# Patient Record
Sex: Female | Born: 1945
Health system: Southern US, Community
[De-identification: ages and names within clinical notes are randomized; demographics above are authoritative.]

## PROBLEM LIST (undated history)

## (undated) DIAGNOSIS — D869 Sarcoidosis, unspecified: Secondary | ICD-10-CM

## (undated) DIAGNOSIS — M1990 Unspecified osteoarthritis, unspecified site: Secondary | ICD-10-CM

## (undated) DIAGNOSIS — I2510 Atherosclerotic heart disease of native coronary artery without angina pectoris: Secondary | ICD-10-CM

## (undated) DIAGNOSIS — N289 Disorder of kidney and ureter, unspecified: Secondary | ICD-10-CM

## (undated) DIAGNOSIS — E119 Type 2 diabetes mellitus without complications: Secondary | ICD-10-CM

## (undated) DIAGNOSIS — M810 Age-related osteoporosis without current pathological fracture: Secondary | ICD-10-CM

## (undated) DIAGNOSIS — E785 Hyperlipidemia, unspecified: Secondary | ICD-10-CM

## (undated) DIAGNOSIS — I10 Essential (primary) hypertension: Secondary | ICD-10-CM

## (undated) HISTORY — DX: Hyperlipidemia, unspecified: E78.5

## (undated) HISTORY — PX: ANKLE SURGERY: SHX546

## (undated) HISTORY — DX: Essential (primary) hypertension: I10

## (undated) HISTORY — PX: BACK SURGERY: SHX140

## (undated) HISTORY — PX: FRACTURE SURGERY: SHX138

---

## 1997-12-20 ENCOUNTER — Ambulatory Visit (HOSPITAL_COMMUNITY): Admission: RE | Admit: 1997-12-20 | Discharge: 1997-12-20 | Payer: Self-pay | Admitting: Neurosurgery

## 1997-12-31 ENCOUNTER — Ambulatory Visit (HOSPITAL_COMMUNITY): Admission: RE | Admit: 1997-12-31 | Discharge: 1997-12-31 | Payer: Self-pay | Admitting: Neurosurgery

## 1998-01-28 ENCOUNTER — Inpatient Hospital Stay (HOSPITAL_COMMUNITY): Admission: RE | Admit: 1998-01-28 | Discharge: 1998-01-31 | Payer: Self-pay | Admitting: Neurosurgery

## 1998-02-09 ENCOUNTER — Emergency Department (HOSPITAL_COMMUNITY): Admission: EM | Admit: 1998-02-09 | Discharge: 1998-02-09 | Payer: Self-pay | Admitting: Emergency Medicine

## 1998-10-12 ENCOUNTER — Encounter: Payer: Self-pay | Admitting: Neurosurgery

## 1998-10-12 ENCOUNTER — Ambulatory Visit (HOSPITAL_COMMUNITY): Admission: RE | Admit: 1998-10-12 | Discharge: 1998-10-12 | Payer: Self-pay | Admitting: Neurosurgery

## 2002-04-20 ENCOUNTER — Ambulatory Visit (HOSPITAL_COMMUNITY): Admission: RE | Admit: 2002-04-20 | Discharge: 2002-04-20 | Payer: Self-pay | Admitting: Internal Medicine

## 2002-04-20 ENCOUNTER — Encounter: Payer: Self-pay | Admitting: Internal Medicine

## 2002-05-11 ENCOUNTER — Ambulatory Visit (HOSPITAL_COMMUNITY): Admission: RE | Admit: 2002-05-11 | Discharge: 2002-05-11 | Payer: Self-pay | Admitting: Orthopaedic Surgery

## 2002-05-11 ENCOUNTER — Encounter: Payer: Self-pay | Admitting: Orthopaedic Surgery

## 2002-07-02 ENCOUNTER — Encounter (HOSPITAL_COMMUNITY): Admission: RE | Admit: 2002-07-02 | Discharge: 2002-08-01 | Payer: Self-pay | Admitting: Orthopaedic Surgery

## 2003-07-19 ENCOUNTER — Ambulatory Visit (HOSPITAL_COMMUNITY): Admission: RE | Admit: 2003-07-19 | Discharge: 2003-07-19 | Payer: Self-pay | Admitting: Internal Medicine

## 2003-09-14 ENCOUNTER — Ambulatory Visit (HOSPITAL_COMMUNITY): Admission: RE | Admit: 2003-09-14 | Discharge: 2003-09-14 | Payer: Self-pay | Admitting: Internal Medicine

## 2004-04-20 ENCOUNTER — Inpatient Hospital Stay (HOSPITAL_COMMUNITY): Admission: EM | Admit: 2004-04-20 | Discharge: 2004-05-12 | Payer: Self-pay | Admitting: Emergency Medicine

## 2004-05-14 ENCOUNTER — Inpatient Hospital Stay (HOSPITAL_COMMUNITY): Admission: EM | Admit: 2004-05-14 | Discharge: 2004-05-19 | Payer: Self-pay

## 2004-09-22 ENCOUNTER — Other Ambulatory Visit: Admission: RE | Admit: 2004-09-22 | Discharge: 2004-09-22 | Payer: Self-pay | Admitting: Obstetrics and Gynecology

## 2004-10-05 ENCOUNTER — Ambulatory Visit (HOSPITAL_COMMUNITY): Admission: RE | Admit: 2004-10-05 | Discharge: 2004-10-05 | Payer: Self-pay | Admitting: Internal Medicine

## 2005-02-21 ENCOUNTER — Ambulatory Visit: Payer: Self-pay | Admitting: Internal Medicine

## 2005-03-05 ENCOUNTER — Ambulatory Visit (HOSPITAL_COMMUNITY): Admission: RE | Admit: 2005-03-05 | Discharge: 2005-03-05 | Payer: Self-pay | Admitting: Internal Medicine

## 2005-03-05 ENCOUNTER — Ambulatory Visit: Payer: Self-pay | Admitting: Internal Medicine

## 2005-08-28 ENCOUNTER — Inpatient Hospital Stay (HOSPITAL_COMMUNITY): Admission: EM | Admit: 2005-08-28 | Discharge: 2005-09-01 | Payer: Self-pay | Admitting: Emergency Medicine

## 2005-08-30 ENCOUNTER — Ambulatory Visit: Payer: Self-pay | Admitting: *Deleted

## 2005-08-31 ENCOUNTER — Encounter (INDEPENDENT_AMBULATORY_CARE_PROVIDER_SITE_OTHER): Payer: Self-pay | Admitting: *Deleted

## 2005-08-31 ENCOUNTER — Encounter: Payer: Self-pay | Admitting: Thoracic Surgery (Cardiothoracic Vascular Surgery)

## 2005-09-10 ENCOUNTER — Encounter
Admission: RE | Admit: 2005-09-10 | Discharge: 2005-09-10 | Payer: Self-pay | Admitting: Thoracic Surgery (Cardiothoracic Vascular Surgery)

## 2005-09-13 ENCOUNTER — Ambulatory Visit (HOSPITAL_COMMUNITY): Admission: RE | Admit: 2005-09-13 | Discharge: 2005-09-13 | Payer: Self-pay | Admitting: Internal Medicine

## 2005-10-09 ENCOUNTER — Ambulatory Visit (HOSPITAL_COMMUNITY): Admission: RE | Admit: 2005-10-09 | Discharge: 2005-10-09 | Payer: Self-pay | Admitting: Internal Medicine

## 2005-12-11 ENCOUNTER — Ambulatory Visit (HOSPITAL_COMMUNITY): Admission: RE | Admit: 2005-12-11 | Discharge: 2005-12-11 | Payer: Self-pay | Admitting: Pulmonary Disease

## 2005-12-13 ENCOUNTER — Ambulatory Visit (HOSPITAL_COMMUNITY): Admission: RE | Admit: 2005-12-13 | Discharge: 2005-12-13 | Payer: Self-pay | Admitting: Pulmonary Disease

## 2006-09-06 ENCOUNTER — Emergency Department (HOSPITAL_COMMUNITY): Admission: EM | Admit: 2006-09-06 | Discharge: 2006-09-06 | Payer: Self-pay | Admitting: Emergency Medicine

## 2006-09-13 ENCOUNTER — Emergency Department (HOSPITAL_COMMUNITY): Admission: EM | Admit: 2006-09-13 | Discharge: 2006-09-13 | Payer: Self-pay | Admitting: Emergency Medicine

## 2006-09-17 ENCOUNTER — Ambulatory Visit (HOSPITAL_BASED_OUTPATIENT_CLINIC_OR_DEPARTMENT_OTHER): Admission: RE | Admit: 2006-09-17 | Discharge: 2006-09-18 | Payer: Self-pay | Admitting: Orthopaedic Surgery

## 2006-10-01 ENCOUNTER — Ambulatory Visit (HOSPITAL_BASED_OUTPATIENT_CLINIC_OR_DEPARTMENT_OTHER): Admission: RE | Admit: 2006-10-01 | Discharge: 2006-10-02 | Payer: Self-pay | Admitting: Orthopaedic Surgery

## 2007-03-04 ENCOUNTER — Ambulatory Visit (HOSPITAL_COMMUNITY): Admission: RE | Admit: 2007-03-04 | Discharge: 2007-03-04 | Payer: Self-pay | Admitting: Pulmonary Disease

## 2007-06-02 ENCOUNTER — Other Ambulatory Visit: Admission: RE | Admit: 2007-06-02 | Discharge: 2007-06-02 | Payer: Self-pay | Admitting: Orthopaedic Surgery

## 2007-06-04 ENCOUNTER — Ambulatory Visit (HOSPITAL_COMMUNITY): Admission: RE | Admit: 2007-06-04 | Discharge: 2007-06-04 | Payer: Self-pay | Admitting: Obstetrics & Gynecology

## 2007-06-11 ENCOUNTER — Other Ambulatory Visit: Admission: RE | Admit: 2007-06-11 | Discharge: 2007-06-11 | Payer: Self-pay | Admitting: Obstetrics and Gynecology

## 2007-11-25 ENCOUNTER — Ambulatory Visit (HOSPITAL_COMMUNITY): Admission: RE | Admit: 2007-11-25 | Discharge: 2007-11-25 | Payer: Self-pay | Admitting: Pulmonary Disease

## 2008-03-04 ENCOUNTER — Ambulatory Visit (HOSPITAL_COMMUNITY): Admission: RE | Admit: 2008-03-04 | Discharge: 2008-03-04 | Payer: Self-pay | Admitting: Pulmonary Disease

## 2008-05-26 ENCOUNTER — Ambulatory Visit (HOSPITAL_COMMUNITY): Admission: RE | Admit: 2008-05-26 | Discharge: 2008-05-26 | Payer: Self-pay | Admitting: Pulmonary Disease

## 2008-06-22 ENCOUNTER — Other Ambulatory Visit: Admission: RE | Admit: 2008-06-22 | Discharge: 2008-06-22 | Payer: Self-pay | Admitting: Obstetrics and Gynecology

## 2009-04-26 ENCOUNTER — Ambulatory Visit (HOSPITAL_COMMUNITY): Admission: RE | Admit: 2009-04-26 | Discharge: 2009-04-26 | Payer: Self-pay | Admitting: Pulmonary Disease

## 2009-06-10 ENCOUNTER — Ambulatory Visit (HOSPITAL_COMMUNITY): Admission: RE | Admit: 2009-06-10 | Discharge: 2009-06-10 | Payer: Self-pay | Admitting: Pulmonary Disease

## 2009-06-17 ENCOUNTER — Ambulatory Visit (HOSPITAL_COMMUNITY): Admission: RE | Admit: 2009-06-17 | Discharge: 2009-06-17 | Payer: Self-pay | Admitting: Pulmonary Disease

## 2009-07-14 ENCOUNTER — Other Ambulatory Visit: Admission: RE | Admit: 2009-07-14 | Discharge: 2009-07-14 | Payer: Self-pay | Admitting: Obstetrics and Gynecology

## 2010-06-22 ENCOUNTER — Ambulatory Visit (HOSPITAL_COMMUNITY)
Admission: RE | Admit: 2010-06-22 | Discharge: 2010-06-22 | Payer: Self-pay | Source: Home / Self Care | Admitting: Pulmonary Disease

## 2010-09-16 ENCOUNTER — Encounter: Payer: Self-pay | Admitting: Pulmonary Disease

## 2010-11-30 LAB — BLOOD GAS, ARTERIAL
Acid-base deficit: 5.2 mmol/L — ABNORMAL HIGH (ref 0.0–2.0)
Bicarbonate: 19.2 mEq/L — ABNORMAL LOW (ref 20.0–24.0)
O2 Saturation: 96.2 %
Patient temperature: 37
TCO2: 17.6 mmol/L (ref 0–100)
pCO2 arterial: 34.5 mmHg — ABNORMAL LOW (ref 35.0–45.0)
pH, Arterial: 7.364 (ref 7.350–7.400)
pO2, Arterial: 86.2 mmHg (ref 80.0–100.0)

## 2011-01-12 NOTE — Procedures (Signed)
NAMENANCI, LAKATOS NO.:  [id number]   MEDICAL RECORD NO.:  [id number]          PATIENT TYPE:  OUT   LOCATION:  RESP                          FACILITY:  [facility]   PHYSICIAN:  Edward L. Juanetta Gosling, M.D.DATE OF BIRTH:  04-27-1946   DATE OF PROCEDURE:  DATE OF DISCHARGE:  12/13/2005                              PULMONARY FUNCTION TEST   1.  Spirometry shows a moderate ventilatory defect with evidence of airflow      obstruction.  2.  Lung volumes are normal.  3.  DLCO is normal.  4.  There is no significant bronchodilator response. Since the previous      pulmonary function test, lung volumes have decreased slightly although      they are still in the normal range.  She has had some reduction in the      FEV1 and FVC, although they are still approximately as previously, and      she has actually had an improvement in DLCO.      Edward L. Juanetta Gosling, M.D.  Electronically Signed     ELH/MEDQ  D:  12/15/2005  T:  12/17/2005  Job:  [id number]   cc:   Tesfaye D. [name], MD  Fax: [phone]

## 2011-01-12 NOTE — Discharge Summary (Signed)
[name], [name]                 ACCOUNT NO.:  [id number]   MEDICAL RECORD NO.:  [id number]          PATIENT TYPE:  INP   LOCATION:  A206                          FACILITY:  [facility]   PHYSICIAN:  Tesfaye D. [name], MD   DATE OF BIRTH:  Nov 27, 1945   DATE OF ADMISSION:  05/14/2004  DATE OF DISCHARGE:  09/23/2005LH                                 DISCHARGE SUMMARY   DISCHARGE DIAGNOSES:  1.  Postoperative nausea and vomiting, etiology not clear.  2.  Diarrhea.  3.  Acute on chronic renal failure.  4.  History of perforated duodenal ulcer and status post exploratory      laparotomy.  5.  History of sepsis.  6.  History of postoperative anemia.  7.  Hypertension.  8.  Diabetes mellitus.   DISCHARGE MEDICATIONS:  1.  Phenergan 25 mg suppository q.6h. p.r.n.  2.  Protonix 40 mg p.o. daily.  3.  Lortab 5/500 one tablet p.o. q.6h. p.r.n.  4.  Norvasc 5 mg p.o. daily.  5.  Avandia 4 mg p.o. daily.  6.  Glucotrol 2.5 mg p.o. daily.  7.  Xanax 0.5 mg p.o. b.i.d.  8.  Lisinopril/HCT 10/12.5 one tablet p.o. daily.   DISPOSITION:  Patient was discharged home in stable condition.   HOSPITAL COURSE:  This is a 65 year old female patient with a history of  multiple medical illnesses who was discharged recently from [facility].  There she had a complicated course following perforated duodenal  ulcer.  Was brought back due to nausea and vomiting and diarrhea of two  days' duration.  Patient was admitted and was started on IV fluids.  GI  consult was done and advised to continue on conservative treatment.  Her C.  difficile toxins became negative.  Patient was also seen by nephrologist for  her renal failure.  Patient was treated with symptomatic medications.  Over  the hospital stay her symptoms gradually improved and she was discharged  back home to continue her regular medications.     Tesf   TDF/MEDQ  D:  06/13/2004  T:  06/13/2004  Job:  [id number]

## 2011-01-12 NOTE — Consult Note (Signed)
[name], [name] NO.:  [id number]   MEDICAL RECORD NO.:  [id number]          PATIENT TYPE:  INP   LOCATION:  2023                         FACILITY:  [facility]   PHYSICIAN:  Salvatore Decent. Cornelius Moras, M.D. DATE OF BIRTH:  1946-03-30   DATE OF CONSULTATION:  08/30/2005  DATE OF DISCHARGE:                                   CONSULTATION   REQUESTING PHYSICIAN:  Dr. Kari Baars.   PRIMARY CARE PHYSICIAN:  Dr. Ninetta Lights D. Fanta.   REASON FOR CONSULTATION:  Mediastinal and bilateral hilar lymphadenopathy.   HISTORY OF PRESENT ILLNESS:  [name] is a 65 year old obese African-  American female from [place] with a history of hypertension, type 2  diabetes mellitus, mitral valve prolapse, peptic ulcer disease, and chronic  low back pain. The patient otherwise has no previous cardiac history and the  patient is a nonsmoker. She was admitted to [facility] on August 28, 2005 with a three-day history of worsening atypical chest pain. The  patient describes dull crushing substernal chest pain that she describes as  though an elephant is sitting on her chest. She states that the pain does  not seem to be related to physical activity or stress. The pain is not  associated with shortness of breath. The pain is worse by taking deep  breaths. The patient denies exertional shortness of breath and fatigue. The  patient denies productive cough or dry cough. She has not had hemoptysis.  She denies fever. The pain is only relieved in the patient's report by  morphine.   [name] was admitted to [facility] on August 28, 2005. Admission  set of cardiac enzymes and 12-lead electrocardiogram were reportedly  negative. She underwent chest CT scan that revealed a bulky mediastinal and  bilateral hilar lymphadenopathy. She also had a few tiny scattered bilateral  pulmonary nodules. There is some mild atelectasis in the right midlung but  otherwise no significant pulmonary  parenchymal disease. There were no  pleural effusions. Thoracic surgical consultation was requested to consider  mediastinoscopy for definitive biopsy. The patient was offered an outpatient  office appointment this afternoon, but due to continued symptoms of severe  chest pain, the patient was felt not to be stable enough to be discharge  from the hospital and instead was transferred from [facility] to  [facility] for further management and therapy.   REVIEW OF SYSTEMS:  GENERAL:  The patient reports normal appetite. She has  not been gaining or losing weight recently. CARDIAC:  The patient denies  exertional chest discomfort, chest pressure or chest pain or shortness of  breath. The chest pain that she describes recently has come on only over the  last five days and does not seem to be related to activity or stress. The  pain is made worse by deep inspiration. The pain is not associated with  difficulty swallowing or symptoms of reflux. The patient denies  palpitations, PND, orthopnea, or lower extremity edema. RESPIRATORY:  Notable for the absence of any cough either productive or nonproductive. The  patient  denies hemoptysis. The patient denies wheezing. The patient denies  recent exposure to any family members or acquaintances with unusual  respiratory illnesses or tuberculosis. GASTROINTESTINAL:  Negative. The  patient reports no ongoing difficulty swallowing. She has longstanding  history of reflux as well as history of a perforated peptic ulcer in 2005  for which she underwent exploratory abdominal surgery and developed severe  sepsis and required prolonged hospitalization to recover. The patient states  that her ongoing symptoms are not at all similar to the symptoms she  suffered at that time. The patient reports normal bowel function. She denies  recent hematochezia, hematemesis, melena. GENITOURINARY:  Negative.  MUSCULOSKELETAL:  Notable for chronic low back pain. The  patient states that  this pain has not changed in recent weeks or months. NEUROLOGIC:  Negative.  The patient denies symptoms suggestive of recent TIA or stroke. INFECTIOUS:  Negative. The patient denies recent fevers, chills. ENDOCRINE:  Notable for  type 2 diabetes mellitus. The patient claims check her sugars regularly and  that they have been under good control. She does not use insulin.  PSYCHIATRIC:  Negative.   PAST MEDICAL HISTORY:  1.  Hypertension.  2.  Type 2 diabetes mellitus.  3.  Status post perforated duodenal ulcer requiring exploratory laparotomy.  4.  Acute renal failure secondary to sepsis in the past.  5.  Mitral valve prolapse.  6.  Chronic low back pain.  7.  GE reflux disease.   PAST SURGICAL HISTORY:  Exploratory laparotomy for perforated duodenal ulcer  and/or esophagus according to the patient in 2005.   CURRENT MEDICATIONS:  1.  Protonix 40 milligrams daily.  2.  Norvasc 5 milligrams daily.  3.  Glipizide 5 milligrams daily.  4.  Avandia 4 milligrams daily.  5.  Reglan 10 milligrams three times daily before meals and also a fourth      time at bed time.  6.  Lisinopril/HCTZ 10/12.5, 1 tablet daily.  7.  Lyrica dose unclear.  8.  Lanoxin, dose unclear. In the patient's transcribed history and physical      from [facility], the patient reportedly takes Lanoxin 0.5      milligrams three times daily.   DRUG ALLERGIES:  The patient was told never to take ASPIRIN again secondary  to her perforated duodenal ulcer. The patient also apparently has  sensitivity to DARVOCET.   SOCIAL HISTORY:  The patient is retired and married and lives with her  husband in [place]. The patient denies any history of tobacco use. The  patient denies history of alcohol or other substance abuse.   FAMILY HISTORY:  Notable for the absence of any family members with history  of lung cancer, lymphoproliferative disorder, sarcoidosis, atypical infection. The patient  also denies any recent history of travel to foreign  countries or to the [place].   PHYSICAL EXAM:  The patient is well-appearing, mildly obese African-American  female who appears her stated age in no acute distress. Blood pressure is  142/88, temperature 99 degrees Fahrenheit, pulse 90. She is in sinus rhythm.  HEENT exam is grossly unremarkable. There is no palpable cervical or  supraclavicular lymphadenopathy. There is no jugular venous distension.  Auscultation of chest reveals clear and symmetrical breath sounds  bilaterally. No wheezes or rhonchi noted. Cardiovascular exam includes  regular rate and rhythm. No murmurs, rubs or gallops are noted. The abdomen  is soft and nontender. Bowel sounds are present. There are no palpable  masses.  Extremities are warm and well-perfused. There is no lower extremity  edema. Distal pulses are not palpable in either lower leg at the ankle.  There is no sign of significant venous insufficiency. Rectal and GU exams  are both deferred. Neurologic examination is grossly nonfocal and  symmetrical throughout.   DIAGNOSTIC TEST:  Chest CT scan performed August 28, 2005 at [facility] is reviewed. This demonstrates bulky mediastinal and bilateral  hilar lymphadenopathy. There are a few tiny scattered pulmonary nodules in  both lung fields. There are no pleural effusions. There is slight amount of  atelectasis in the right middle lung field. No other significant  abnormalities were noted. Abdominal and pelvis CT scan was also reviewed.  There is a tiny small lesion posteriorly in the liver which may be a tiny  hepatic cyst, although it is so small it is difficult to ascertain. No other  significant abnormalities are noted.   IMPRESSION:  1.  Bilateral hilar and mediastinal lymphadenopathy:  I am most suspicious      of possible sarcoidosis. Other possible diagnoses include lung cancer or      possible hematological  malignancy or lymphoproliferative disorder.      Perhaps less likely some type of atypical fungal infection or      mycobacterial infection.  2.  Atypical chest pain:  I do not believe this chest pain is cardiac in      origin. The chest pain seems to be disproportionate to the patient's      clinical condition. It may or may not be related to the underlying cause      of the lymphadenopathy.   PLAN:  I have discussed issues at length with Ms. Lessner and her husband. I  believe that bronchoscopy and mediastinoscopy would be the most reasonable  approach to proceed with definitive diagnostic intervention with respect to  the mediastinal lymphadenopathy. This will certainly in no way effect or  eliminate the symptoms of pain the patient is experiencing. I doubt that her  pain is cardiac in origin, but the patient certainly has multiple risk  factors and as well she will require general anesthetic for surgery.  I have discussed at length the indications, risks, and potential benefits of  bronchoscopy, mediastinoscopy with Ms. Savo and her husband. We tentatively  plan to proceed with surgery tomorrow. We will ask Dr. Dorethea Clan from the  [facility] Cardiology team to see her to make sure that he agrees that her  risks with a general anesthetic are acceptably low. The patient and her  husband accept all associated risks of surgery including but not limited to  risk of death, stroke, myocardial infarction, respiratory failure,  pneumonia, bleeding requiring blood transfusion or possible urgent  thoracotomy, possible transient or permanent change of voice related to  injury to her recurrent laryngeal nerve. All their questions have been  addressed.      Salvatore Decent. Cornelius Moras, M.D.  Electronically Signed     CHO/MEDQ  D:  08/30/2005  T:  08/30/2005  Job:  [id number]   cc:   Ramon Dredge L. Juanetta Gosling, M.D.  Fax: [phone]   Tesfaye D. [name], MD  Fax: [phone]

## 2011-01-12 NOTE — Consult Note (Signed)
[name], [name]                 ACCOUNT NO.:  [id number]   MEDICAL RECORD NO.:  [id number]          PATIENT TYPE:  INP   LOCATION:  A206                          FACILITY:  [facility]   PHYSICIAN:  R. Roetta Sessions, M.D. DATE OF BIRTH:  Jan 13, 1946   DATE OF CONSULTATION:  05/17/2004  DATE OF DISCHARGE:                                   CONSULTATION   REASON FOR CONSULTATION:  __________.   HISTORY OF PRESENT ILLNESS:  The patient is a 65 year old black female  __________ with history of prolonged hospital stay due to her perforated  duodenal ulcer status post exploratory laparotomy complicated by acute renal  failure, DIC __________.  She was readmitted on May 14, 2004 after  being home for two to three days with complaints of nausea, vomiting, and  diarrhea.  She was unable to maintain oral intake.  She had vomited on  numerous occasions. She last vomited, however, roughly four days ago but  continues to have persistent nausea despite IV Protonix and IV Reglan.  She  also complains of two to three loose to watery stools daily.  No melena or  rectal bleeding noted.  Denies any abdominal pain.  Prior to admission, she  was having some mild suprapubic discomfort after urinating.  She has low  back pain.  Heartburn symptoms are well controlled on Protonix.   Clostridium difficile and stool cultures are pending.  Current hemoglobin is  9.8, hematocrit 28.3, WBC 7.8. BUN 18, creatinine 3.4.  Amylase 205 and  lipase 178.  Notably her lipase was 56 on admission on April 20, 2004.  Gradually climbed up to 518 by May 01, 2004.  On May 15, 2004 she  had acute abdominal films which revealed positive bowel gas pattern.  On  May 04, 2004, upper GI series revealed slight deformity of the duodenal  bulb, suspected postoperatively.  No contrast extravasation or gastric  outlet obstruction.   MEDICATIONS PRIOR TO ADMISSION:  1.  Glucotrol 5 mg daily.  2.  Avandia 4 mg daily.  3.  Demadex __________ daily.  4.  Protonix 40 mg daily.  5.  Lortab 5/500 mg q.6h.  6.  Potassium chloride 40 mEq daily.  7.  Levaquin 750 mg daily.  8.  __________ q.i.d.  9.  Norvasc 5 mg daily.   ALLERGIES:  SULFA AND DARVOCET __________.   PAST MEDICAL HISTORY:  1.  Mitral valve prolapse.  2.  Hypertension.  3.  Diabetes mellitus type 2.  4.  Postoperative anemia.  5.  Recent perforated duodenal ulcer, status post exploratory laparotomy      requiring prolonged hospital stay, very complicated by acute renal      failure requiring dialysis.  6.  Disseminated intravascular coagulation which did resolve   PAST SURGICAL HISTORY:  1.  As above.  2.  Lumbar back fusion.  3.  Tubal ligation.  4.  ___________.   SOCIAL HISTORY:  She is married and has two sons.  She is retired.  __________.   REVIEW OF SYSTEMS:  GI:  See HPI.  GU:  Denies  any dysuria.  CARDIOPULMONARY:  Denies any chest pain or shortness of breath.   PHYSICAL EXAMINATION:  VITAL SIGNS:  Weight __________.  NECK:  No lymphadenopathy, thyromegaly.  __________.   However, I do feel this is less likely especially since her symptoms are  persistent on IV Reglan.  I suspect her elevated amylase and lipase are  secondary to her recent perforated duodenal ulcer rather than pancreatitis.  She really is not having any left upper quadrant abdominal pain or  epigastric pain.  She is at high risk for Clostridium difficile colitis.   RECOMMENDATIONS:  1.  Will recheck LFTs and lipase.  2.  Follow up Clostridium difficile and stool cultures.  3.  Will discuss further with Dr. Jena Gauss.  4.  Further recommendations to follow.   I would like to thank Dr. [name] for allowing [place] to take part in the care of  this patient.      LL/MEDQ  D:  05/17/2004  T:  05/17/2004  Job:  [id number]   cc:   Tesfaye D. [name], M.D.  [address]  [place]  [place] [address]  Fax: [phone]

## 2011-01-12 NOTE — Consult Note (Signed)
[name], [name] NO.:  [id number]   MEDICAL RECORD NO.:  [id number]          PATIENT TYPE:  INP   LOCATION:  2023                         FACILITY:  [facility]   PHYSICIAN:  Vida Roller, M.D.   DATE OF BIRTH:  [birth year]   DATE OF CONSULTATION:  08/30/2005  DATE OF DISCHARGE:                                   CONSULTATION   REASON FOR CONSULTATION:  Chest pain.   [name] is a 65 year old female, with no known history of coronary artery  disease, who recently presented to [facility] and is now  transferred to Dr. Lemont Fillers service for further diagnostic  evaluation. She was found to have large, bulky, mediastinal, hilar,  adenopathy by chest CT scanning and is scheduled to undergo mediastinoscopy  and biopsy. We are now asked to consult regarding complaints of chest pain.   The patient presents with a one week history of persistent chest pain which  is pleuritic in nature per her history. There is no exercise component nor  any associated dyspnea. The patient is able to climb two flights of stairs,  in fact with only some mild associated dyspnea.   An admission electrocardiogram revealed a normal sinus rhythm with  nonspecific ST abnormalities in the precordial leads.   The patient presents with no history of paroxysmal nocturnal dyspnea, lower  extremity edema, or tachypalpitations.   ALLERGIES:  SULFA, DARVOCET, AND ASPIRIN.   MEDICATIONS PRIOR TO ADMISSION:  1.  Protonix.  2.  Norvasc 5 mg daily.  3.  Glipizide 5 mg daily.  4.  Avandia 4 mg daily.  5.  Reglan 10 a.c./h.s.  6.  Lisinopril/hydrochlorothiazide 10/12.5 mg daily.  7.  Lyrica 7.5/650 mg q.8h. p.r.n.  8.  Lorcet 0.5 mg t.i.d.   PAST MEDICAL HISTORY:  1.  Perforated duodenal ulcer.      1.  Status post exploratory laparotomy, August 2005.      2.  Postoperative acute renal failure secondary to sepsis (requiring          temporary dialysis), disseminated intravascular  coagulation, and          thrombocytopenia.  2.  Hypertension.  3.  Type 2 diabetes mellitus.  4.  History of mitral valve prolapse.  5.  Gastroesophageal reflux disease.  6.  Degenerative joint disease.      1.  Status post spinal fusion (times four).  7.  Status post tubal ligation.  8.  Status post cataract surgery.  9.  History of hematochezia.      1.  Secondary to external hemorrhoids.   SOCIAL HISTORY:  The patient denies any history of alcohol, tobacco, or  illicit drug use.   FAMILY HISTORY:  Negative for premature coronary artery disease.   REVIEW OF SYSTEMS:  As noted per HPI.  In addition, the patient denies any  recent fever, chills, sweats, or headache. She denies any evidence of upper  or lower GI bleeding. She has chronic back pain. Otherwise, as noted per  HPI.  The remaining symptoms negative.   PHYSICAL EXAMINATION:  VITAL  SIGNS: Blood pressure 142/88, temperature 99,  pulse 90, respirations 18.  GENERAL: A 65 year old female, well-nourished, well-developed, mildly obese,  in no apparent distress.  HEENT: Normocephalic and atraumatic.  NECK: Preserved bilateral carotid pulses without bruits; no JVD.  LUNGS: Diminished breath sounds at the bases.  HEART: Regular rate and rhythm (S1 and S2). No murmurs, rubs, or gallops.  ABDOMEN: Soft, nontender with no hepatomegaly. Intact bowel sounds.  EXTREMITIES: Intact bilateral carotid pulses with trace pedal edema.  NEUROLOGIC: No focal deficits.   Chest CT scan [facility]): Extensive mediastinal/bilateral hilar adenopathy;  multiple pulmonary nodules; question metastatic disease.   Electrocardiogram: Normal sinus rhythm at 90 BPM with normal axis;  nonspecific ST abnormalities with small T-wave inversions in leads V1  through V4 and ST flattening in leads V5 and V6.   Laboratory data [facility]) on January 2nd: Hemoglobin 11.7, hematocrit 34.8 (MCV  85), WBC 5.6, platelet count 278,000. Sodium 139, potassium 3.4, BUN 12,   creatinine 0.9, glucose 137. MB less than 1.0. Troponin-I less than 0.05.  CEA level less than 0.5.   IMPRESSION:  1.  Chest pain: The patient's symptoms are extremely atypical for coronary      artery disease. Electrocardiogram is mildly abnormal, but it is not      clear if these are chronic changes. Serial cardiac markers were negative      two days ago and the patient has been having ongoing, persistent chest      pain for approximately one week.  2.  Abnormal electrocardiogram; recommendation is to check a 2-D      echocardiogram given the unknown chronicity of the electrocardiogram.      However, it does not appear ischemic.   PLAN:  Recommendation is to cycle cardiac markers and check a 2-D  echocardiogram in the morning. We feel that the patient is at low risk for  perioperative cardiac events. The patient is scheduled to undergo  bronchoscopy  and  mediastinoscopy tomorrow. We feel that the patient is at low risk for  perioperative cardiac events. We will, however, cycle cardiac markers and  schedule a 2-D echocardiogram for assessment of LV function and to rule out  any structural abnormalities.      Gene Serpe, P.A. [facility]      Vida Roller, M.D.  Electronically Signed    GS/MEDQ  D:  08/30/2005  T:  08/30/2005  Job:  [id number]

## 2011-01-12 NOTE — Op Note (Signed)
NAMEVANNAH, [name] NO.:  [id number]   MEDICAL RECORD NO.:  [id number]          PATIENT TYPE:  AMB   LOCATION:  DSC                          FACILITY:  [facility]   PHYSICIAN:  Claude Manges. Whitfield, M.D.DATE OF BIRTH:  20-Jun-1946   DATE OF PROCEDURE:  10/01/2006  DATE OF DISCHARGE:                               OPERATIVE REPORT   PREOPERATIVE DIAGNOSIS:  Failed open reduction and internal fixation  bimalleolar fracture, right ankle, with loss of reduction.   POSTOPERATIVE DIAGNOSIS:  Failed open reduction and internal fixation  bimalleolar fracture, right ankle, with loss of reduction.   PROCEDURE:  Removal of internal fixation devices medial and lateral  right ankle with repeat open reduction and internal fixation and bone  grafting.   SURGEON:  Claude Manges. Cleophas Dunker, M.D.   ASSISTANT:  [name], [name].   ANESTHESIA:  General.   ESTIMATED BLOOD LOSS:  Approximately 100 mL.   COMPLICATIONS:  None.   HISTORY:  65 year old diabetic with peripheral neuropathy underwent open  reduction and internal fixation of a displaced bimalleolar fracture  several weeks ago. She was seen postoperatively in the office for the  first visit with wounds intact but x-rays revealing complete loss of  reduction with extrusion of the plate and screws from the bone laterally  and the lateral position of the talus in the ankle mortise. She is now  to have an exploration of her ankle and repeat internal fixation.   DESCRIPTION OF PROCEDURE:  With the patient comfortable on the operating  table and under general orotracheal anesthesia, the right lower  extremity was placed in a thigh tourniquet. The patient's staples had  been removed in the office within the past week and the wounds were  clean.  The leg was then prepped with DuraPrep from the tips of the toes  to the knee.  Sterile draping was performed.  With the extremity still  elevated, it was Esmarch exsanguinated with a  proximal tourniquet at 325  mmHg.   The previous lateral malleolar incision was utilized and via sharp  dissection, carried through the subcutaneous tissue.  Then, by blunt  dissection, the soft tissue was elevated off the distal fibular.  The  plate was visible.  Five of the screws had backed out so these were  simply removed. The others were just unscrewed, thus removing the plate,  we lost further reduction of the fracture.  The fracture was long,  oblique, extending from posterior proximally to anterior distally. Under  direct visualization, the fracture was reduced but the bone was very  soft.  We elected to use an 11 hole DePuy ACE fibular plate. This was  then contoured to the fibula with the bending plates and then applied  with bone clamps. At that point, we had maintained reduction of the  ankle mortise and the fracture.  The proximal five holes were drilled,  measured, and filled with self-tapping cortical screws.  We had  excellent purchase.  The distal two holes were then drilled, measured  and filled with the self-tapping fully threaded 4.0 cancellus screws,  and I had very good purchase and bite on those, as well.   The bone beneath the remaining holes was quite soft and I elected to  place a circumferential FiberWire around the fibula and the plate at two  different locations.  I supplemented the fixation with allograft  cancellus bone chips. In order to further stabilize the distal portion  of the fibular fracture, we inserted an Arthrex FiberWire diastasis  device.  A drill was then placed through the third to the last hole in  the plate.  We actually explored the medial incision so that we could  see that the drill was placed in the appropriate position of the medial  tibia. The drill was then just left in place so that we could repair the  medial malleolus.  The single screw and washer and two K-wires and  FiberWire that had been inserted previously were removed.   The posterior  tibial tendon was not invaginated. There were multiple small fragments  which we were able to line up to our satisfaction and inserted two  parallel K-wires.  We checked it under intensification.  We had  excellent position of the ankle mortise and nice position of the medial  malleolar fragments.  There still appeared to be some bone missing and  then we impacted for further cancellus bone chips into the void, sutured  periosteum over the bone graft, and then did a figure-of-eight FiberWire  around the tips of the K-wires and then through the bone proximally.  Again, we checked the image intensification and thought we had very nice  purchase.  We supplemented the entire internal fixation with a Steinmann  pin through the heel and across the talus into the tibia.  This was  directed under image intensification.  This was inserted to prevent loss  of reduction and migration of the talus of during the healing process  and we expect to remove it in 4-6 weeks.   We thought we had excellent position by intensification in both AP and  lateral projections.  The wounds were then irrigated.  We closed the  wounds in several layers with 3-0 and 2-0 Vicryl, the skin was closed  with skin clips.  A sterile bulky dressing was applied.  We actually  released the tourniquet at 77 minutes and completed the procedure over  the last 30-40 minutes without a tourniquet.  The patient tolerated the  procedure well without complications.   PLAN:  Recovery care, walker, non-weight bearing, Lortab for pain.      Claude Manges. Cleophas Dunker, M.D.  Electronically Signed     PWW/MEDQ  D:  10/01/2006  T:  10/01/2006  Job:  [id number]

## 2011-01-12 NOTE — Op Note (Signed)
[name], [name] NO.:  [id number]   MEDICAL RECORD NO.:  [id number]                   PATIENT TYPE:  INP   LOCATION:  A306                                 FACILITY:  [facility]   PHYSICIAN:  Dirk Dress. Katrinka Blazing, M.D.                DATE OF BIRTH:  02/06/1946   DATE OF PROCEDURE:  DATE OF DISCHARGE:                                 OPERATIVE REPORT   PREOPERATIVE DIAGNOSIS:  Perforated viscus.   POSTOPERATIVE DIAGNOSIS:  Perforated duodenal ulcer with peritonitis.   PROCEDURE:  Exploratory laparotomy with Cheree Ditto patch closure of perforated  duodenal ulcer.   SURGEON:  Dirk Dress. Katrinka Blazing, MD.   DESCRIPTION:  Under general anesthesia, the patient's abdomen was prepped  and draped as a sterile field.  A supraumbilical midline incision was made.  Upon entering the abdomen, a moderate volume of air was encountered.  There  was also a large volume of bilious fluid with some matting of the omentum  and small bowel.  The omentum and small bowel were separated manually, and  multiple loculations of bilious fluid were encountered.  The incision was  extended to the upper midline in the subxiphoid area.  Inspection revealed  an anterior perforation of the first portion of the duodenum immediately  distal to the pylorus.  There was a major collection of pus and bile in the  subhepatic space.  There were also large collections in both subphrenic  spaces.  The peritoneal cavity was summarily irrigated with 8 liters of  saline until the fluid returned clear.  Once this was done, the ulcer was  addressed.  The ulcer was closed transversely with 4 sutures of 2-0 silk.  Once this was done, a patch of omentum was placed over the closure and the  sutures were tied to allow the omentum to adhere.  A 3-0 silk was then used  to attach the omentum around the edges to the duodenum and the distal  stomach.  This was done loosely so as to prevent migration of the patch.  A  JP drain was  placed in the subphrenic space.  All the areas were inspected,  and there were no other loculations noted.  Sponge, needle, instrument, and  blade counts were verified as correct.  The peritoneum and fascia were  closed with #1 Prolene.  The subcutaneous space was irrigated and closed  with 2-0  Monocryl.  The skin was closed with staples.  The drain was secured with 3-0  nylon.  The patient tolerated the procedure well.  She was awakened from  anesthesia without difficulty, transferred to a bed, and taken to the post-  anesthetic care unit for further monitoring.      ___________________________________________  Dirk Dress. Katrinka Blazing, M.D.   LCS/MEDQ  D:  04/21/2004  T:  04/22/2004  Job:  [id number]   cc:   Tesfaye D. [name], M.D.  [address]  [place]  [place] [address]  Fax: [phone]

## 2011-01-12 NOTE — Op Note (Signed)
NAMEBILLY, ROCCO NO.:  [id number]   MEDICAL RECORD NO.:  [id number]          PATIENT TYPE:  AMB   LOCATION:  DSC                          FACILITY:  [facility]   PHYSICIAN:  Claude Manges. Whitfield, M.D.DATE OF BIRTH:  07/06/1946   DATE OF PROCEDURE:  09/17/2006  DATE OF DISCHARGE:  09/13/2006                               OPERATIVE REPORT   PREOPERATIVE DIAGNOSIS:  Displaced bimalleolar fracture, right ankle.   POSTOPERATIVE DIAGNOSIS:  Displaced bimalleolar fracture, right ankle.   PROCEDURE:  Open reduction internal fixation.   SURGEON:  Claude Manges. Cleophas Dunker, M.D.   ASSISTANT:  [name], [name].   ANESTHESIA:  General orotracheal.   COMPLICATIONS:  None.   HISTORY:  65 year old female is over a week status post injury to her  right ankle in which she sustained a displaced trimalleolar fracture  with dislocation.  She was seen in an emergency facility initially where  the dislocation was reduced.  She was placed in a splint and I saw her  approximately a week after her injury with the index office visit.  At  that time the skin was intact.  X-rays revealed the ankle mortise to be  intact but there was displacement of the lateral and medial malleoli.  She is now to have open reduction and internal fixation.   PROCEDURE:  The patient comfortable on the operating table and under  general orotracheal anesthesia, a bump was placed beneath the right hip.  Tourniquet was applied and the leg was then Esmarch exsanguinated with a  proximal tourniquet at 350 mmHg.  Sterile prep of the right lower  extremities then performed with DuraPrep.  Sterile draping was  performed.  The initial procedure was performed on the lateral  malleolus.  A longitudinal incision was made over the distal fibula via  sharp dissection, carried down to the subcutaneous tissue.  There was  immediate old hematoma that was evacuated.  Then via blunt dissection  the soft tissue was  elevated off the fibula.  The fracture was  identified.  It was a long oblique beginning above the joint posteriorly  extending distal to the joint anteriorly.  The bone was very soft with a  history of sarcoidosis and prednisone.  We are able to reduce the  fracture under direct visualization, maintain it with the bone clamp.  We then applied a six-hole semi tubular side plate.  Each of the six  holes were subsequently drilled, measured and filled with self-tapping  screws.  The proximal four holes were filled with cortical screws and  distal two with cancellus screws and again we checked them with image  intensification to be sure that we were not into the joint.  We had good  purchase and on removing the bone clamp, the fracture was not mobile.  The wound was then copiously irrigated with saline solution.  We did  attempt a screw perpendicular the fracture but it was so soft that it  just did not purchase.  We were able to place tendon and bone over the  plate, suture it with 3-0  Vicryl, subcu with same material, skin closed  with skin clips.   An oblique incision was made over the medial malleolus and via sharp  dissection, carried down to subcutaneous tissue and via blunt  dissection, the soft tissue was elevated off the medial malleolus.  The  medial malleolus was fractured just below the level of the joint line  and it was comminuted.  There was a sagittal fracture and there were  multiple small pieces. The posterior tibial tendon had invaginated in  the fracture and so on reducing the tendon, we could reduce the  fracture.  The posterior fragment was fixed with a 4-0 cancellus screw  50 mm in length with a washer and we had good purchase, so we performed  an intensification and felt the screw was in the bone.  The more  anterior fragment was completely displaced and it was so small we could  not insert a screw.  We used two parallel K-wires and then a figure-of-  eight FiberWire  and actually had good purchase.  There were still some  fragments into the joint which we then visualized and then reduced with  suture and the Freer.  At that point, the major fragments were not  mobile.  We felt we had good stability.  The posterior tibial tendon was  intact and not impinged.  The wound was then copiously irrigated with  saline solution.  The joint was inspected.  We could not feel a screw.  We did not see any intra-articular fragments.  There had been some  injury to the articular cartilage in the talus and there were several  small little pieces that we were able to remove with the irrigation.  The bone quality was very poor probably because of prednisone.  We  further irrigated the wound then closed in several layers with 3-0  Vicryl, skin closed with skin clips.  Sterile bulky dressing was applied  followed by posterior splint, tourniquet was deflated with immediate  capillary refill to the toes.   PLAN:  Recovery care, crutches, Percocet for pain.  Office 7 to 10 days.      Claude Manges. Cleophas Dunker, M.D.  Electronically Signed     PWW/MEDQ  D:  09/17/2006  T:  09/17/2006  Job:  [id number]

## 2011-01-12 NOTE — Consult Note (Signed)
NAMEEARLEAN, [name] NO.:  [id number]   MEDICAL RECORD NO.:  [id number]                   PATIENT TYPE:  INP   LOCATION:  IC09                                 FACILITY:  [facility]   PHYSICIAN:  Jorja Loa, M.D.             DATE OF BIRTH:  16-Nov-1945   DATE OF CONSULTATION:  DATE OF DISCHARGE:                                   CONSULTATION   REASON FOR CONSULTATION:  Acute renal insufficiency.   HISTORY OF PRESENT ILLNESS:  [name] is a 65 year old African American with  a past medical history of type 2 diabetes, history of degenerative joint  disease, and history of gastroesophageal reflux disease.   She came to the emergency room with complaints of some nausea, abdominal  pain, and she was found to have pneumoperitoneum.  Because of that,  __________ perforation was entertained.  She went through exploratory  laparotomy.  She was found to have a perforation of duodenal ulcer with  peritonitis.  She underwent closure of the perforation.  After that, the  patient was kept in the ICU where her BUN and creatinine have progressively  increased to present level.  Presently, a consultation is called because the  patient became anuric with creatinine of 4.   The patient denies any previous history of diabetes, diabetic nephropathy,  no diabetic retinopathy, and also no history of renal insufficiency.   PAST MEDICAL HISTORY:  1.  She has a history of diabetes.  2.  History of degenerative joint disease.  3.  History of gastroesophageal reflux disease.  4.  History of also __________ prolapse.  5.  Present history of a perforated duodenal ulcer.   SURGICAL HISTORY:  1.  She had back surgery about four times and she has fusion of her      vertebrae.  2.  She is status post tubal ligation.  3.  Status post also cataract surgery.  4.  Present history of exploratory laparotomy with repair of a duodenal      ulcer with perforation and peritonitis.   SOCIAL HISTORY:  No history of smoking.  No history of alcohol abuse.   ALLERGIES:  She is allergic to SULFA and also to PROPOXYPHENE NAPSYLATE.   MEDICATIONS AT THIS MOMENT:  1.  Ciprofloxacin 400 mg IV q.24h.  2.  D5 normal saline at 400 cc per hour.  3.  Insulin 16 units subcu.  4.  Ativan 0.5 mg IV q.6h.  5.  Reglan 10 mg IV q.6h.  6.  Zofran 4 mg q.8h.  7.  Protonix 40 mg IV q.12h.  8.  She is also on Zosyn 4.5 mg q.24h.  9.  She gets some p.r.n. Tylenol.  10. She is getting potassium 10 mEq.  11. According to her, the patient also used to get steroid as an outpatient.  12. Also, she was on Inderal.   SOCIAL HISTORY:  No history of smoking.  The patient is married.  Her  husband was with her, by bedside.   REVIEW OF SYSTEMS:  She denies any fever, chills, sweating.  She denies  shortness of breath.  At this moment, she says that she has occasional  nausea.  She did not move her bowels, but she has passed some gas today.   PHYSICAL EXAMINATION:  GENERAL:  The patient is alert, in no apparent  distress.  She has NG suction.  VITAL SIGNS:  Blood pressure 133/70.  Temperature is 100.3.  Pulse 100.  Respiratory rate 29.  HEENT:  No conjunctival pallor, nonicteric.  Oral mucosa seems to be  somewhat dry.  CHEST:  Decreased breath sounds, otherwise clear.  No rales, no rhonchi, no  egophony.  HEART:  Exam reveals a regular rate and rhythm, no murmur, no S3.  ABDOMEN:  Seems to be hypoactive.  EXTREMITIES:  She has 1+ edema on the legs, greater on the right.   LABORATORY DATA:  Her white blood cell count is 7.9, hemoglobin 10.4,  hematocrit 30.1, platelets 119.  Hemoglobin 6.7, and hematocrit was 19.1.  Sodium today is 132.  CO2 15.  BUN 33, creatinine 4.  Yesterday, her calcium  was 2.9, and before that, 1.7.  On August 25, her calcium was 1.4, and her  total bilirubin was 1.4.  Her albumin on August 27 was 3.7, calcium 7,  amylase is 32, __________ 329.  Lipase 30.  Blood  culture done on August  __________ is 26.   At this moment, for the whole shift, the patient did not have any urine  output.  On August 27th, yesterday, she had a total of 700.   ASSESSMENT:  1.  Renal insufficiency.  At this moment, it seems to be acute.  No previous      history of renal insufficiency even though the patient has a history of      diabetes.  The etiology for her renal insufficiency could possibly be      ATN versus prerenal syndrome.  However, obstructive uropathy also needs      to be considered as the differential diagnosis.  Presently, her BUN and      creatinine was in acceptable range.  She has a normal potassium.  Even      though she has some edema, she does not have significant sign of fluid      overload.  2.  Perforated duodenal ulcer with peritonitis, status post surgery.  3.  Pneumoperitoneum.  The patient with low-grade fever and white blood cell      count is 7.9.  4.  Anemia, probably related to her GI bleeding.  Her H&H is stable.  5.  Thrombocytopenia, seems to be worsening.  Her platelets are 118,000.      Not sure whether this is a sign of sepsis.  6.  Low CO2.  At this moment, no ABG is done.  Not sure whether this is      secondary to metabolic acidosis or secondary to respiratory alkalosis.      Her CO2 is 16.  7.  History of diabetes.  She is on insulin.  8.  History of __________ prolapse.  She was on Inderal. Presently, she is      not taking any medication.  9.  History of degenerative joint disease with back surgery.   RECOMMENDATIONS:  1.  At this moment, we will possibly continue with hydration.  However,  we      will start her on Lasix drip.  We will check her albumin.  If her      albumin is low, probably will add albumin to it.  We will discontinue      her potassium, and we will follow the patient.  2.  We will do an ultrasound of the kidneys just to rule out obstruction.  3.  Will do UA. 4.  Will follow the patient.       ___________________________________________                                            Jorja Loa, M.D.   BB/MEDQ  D:  04/23/2004  T:  04/23/2004  Job:  [id number]

## 2011-01-12 NOTE — Procedures (Signed)
NAMECEANNA, [name] NO.:  [id number]   MEDICAL RECORD NO.:  [id number]          PATIENT TYPE:  OUT   LOCATION:  RESP                          FACILITY:  [facility]   PHYSICIAN:  Edward L. Juanetta Gosling, M.D.DATE OF BIRTH:  01-13-1946   DATE OF PROCEDURE:  09/13/2005  DATE OF DISCHARGE:                              PULMONARY FUNCTION TEST   RESULTS:  1.  Spirometry shows a mild ventilatory defect with evidence of airflow      obstruction.  2.  Lung volumes are normal.  3.  Diffusion capacity of carbon monoxide (DLCO) is mildly reduced.  4.  Arterial blood gases are normal.  5.  There is no significant bronchodilator effect.      Edward L. Juanetta Gosling, M.D.  Electronically Signed     ELH/MEDQ  D:  09/17/2005  T:  09/18/2005  Job:  [id number]

## 2011-01-12 NOTE — Group Therapy Note (Signed)
[name], [name] NO.:  [id number]   MEDICAL RECORD NO.:  [id number]                   PATIENT TYPE:  INP   LOCATION:  IC08                                 FACILITY:  [facility]   PHYSICIAN:  Edward L. Juanetta Gosling, M.D.             DATE OF BIRTH:  1945/09/12   DATE OF PROCEDURE:  04/29/2004  DATE OF DISCHARGE:                                   PROGRESS NOTE   PROBLEM:  1.  Status post acute ruptured duodenal ulcer surgery.  2.  Renal failure.  3.  DIC apparently resolved.  4.  Probable sepsis.   SUBJECTIVE:  [name] seems mildly confused.  She had been operated on by  Dr. Katrinka Blazing for a perforated viscus.  She says she is feeling okay.  She  denies any pain.  She continues to have fairly significant NG drainage.   OBJECTIVE:  Pulse is 103, blood pressure 110/70, chest is fairly clear  without wheezes.  Her heart is regular.  Abdomen is not examined because she  has had surgery.  She continues to receive total nutrition, Cipro,  Lorazepam, Reglan, Zofran, Protonix, Zosyn, Tylenol, morphine, vitamin K.  Phenergan as needed.   Her labs this morning show phosphorus is 2.1, magnesium 1.7, potassium 3.5,  BUN 56, creatinine of 7.  Glucose 158.  Bilirubin is 3.8.  Albumin is 1.8.  BMET yesterday showed her creatinine was 5.8, so her renal function has  deteriorated slightly.   ASSESSMENT:  She continues to be making some progress postoperatively.  She  is obviously still quite sick.   PLAN:  Continue with current medications and treatments.  We will repeat lab  work, etc., and recheck tomorrow.      ___________________________________________                                            Oneal Deputy Juanetta Gosling, M.D.   Gwenlyn Found  D:  04/29/2004  T:  04/29/2004  Job:  [id number]

## 2011-01-12 NOTE — Consult Note (Signed)
[name], [name]                 ACCOUNT NO.:  [id number]   MEDICAL RECORD NO.:  [id number]          PATIENT TYPE:  INP   LOCATION:  A206                          FACILITY:  [facility]   PHYSICIAN:  R. Roetta Sessions, M.D. DATE OF BIRTH:  10-26-1945   DATE OF CONSULTATION:  05/17/2004  DATE OF DISCHARGE:                                   CONSULTATION   GASTROENTEROLOGY CONSULTATION   REASON FOR CONSULTATION:  Persistent nausea, vomiting, diarrhea.   HISTORY OF PRESENT ILLNESS:  The patient is a 65 year old black female with  history of prolonged hospital stay due to perforated duodenal ulcer status  post exploratory laparotomy complicated by acute renal failure,  postoperative sepsis.  She was readmitted on May 14, 2004 after being  home for two to three days.  She presented with complaints of nausea,  vomiting and diarrhea.  She was unable to maintain oral intake.  She has  vomited on numerous occasions. She last vomited, however, roughly four days  ago but continues to have persistent nausea despite intravenous Protonix and  intravenous Reglan.  She also  complains of two to three loose, watery  stools daily.  No melena or rectal bleeding noted.  She denies any abdominal  pain.  Prior to admission she was having some mild suprapubic discomfort  after urination.  She has low back pain.  Heartburn symptoms are well  controlled on Protonix.   CLINICAL DATA:  Clostridium difficile and stool cultures are pending.  Current hemoglobin 9.8, hematocrit 28.3, white blood cell count 7.8. BUN 18,  creatinine 3.4.  Amylase 205, lipase 178.  Notably her lipase was 56 on  admission on April 20, 2004.  It gradually climbed up to 518 by May 01, 2004.  On May 15, 2004 she had acute abdominal films which revealed  nonspecific bowel gas pattern.  On May 04, 2004 upper gastrointestinal  series revealed slight deformity of the duodenal bulb, expectedly  postoperatively, no contrast  extravasation or gastric outlet obstruction.  Contrast was seen in the duodenum and jejunum.   MEDICATIONS PRIOR TO ADMISSION:  1.  Glucotrol 5 mg daily.  2.  Avandia 4 mg daily.  3.  Demodex 60 mg daily.  4.  Protonix 40 mg daily.  5.  Lortab 5/500 mg q.6h.  6.  Potassium chloride 40 mEq daily.  7.  Levaquin 750 mg daily.  8.  Clindamycin 300 mg q.i.d.  9.  Norvasc 5 mg daily.   ALLERGIES:  SULFA, DARVOCET cause nausea.   PAST MEDICAL HISTORY:  Mitral valve prolapse, hypertension, type 2 diabetes  mellitus, postoperative anemia, recent perforated duodenal ulcer, status  post exploratory laparotomy requiring prolonged hospital stay due to  complications including acute renal failure requiring dialysis, DIC which  has resolved.   PAST SURGICAL HISTORY:  As above.  In addition, lumbar back fusion X4.  Tubal ligation.  Cataract extraction.   FAMILY HISTORY:  Maternal grandmother had colorectal cancer.   SOCIAL HISTORY:  She is married.  She has two sons. She is retired.  Denies  any tobacco or alcohol  use.   REVIEW OF SYMPTOMS:  Please see history of present illness for  GASTROINTESTINAL.  GENITOURINARY:  Denies any dysuria.  CARDIOPULMONARY:  Denies any chest pain or shortness of breath.   PHYSICAL EXAMINATION:  VITAL SIGNS:  Weight 182.8.  Temperature maximum  99.1, pulse 92, respirations 22, blood pressure 132/73.  GENERAL:  Pleasant, well-developed, well-nourished black female in no acute  distress.  Skin warm and dry with no jaundice.  HEENT:  Pupils equal, round, reactive to light.  Conjunctiva pink.  Sclerae  are nonicteric.  Oropharyngeal mucosa moist and pink, no lesions, erythema  or exudates.  NECK:  No lymphadenopathy, thyromegaly.  CHEST:  Lungs are clear to auscultation.  CARDIAC:  Examination reveals regular rate and rhythm, normal S1 and S2.  No  murmurs, rubs or gallops.  ABDOMEN:  Positive bowel sounds.  Soft, nondistended.  She has mild  incisional  tenderness.  No hepatosplenomegaly or masses.  No rebound  tenderness or guarding.  EXTREMITIES:  No edema.   LABORATORY DATA:  As mentioned above.  In addition, today her hemoglobin is  9.8, hematocrit 28.3, white blood cell count 7.8, platelet count 374,000.  Sodium 139, potassium 3.6, BUN 18, creatinine 3.4, glucose 97.  Blood  cultures have no growth x2 days.  Total bilirubin 1.7, alkaline phosphatase  62, SGOT 18, SGPT 12, albumin 3.1.  Lumbar x-rays reveal L4-L5 and L5-S1  fusions from prior surgery.   IMPRESSION:  Patient is a 65 year old lady who has persistent nausea and  diarrhea post complicated hospital course for a perforated duodenal ulcer.  May 04, 2004 upper gastrointestinal series revealed no gastric outlet  obstruction.  She could have postoperative gastroparesis or diabetic  gastroparesis but her symptoms are persistent on intravenous Reglan which  makes this less likely possibility.  I suspect her amylase and lipase are  elevated due to recent perforated duodenal ulcer and surgery rather than  pancreatitis.  She is at high risk for Clostridium difficile colitis, await  results.  She has no symptoms suggestive of biliary etiology.   RECOMMENDATIONS:  1.  Liver function tests, lipase.  2.  Follow up Clostridium difficile and stool cultures.  3.  Check helicobacter pylori serologies.  4.  Will discuss further with Dr. Jena Gauss and provide further recommendations.   We would like to thank Dr. [name] for allowing [place] to take part in the care of  this patient.     LL/MEDQ  D:  05/18/2004  T:  05/18/2004  Job:  [id number]

## 2011-01-12 NOTE — Discharge Summary (Signed)
[name], [name]                 ACCOUNT NO.:  [id number]   MEDICAL RECORD NO.:  [id number]          PATIENT TYPE:  INP   LOCATION:  A201                          FACILITY:  [facility]   PHYSICIAN:  Tesfaye D. [name], MD   DATE OF BIRTH:  09/26/1945   DATE OF ADMISSION:  04/20/2004  DATE OF DISCHARGE:  09/16/2005LH                                 DISCHARGE SUMMARY   DISCHARGE DIAGNOSES:  1.  Perforated jejunal ulcer.  2.  Status post exploratory laparotomy and closure of perforated jejunal      ulcer.  3.  Acute renal failure.  4.  Peritonitis.  5.  Sepsis.  6.  Postoperative anemia.  7.  Disseminated intravascular coagulopathy.  8.  Diabetes mellitus.  9.  Metabolic acidosis.  10. Hypertension.   DISCHARGE MEDICATIONS:  1.  Protonix 40 mg p.o. daily.  2.  Norvasc 5 mg p.o. daily.  3.  Kay-Ciel 40 mEq p.o. daily.  4.  Demadex 60 mg p.o. daily.  5.  Lortab 5/500 mg, one tab p.o. q.6h.  6.  Avandia 4 mg p.o. daily.  7.  Glucotrol XL 5 mg p.o. daily.  8.  Levaquin 750 mg p.o. daily for four days.  9.  Cleocin 300 mg p.o. q.i.d. for four more days.   DISPOSITION:  The patient was discharged home in a stable condition.   HISTORY OF PRESENT ILLNESS:  This is a 65 year old female patient with a  history of hypertension, diabetes mellitus and discogenic disease.  She came  to the emergency room due to constipation and abdominal pain.   HOSPITAL COURSE:  The patient's x-ray showed free air in the peritoneum.  A  surgical consultation was done, and an exploratory laparotomy performed.  The patient was found to have a ruptured jejunal ulcer.  The patient  developed postoperative complication including acute renal failure.  She had  also peritonitis and generalized sepsis.  The patient also developed  postoperative anemia and thrombocytopenia.  As her condition continued to  get worse, the patient developed disseminated intravascular coagulopathy.  The patient was aggressively treated  in the intensive care unit.  She was  started on hemodialysis.  Over her hospital stay the patient's condition was  improved.  Her sepsis was resolved, and the patient's DIC resolved.   DISPOSITION/CONDITION ON DISCHARGE:  The patient improved and she was  discharged home in a stable condition, to be followed as an outpatient.     Tesf   TDF/MEDQ  D:  06/27/2004  T:  06/27/2004  Job:  [id number]

## 2011-01-12 NOTE — H&P (Signed)
[name], GUIZAR                 ACCOUNT NO.:  [id number]   MEDICAL RECORD NO.:  [id number]          PATIENT TYPE:  INP   LOCATION:  IC09                          FACILITY:  [facility]   PHYSICIAN:  Tesfaye D. [name], MD   DATE OF BIRTH:  1946-04-30   DATE OF ADMISSION:  08/28/2005  DATE OF DISCHARGE:  LH                                HISTORY & PHYSICAL   CHIEF COMPLAINT:  Chest pain.   HISTORY OF PRESENT ILLNESS:  This is a black female with history of multiple  medical illnesses.  She came to the emergency room with the above complaint.  The patient developed persistent chest pain which was not relieved with  nitroglycerin or rest.  She was evaluated in the emergency room.  Her  cardiac enzymes and EKG were negative for acute ischemia.  Her chest x-ray  showed right middle lobe atelectasis or infiltrate.  The patient has had  mediastinal embolic lymphadenopathy.  CT scan of the chest was done, and it  showed also multiple nodules, probably metastatic.  The patient was then  admitted for further evaluation.   REVIEW OF SYSTEMS:  No fever or chills, nausea, vomiting, abdominal pain,  dysuria, urgency, or hesitancy of urination.   PAST MEDICAL HISTORY:  1.  Diabetes mellitus.  2.  Status post perforated duodenal ulcer.  3.  Status post exploratory laparotomy.  4.  History of acute renal failure.  5.  Hypertension.  6.  DIC.  7.  History of mitral valve prolapse.  8.  Chronic low back pain.  9.  History of sepsis.  10. History of postoperative anemia.   CURRENT MEDICATIONS:  1.  Protonix 40 mg p.o. daily.  2.  Norvasc 5 mg p.o. daily.  3.  Glipizide 5 mg p.o. daily.  4.  Avandia 4 mg p.o. daily.  5.  Reglan 10 mg p.o. before meals or food and nightly.  6.  Lisinopril/hydrochlorothiazide 10/12.5 one tablet p.o. daily.  7.  Lyrica 7.5/650 one tablet p.o. q.8h. p.r.n.  8.  Lanoxin 0.5 mg p.o. 3 times a day.   SOCIAL HISTORY:  The patient is currently retired.  She is married.   No  history of alcohol, tobacco, or other substance abuse.   PHYSICAL EXAMINATION:  GENERAL:  The patient is alert and awake and  chronically sick looking.  VITAL SIGNS:  Blood pressure 120/80, pulse 88, respiratory rate 16,  temperature 98 degrees F.  HEENT: Pupils are equal and reactive.  NECK:  Supple.  CHEST:  Clear with good air entry without rhonchi.  CARDIOVASCULAR: Distant heart sounds heard.  No murmur, no gallop.  ABDOMEN: Soft and lax.  Bowel sounds are positive.  No organomegaly.  EXTREMITIES:  No leg edema.   ASSESSMENT:  1.  Pulmonary nodules, to rule out metastatic disease.  2.  Post obstructive pneumonia.  3.  Hilar and mediastinal lymphadenopathy.  4.  Diabetes mellitus.  5.  Hypertension.  6.  History of perforated duodenal ulcer.   PLAN:  1.  Will admit the patient and start her on IV antibiotics.  2.  Will do pulmonary consult for possible biopsy.  3.  Will do CT scan of abdomen and pelvis for possible primary lesion.  4.  Will do CEA.  5.  Will continue patient on her regular medications.  6.  Will continue to further evaluate to find the cause of her pulmonary      nodules.      Tesfaye D. [name], MD  Electronically Signed     TDF/MEDQ  D:  08/28/2005  T:  08/28/2005  Job:  [id number]

## 2011-01-12 NOTE — Consult Note (Signed)
NAMESERAPHINE, GUDIEL NO.:  [id number]   MEDICAL RECORD NO.:  [id number]          PATIENT TYPE:  INP   LOCATION:  IC09                          FACILITY:  [facility]   PHYSICIAN:  Edward L. Juanetta Gosling, M.D.DATE OF BIRTH:  24-Apr-1946   DATE OF CONSULTATION:  08/29/2005  DATE OF DISCHARGE:                                   CONSULTATION   [name] is a 65 year old patient of Dr. Letitia [name] who came to the emergency  room for chest pain.  The pain is in the mid chest.  It was not relieved by  rest.  It was somewhat better when she lied down.  She has work-up in the  emergency room including cardiac enzymes and EKG which were negative for  infarction.  Chest x-ray was done, shows right middle lobe atelectasis  versus infiltrate and because of that she had CT of chest which shows  mediastinal and bilateral hilar adenopathy, right paratracheal/pretracheal  space, precarinal/subcarinal space.  There are pulmonary nodules also seen  which are fairly small.  These are consistent with metastatic disease but  she does not have a primary carcinoma that is known.  She has never smoked,  does not have any other exposures that she knows of.  She says she has not  been losing weight. __________.  She has not had hemoptysis.   PAST MEDICAL HISTORY:  Positive for a severe illness earlier this year, in  which she had a perforated duodenal ulcer __________ laparotomy and acute  renal failure requiring dialysis, DIC.  She has diabetes, hypertension,  mitral valve prolapse, chronic low back pain, multiple surgeries __________  postop.  __________.   Reglan 10 mg __________ lisinopril/HCTZ 10/12.5 daily, Lyrica __________  hydrocodone/APAP 7.5/650 one q.8h., Lanoxin 0.5 mg 3 times daily.   SOCIAL HISTORY:  She is retired.  She is married __________.   FAMILY HISTORY:  __________.   PHYSICAL EXAMINATION:  GENERAL:  She is awake and alert __________.  VITAL SIGNS:  Blood pressure 116/68, pulse  68, respirations 18.  She is  afebrile.  HEENT:  Her pupils are round, reactive to light and accommodation.  Her  mucous membranes are moist.  NECK:  Supple without masses or jugular venous distention.  CHEST:  No wheezes, rales, rhonchi. Clear.  HEART:  Sounds are normal.  I do not hear a murmur or gallop.  ABDOMEN:  Soft __________ liver.  EXTREMITIES:  No leg edema.  LYMPH:  She did not have any evidence of what looked like any nodes in the  neck that would be easily hot biopsied.   ASSESSMENT:  She has an abnormal chest CT __________.  Based on position of  pulmonary nodules I am not certain __________.      Edward L. Juanetta Gosling, M.D.  Electronically Signed     ELH/MEDQ  D:  08/29/2005  T:  08/29/2005  Job:  [id number]

## 2011-01-12 NOTE — Consult Note (Signed)
[name], CLINCH NO.:  [id number]   MEDICAL RECORD NO.:  [id number]                   PATIENT TYPE:  INP   LOCATION:  IC09                                 FACILITY:  [facility]   PHYSICIAN:  Jorja Loa, M.D.             DATE OF BIRTH:  10/10/1945   DATE OF CONSULTATION:  DATE OF DISCHARGE:                                   CONSULTATION   REASON FOR CONSULTATION:  Renal insufficiency.   ATTENDING PHYSICIAN:  Dr. Ninetta Lights D. [name], M.D.   HISTORY OF PRESENT ILLNESS:  [name] is a 65 year old female admitted two  weeks ago for acute renal insufficiency, thought to be secondary to ATN  related to sepsis.  During that time, the patient had acute oliguric renal  failure.  She was started on hemodialysis.  Because of improvement in her  renal function, dialysis was discontinued, and she was discharged home  Friday, three days ago, to be followed as an outpatient.  Subsequently, the  patient came back with nausea, vomiting and diarrhea.  Hence, a consultation  was called. [name] at this moment denies any shortness of breath, no  orthopnea or paroxysmal nocturnal dyspnea.   PAST MEDICAL HISTORY:  She has a history of hypertension, history of  degenerative disease, history of diabetes, history of gastroesophageal  reflux disease, history of perforated duodenal ulcer, status post surgery.  History of DIC, history of acute renal failure requiring dialysis, and a  history of pancytopenia.   SOCIAL HISTORY:  She has had surgery previously for fusion of her vertebrae  x4, history of surgery for duodenal ulcers, history of tubal ligation,  history of cataract surgery.   SOCIAL HISTORY:  No smoking, no history of alcohol abuse.   MEDICATIONS:  At this moment:  1.  She is on Novolin insulin 12 units q.h.s.  2.  Reglan 10 mg q.6h.  3.  Protonix 40 mg p.o. q.i.d.  4.  __________ cc per hour.  5.  Potassium.  6.  Zofran.  7.  Phenergan.  8.  Zosyn 3.75  mg IV.  She received one dose.   ALLERGIES:  SULFA.   REVIEW OF SYSTEMS:  Overall, at this moment, she feels better.  She still  has some nausea but no vomiting today, not like she was yesterday.  She  denies any fevers, chills or sweating.  She has also a couple of watery  diarrheas without blood.  She denies shortness of breath or chest pain.   PHYSICAL EXAMINATION:  GENERAL:  The patient is alert and in no apparent  distress.  VITAL SIGNS:  Blood pressure 147/71.  HEENT:  No conjunctival pallor, nonicteric.  Oral mucosa seems to be dry.  NECK:  Supple, no JVD.  CHEST:  Decreased breath sounds, otherwise seems to be clear.  HEART:  Exam reveals regular rate and rhythm, no murmurs.  ABDOMEN:  Soft, positive  bowel sounds.  EXTREMITIES:  No edema.   LABORATORY DATA:  I&O's at this moment not documented.  Her blood work from  today.  Her white blood cell count is 11.3, hemoglobin 10.5, hematocrit  30.3.  Platelets 441,000.  Sodium is 138, potassium 3.2.  Yesterday, it was  2.9.  BUN 25, creatinine 5, CO2 of 23.   ASSESSMENT:  1.  Renal insufficiency, acute, nonoliguric.  She has been on dialysis until      five days ago.  The patient was discharged with creatinine of 3 on      September 16.  Presently it is 5.  Hence, probably her renal function      seems to be stabilizing.  At this moment, the patient does not have any      sign of fluid overload.  2.  Hypokalemia, probably related to previous diuretic use and also poor      p.o. intake.  She is on potassium supplementation.  Potassium seems to      be coming down.  3.  Diarrhea.  Need to rule out Clostridium difficile colitis as the patient      has been in the hospital, and has been on antibiotics.  4.  Nausea and vomiting.  Not sure whether she has gastroenteritis.  The      patient has recently had surgery for perforated gastric ulcer.  5.  History of diabetes.  6.  History of degenerative joint disease.    RECOMMENDATION:  1.  We will continue with hydration.  2.  We will check her input and output. If her BUN and creatinine continues      to come down, would remove her catheter.  I agree with potassium      supplement, and I agree also for check for possible Clostridium      difficile.  3.  We will follow the patient.      BB/MEDQ  D:  05/15/2004  T:  05/15/2004  Job:  [id number]

## 2011-01-12 NOTE — Procedures (Signed)
NAMEKANDISS, [name] NO.:  [id number]   MEDICAL RECORD NO.:  [id number]          PATIENT TYPE:  OUT   LOCATION:  RESP                          FACILITY:  [facility]   PHYSICIAN:  Edward L. Juanetta Gosling, M.D.DATE OF BIRTH:  [birth year]   DATE OF PROCEDURE:  DATE OF DISCHARGE:  05/26/2008                            PULMONARY FUNCTION TEST   PULMONARY FUNCTION TEST:  1. Spirometry shows no ventilatory defect and minimal airflow      obstruction at the level of the smaller airways.  2. Lung volumes are normal.  3. Diffusing capacity of lung for carbon monoxide was mildly reduced.  4. Blood gases are normal.   Compared to the test on November 25, 2007, there has been overall  improvement.      Edward L. Juanetta Gosling, M.D.  Electronically Signed     ELH/MEDQ  D:  05/29/2008  T:  05/29/2008  Job:  [id number]

## 2011-01-12 NOTE — Group Therapy Note (Signed)
NAMETRANISHA, TISSUE                             ACCOUNT NO.:  [id number]   MEDICAL RECORD NO.:  [id number]                   PATIENT TYPE:  INP   LOCATION:  A201                                 FACILITY:  [facility]   PHYSICIAN:  Edward L. Juanetta Gosling, M.D.             DATE OF BIRTH:  [birth year]   DATE OF PROCEDURE:  05/01/2004  DATE OF DISCHARGE:                                   PROGRESS NOTE   SUBJECTIVE:  [name] is seen in dialysis.  She seems to be doing a little  better.  She is still confused.  She has been moved from the intensive care  unit out to concentrated care in telemetry now.   PHYSICAL EXAMINATION:  GENERAL:  She is on dialysis.  VITAL SIGNS:  Temperature is 97.1, pulse 109, respirations 24, blood sugar  was 246, 183 earlier, blood pressure 143/73, O2 saturation 97% on 2 L,  weight 205.1.   Dr. Tad Moore has gone ahead and ordered removal of her Foley catheter, and has  suggested that she be started on Diflucan because she is at risk for a  systemic fungus disease.  I think that is reasonable and we will go ahead  and start that asking pharmacy to help with dosing.   LABORATORY DATA:  Her laboratory work this morning shows her white count is  13,900, which is better.  Hemoglobin is 9.1, which is about the same.  Hematocrit 26.5.  She still has 15% bands.  Prothrombin time on April 28, 2004, was 14.6.  Renal function today shows her creatinine is back up to  6.8, glucose 204, sodium 132, potassium 3.8, chloride 94.   ASSESSMENT:  She is still quite sick.  She is a little more anemic.  I am  going to go ahead and see if we can type and cross her, and if her  hemoglobin drops any further she will need some blood.  We will go ahead  with plans to start her on the Diflucan as mentioned.  Continue all the  other treatments and follow.      ___________________________________________                                            Oneal Deputy Juanetta Gosling, M.D.   Gwenlyn Found  D:   05/01/2004  T:  05/01/2004  Job:  [id number]

## 2011-01-12 NOTE — Op Note (Signed)
NAMESHERICKA, JOHNSTONE                 ACCOUNT NO.:  [id number]   MEDICAL RECORD NO.:  [id number]          PATIENT TYPE:  AMB   LOCATION:  DAY                           FACILITY:  [facility]   PHYSICIAN:  Lionel December, M.D.    DATE OF BIRTH:  25-Apr-1946   DATE OF PROCEDURE:  03/05/2005  DATE OF DISCHARGE:                                 OPERATIVE REPORT   PROCEDURE:  Colonoscopy.   INDICATIONS:  [name] is a 65 year old African American female with  constipation and hematochezia suspected to be secondary to hemorrhoids.  Hematochezia is suspected to be secondary to hemorrhoids but she is  undergoing colonoscopy to make sure she does not have neoplasm. Procedures  were reviewed the patient, informed consent was obtained.   PREMEDICATION:  Demerol 50 milligrams IV, Versed 10 milligrams IV in divided  dose.   FINDINGS:  Procedure performed in endoscopy suite. The patient's vital signs  and O2 sat were monitored during procedure and remained stable. The patient  was placed left lateral position and rectal examination performed. No  abnormality noted external or digital exam. Olympus videoscope was placed  rectum and advanced under vision into sigmoid colon beyond. Redundant colon.  Preparation was satisfactory. Scope was passed cecum which was identified by  appendiceal orifice, ileocecal valve. There was around it submucosal lesion  to the left of ileocecal valve. The mucosa was entirely normal.  Endoscopically this was suspicious for a leiomyoma.  It was left alone  although pictures taken for the record. As the scope was withdrawn, colonic  mucosa was carefully examined and was normal. Rectal mucosa similarly was  normal. Scope was retroflexed to examine anorectal junction and small  hemorrhoids were noted below the dentate line. Endoscope was straightened,  withdrawn. The patient tolerated the procedure well.   Incidental finding of a submucosal lesion at cecum. 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   External hemorrhoids felt to be source of hematochezia.   RECOMMENDATIONS:  She will continue high-fiber diet and Colace as before.  She will also start FiberChoice two tablets daily.   I would ask Dr. [name] to look at her old records to see if she has had H.  pylori serology. Otherwise,  this needs to be tested, given history of  perforated peptic ulcer disease in the past (possibly secondary to NSAID  therapy).   She will continue yearly Hemoccults and consider next screening exam in 10  years from now.       NR/MEDQ  D:  03/05/2005  T:  03/05/2005  Job:  [id number]   cc:   Tesfaye D. [name], MD  [address]  [place]  [place] [address]  Fax: [phone]

## 2011-01-12 NOTE — Group Therapy Note (Signed)
NAMEEMELLY, WURTZ                             ACCOUNT NO.:  [id number]   MEDICAL RECORD NO.:  [id number]                   PATIENT TYPE:  INP   LOCATION:  A201                                 FACILITY:  [facility]   PHYSICIAN:  Edward L. Juanetta Gosling, M.D.             DATE OF BIRTH:  May 28, 1946   DATE OF PROCEDURE:  05/06/2004  DATE OF DISCHARGE:                                   PROGRESS NOTE   PROBLEM:  Renal failure, status post bowel surgery, confusion improving.   SUBJECTIVE:  Ms. Demilio says that she is feeling better. She did dialysis  yesterday and seems to be doing better.   OBJECTIVE:  VITAL SIGNS:  Temperature 98.6, pulse 103, respiratory rate 20,  blood pressure 148. It was as high as 202 yesterday. Blood pressure 156/88.  O2 saturation is 96% with room air.  HEENT:  She still has an NG tube in but it is not hooked to suction.  CHEST:  Is pretty clear.  ABDOMEN:  Soft.  EXTREMITIES:  No edema.   ASSESSMENT:  She has renal failure status post dialysis. She has diabetes,  which is under pretty good control based on her Accu-check. She has  confusion which is better. She has had some nausea which she says is also  better.  Her lab today shows her sodium is 132, potassium 4.2, chloride 99,  CO2 30, glucose 131, BUN 26, creatinine 5.3.   ASSESSMENT:  I think overall she is better.   PLAN:  Continue with her treatments and medications. No changes today.      ___________________________________________                                            Oneal Deputy. Juanetta Gosling, M.D.   ELH/MEDQ  D:  05/06/2004  T:  05/06/2004  Job:  [id number]

## 2011-01-12 NOTE — Group Therapy Note (Signed)
NAMEJONELLE, BANN                             ACCOUNT NO.:  [id number]   MEDICAL RECORD NO.:  [id number]                   PATIENT TYPE:  INP   LOCATION:  A201                                 FACILITY:  [facility]   PHYSICIAN:  Edward L. Juanetta Gosling, M.D.             DATE OF BIRTH:  September 25, 1945   DATE OF PROCEDURE:  DATE OF DISCHARGE:                                   PROGRESS NOTE   SUBJECTIVE:  Ms. Mormino says she is feeling better and she is actually up  walking the hall today.   PHYSICAL EXAMINATION:  VITAL SIGNS:  Temperature 99, pulse 97, respirations  18, blood sugar 103, blood pressure 155/87, O2 saturation is 96% on room  air.  GENERAL:  She is not complaining of pain.  ABDOMEN:  Soft.  CHEST:  Very clear today.  HEART:  Regular.  She has made over one liter of urine now after receiving  some Lasix so her renal function seems to perhaps be recovering.   LABORATORY DATA:  White count 10,500, hemoglobin 8.8, hematocrit 25.9.  BMET  shows her creatinine is 6.4, BUN 32, sodium 133, potassium 3.9.  Creatinine  yesterday was 5.3.  However, she is at least starting to make urine.   ASSESSMENT:  My assessment is that she has renal failure, status post  abdominal surgery.  Hospital acquired confusion which is largely resolved  and she overall seems to be doing much better.   PLAN:  Continue treatments and follow.      ___________________________________________                                            Oneal Deputy Juanetta Gosling, M.D.   ELH/MEDQ  D:  05/07/2004  T:  05/07/2004  Job:  [id number]

## 2011-01-12 NOTE — Group Therapy Note (Signed)
NAMECAYLEE, [name] NO.:  [id number]   MEDICAL RECORD NO.:  [id number]                   PATIENT TYPE:  INP   LOCATION:  IC08                                 FACILITY:  [facility]   PHYSICIAN:  Edward L. Juanetta Gosling, M.D.             DATE OF BIRTH:  Jun 01, 1946   DATE OF PROCEDURE:  DATE OF DISCHARGE:                                   PROGRESS NOTE   PROBLEMS:  1.  Status post surgery for peptic ulcer disease.  2.  Renal failure.  3.  Sepsis.   SUBJECTIVE:  [name] says she is feeling okay, but she seems a little  confused.  She is on parenteral nutrition and her potassium has been  somewhat low, but is being replaced.  Her renal function was worse  yesterday.   PHYSICAL EXAMINATION:  VITAL SIGNS:  Pulse of 103, blood pressure 142/74, O2  saturations in the high 90s.  CHEST:  Actually pretty clear.  HEART:  Regular.  ABDOMEN:  Soft.   LABORATORY DATA:  White count 17,500, hemoglobin 19.8, hematocrit 28.3,  platelets 229.  Her metabolic profile shows her potassium is 2.9, this being  addressed per pharmacy per the TPN.  Creatinine is improved, now is 5.8, BUN  is 36.   ASSESSMENT:  Her renal function is better, this of course, is good news.  Her lipase is listed at 323.  Her chest is clear.  Her heart is regular.  Her abdomen is soft.   She seems to be doing better in general, certainly good improvement in her  renal function.   PLAN:  Continue treatments and follow.      ___________________________________________                                            Oneal Deputy Juanetta Gosling, M.D.   ELH/MEDQ  D:  04/30/2004  T:  04/30/2004  Job:  [id number]

## 2011-01-12 NOTE — H&P (Signed)
NAMEAINO, HECKERT                 ACCOUNT NO.:  [id number]   MEDICAL RECORD NO.:  [id number]          PATIENT TYPE:  AMB   LOCATION:                                FACILITY:  [facility]   PHYSICIAN:  Lionel December, M.D.    DATE OF BIRTH:  [birth year]   DATE OF ADMISSION:  DATE OF DISCHARGE:  LH                                HISTORY & PHYSICAL   CHIEF COMPLAINT:  Rectal bleeding and pain with bowel movements.   PHYSICIAN REQUESTING VISIT:  Tesfaye D. [name], M.D.   HISTORY OF PRESENT ILLNESS:  Fusae is a 65 year old lady who presents today  for further evaluation of hematochezia and rectal pain with defecation.  She  has intermittent constipation.  Within the last couple of weeks, she had an  episode of passing hard stool and noticed fresh blood per rectum.  It was  primarily on the toilet tissue.  She tried some Preparation H.  About three  days later, she had a bowel movement and noted fresh blood mixed in her  stool.  She has also had some rectal pain when passing her bowel movements.  She denies any melena, abdominal pain, nausea or vomiting.  Heartburn is  controlled on Protonix.  Rectal pain is described as throbbing.  On February 13, 2005, her hemoglobin was normal at 11.6 and MCV normal at 92.  LFTs and MET-  7 normal.  The hemoglobin A1c was 5.5.  On rectal examination by Dr. [name],  she had a normal anal sphincter, no masses and stool was heme positive.   CURRENT MEDICATIONS:  1.  Glipizide XL 5 mg daily.  2.  Lescol XL 80 mg daily.  3.  Avandia 4 mg daily.  4.  Phenergan 25 mg q.6h. p.r.n.  5.  Reglan 10 mg q.a.c. and q.h.s. p.r.n.  6.  Lisinopril/HCTZ daily.  7.  Norvasc 5 mg daily.  8.  Protonix 40 mg daily.  9.  Lorcet 10/650 mg one to two tablets q.8h. p.r.n.  10. Xanax 0.5 mg q.i.d. p.r.n.  11. Proctosol HC 2.5% b.i.d.  12. Colace 100 mg b.i.d.  13. Tylenol Arthritis two tablets three times a day.   ALLERGIES:  1.  SULFA.  2.  DARVOCET.  3.  ASPIRIN.   PAST  MEDICAL HISTORY:  1.  History of perforated duodenal ulcer with exploratory laparotomy which      is complicated by acute renal failure and postoperative sepsis requiring      prolonged hospitalization in 2005.  2.  Mitral valve prolapse.  3.  Hypertension.  4.  Type 2 diabetes mellitus.  5.  DIC postoperative, which resolved.  6.  She has had lumbar back fusion x 4 and sees Dr. Channing Mutters, her neurosurgeon,      every three months.  She is on Tylenol Arthritis given that she does not      tolerate NSAID or aspirin.  7.  Cataract extraction.  8.  Tubal ligation.   FAMILY HISTORY:  Her maternal grandmother had colon cancer.  Her paternal  aunt was diagnosed with  colon cancer at age 54 last year.   SOCIAL HISTORY:  She is married and has two sons.  She is retired.  Denies  any tobacco or alcohol use.   REVIEW OF SYSTEMS:  GASTROINTESTINAL:  See HPI.  CARDIOPULMONARY:  Denies  chest pain or shortness of breath.  Denies any weight loss.   PHYSICAL EXAMINATION:  WEIGHT:  Her weight is actually up from 182 pounds to  212 pounds since September of 2005.  HEIGHT:  5 feet 8 inches.  VITAL SIGNS:  Temperature 98.3 degrees, blood pressure 138/88, pulse 76.  GENERAL APPEARANCE:  A pleasant, well-nourished, well-developed, black  female in no acute distress.  SKIN:  Warm and dry.  No jaundice.  HEENT:  Pupils equal, round and reactive to light.  The conjunctivae are  pink.  Sclerae nonicteric.  Oropharyngeal mucosa moist and pink with no  lesions, erythema or exudate.  NECK:  No lymphadenopathy or thyromegaly.  CHEST:  Lungs are clear to auscultation.  CARDIAC:  Regular rate and rhythm.  Normal S1 and S2.  No murmurs, rubs or  gallops.  ABDOMEN:  Positive bowel sounds.  Soft, nontender and nondistended.  No  organomegaly or masses.  No rebound tenderness or guarding.  No abdominal  bruits or hernias.  EXTREMITIES:  No edema.   IMPRESSION:  Ms. Morning is a 65 year old lady who presents for  further  evaluation of rectal pain with defecation and hematochezia in the setting of  intermittent constipation.  Her family history is significant for colorectal  cancer in a maternal aunt and maternal grandmother.  She has never had a  colonoscopy, therefore one is highly recommended at this time for diagnostic  reasons.  She has a history of mitral valve and therefore will receive SBE  prophylaxis.   PLAN:  1.  Colonoscopy in the near future by Dr. Karilyn Cota.  I have discussed the      risks, benefits and alternatives with      regards to risks of perforation, incomplete exam, bleeding, infection      and reaction to medication.  The patient is agreeable to proceed.  2.  She will be given antibiotics for SBE prophylaxis.       LL/MEDQ  D:  02/21/2005  T:  02/21/2005  Job:  [id number]   cc:   Tesfaye D. [name], MD  [address]  [place]  [place] [address]  Fax: [phone]

## 2011-01-12 NOTE — Op Note (Signed)
[name], [name] NO.:  [id number]   MEDICAL RECORD NO.:  [id number]          PATIENT TYPE:  INP   LOCATION:  2023                         FACILITY:  [facility]   PHYSICIAN:  Salvatore Decent. Cornelius Moras, M.D. DATE OF BIRTH:  August 23, 1946   DATE OF PROCEDURE:  08/31/2005  DATE OF DISCHARGE:                                 OPERATIVE REPORT   PREOPERATIVE DIAGNOSIS:  Mediastinal lymphadenopathy.   POSTOPERATIVE DIAGNOSIS:  Mediastinal lymphadenopathy.   PROCEDURE:  1.  Flexible bronchoscopy with bronchoalveolar lavage.  2.  Mediastinoscopy.   SURGEON:  Salvatore Decent. Cornelius Moras, MD   ANESTHESIA:  General.   BRIEF CLINICAL NOTE:  The patient is a 65 year old obese female who presents  with atypical chest pain.  Chest CT scan demonstrates mediastinal and  bilateral hilar lymphadenopathy.  Bronchoscopy and mediastinoscopy are felt  indicated for definitive diagnostic purposes.  A full consultation has been  dictated previously.   OPERATIVE CONSENT:  The patient and her husband have been counseled at  length regarding the indications and potential benefits of bronchoscopy and  mediastinoscopy for definitive diagnosis of her mediastinal lymphadenopathy.  Alternative strategies have been discussed.  They understand and accept all  associated risks of surgery and desire to proceed as described.   OPERATIVE NOTE IN DETAIL:  The patient is brought to the operating room on  the above-mentioned date and placed in the supine position on the operating  table.  General endotracheal anesthesia is induced uneventfully under the  care and direction of Dr. Diamantina Monks.  Flexible bronchoscopy is performed  through the patient's existing endotracheal tube.  A 5-mm bronchoscope is  passed through the tube and the distal trachea, carina, and left and right  endobronchial tree are all visualized.  There is normal endobronchial  anatomy.  No endobronchial lesions are noted.  Both the left and right  mainstem bronchi are flushed with 10 mL of sterile saline solution and  lavage specimens are sent for routine cultures as well as AFB culture and  fungal culture.  The bronchoscope is removed uneventfully.   The patient is positioned with a soft roll behind her shoulders and the neck  gently extended.  The patient's anterior neck and chest are prepared and  draped in a sterile manner.  A small transverse low cervical incision is  made approximately 2 fingerbreadths above the sternal notch.  The incision  is completed through the platysma muscle with electrocautery.  The strap  muscles are divided in the midline.  The pretracheal fascia is incised.  The  pretracheal space  is developed bluntly.  A rigid mediastinoscope is passed  under direct vision into the mediastinum.  Routine exploration is performed.  There are large bulky lymph nodes easily appreciated in the mediastinum.  One of these large lymph nodes is easily removed from associated soft  tissues in the right lower paratracheal groove.  A portion of this specimen  is sent for frozen section histology.  Additional biopsy specimens are saved  and ultimately sent for routine histology as well as culture and  sensitivity, AFB smear  and culture, and fungal smear and culture.  Meticulous hemostasis is ascertained.  The mediastinoscope is removed.  The  platysma muscle is reapproximated with interrupted Vicryl sutures and the  skin is closed with a subcuticular skin closure.   The patient tolerated the procedure well, is extubated in the operating  room, and transported to the recovery room in stable condition.  There are  no intraoperative complications.  Preliminary frozen section pathology  results are found to be consistent with noncaseating granulomatous  inflammation suggestive of the diagnosis of sarcoidosis.      Salvatore Decent. Cornelius Moras, M.D.  Electronically Signed     CHO/MEDQ  D:  08/31/2005  T:  09/01/2005  Job:  [id number]    cc:   Ramon Dredge L. Juanetta Gosling, M.D.  Fax: [phone]   Tesfaye D. [name], MD  Fax: [phone]

## 2011-01-12 NOTE — Procedures (Signed)
NAMETACHA, MANNI NO.:  [id number]   MEDICAL RECORD NO.:  [id number]          PATIENT TYPE:  OUT   LOCATION:  RESP                          FACILITY:  [facility]   PHYSICIAN:  Edward L. Juanetta Gosling, M.D.DATE OF BIRTH:  07/25/1946   DATE OF PROCEDURE:  DATE OF DISCHARGE:                            PULMONARY FUNCTION TEST   1. Spirometry shows a mild ventilatory defect with evidence of airflow      obstruction.  2. Lung volumes show total lung capacity is normal.  3. DLCO is moderately reduced.  4. Arterial blood gases are normal.  5. There is no significant bronchodilator improvement.  Compared to      the study of December 13, 2005, there has been some improvement in the      airflow obstruction.  Total lung capacity is decreased slightly.      DLCO has decreased slightly.      Edward L. Juanetta Gosling, M.D.  Electronically Signed     ELH/MEDQ  D:  11/26/2007  T:  11/26/2007  Job:  [id number]

## 2011-01-12 NOTE — Discharge Summary (Signed)
NAMEKALEEYA, HANCOCK NO.:  [id number]   MEDICAL RECORD NO.:  [id number]          PATIENT TYPE:  INP   LOCATION:  2023                         FACILITY:  [facility]   PHYSICIAN:  Tesfaye D. [name], MD   DATE OF BIRTH:  January 26, 1946   DATE OF ADMISSION:  08/28/2005  DATE OF DISCHARGE:  01/04/2007LH                                 DISCHARGE SUMMARY   DISCHARGE DIAGNOSES:  1.  Chest wall pain.  2.  Pulmonary nodules.  3.  Hilar lymphadenopathy.  4.  Diabetes mellitus.  5.  Hypertension.  6.  History of duodenal ulcer.   DISPOSITION:  The patient was transferred to [facility] for  mediastinoscopy and biopsy of pulmonary nodule.   HOSPITAL COURSE:  This is a 65 year old, black female with history of  multiple medical illnesses who came to the emergency room with complaint of  chest pain.  The patient's chest x-ray showed multiple pulmonary nodules and  mediastinal and hilar lymphadenopathy.  The patient was admitted and started  on empiric IV antibiotics for possible obstructive pneumonia.  She, however,  had no sign of fever or chills.  Her chest pain was treated as possible  chest wall pain.  Pulmonary consult was done and later transfer was arranged  to [facility] for mediastinoscopy and biopsy.  The patient was  transferred in stable condition.      Tesfaye D. [name], MD  Electronically Signed     TDF/MEDQ  D:  09/24/2005  T:  09/24/2005  Job:  [id number]

## 2011-01-12 NOTE — H&P (Signed)
[name], [name]                             ACCOUNT NO.:  [id number]   MEDICAL RECORD NO.:  [id number]                   PATIENT TYPE:  INP   LOCATION:  IC09                                 FACILITY:  [facility]   PHYSICIAN:  Tesfaye D. [name], M.D.              DATE OF BIRTH:  1946-03-08   DATE OF ADMISSION:  05/14/2004  DATE OF DISCHARGE:                                HISTORY & PHYSICAL   CHIEF COMPLAINT:  Nausea, vomiting, diarrhea of two days duration.   HISTORY OF PRESENT ILLNESS:  This is a 65 year old female patient who has a  history of multiple medical illnesses who presented to the emergency room  with above complaints.  The patient was discharged from this facility about  three days back after she was treated for perforated duodenal ulcer.  The  patient had postoperative sepsis, DIC and acute renal failure.  She had a  prolonged hospital stay on TPN.  The patient gradually improved and she was  discharged home on Friday.  The patient started having nausea, vomiting and  diarrhea on Saturday.  Since then she was unable to keep anything in her  stomach.  She was brought to the emergency room last night and was found to  have low potassium.  The patient was supplemented with potassium and was  started on intravenous fluids and intravenous antibiotics and the patient  was admitted for further treatment.   REVIEW OF SYMPTOMS:  The patient had no fever, chills, abdominal pain,  dysuria, urgency, hematemesis, melena.  The patient complains of  intermittent headache and generalized weakness.  She has also back pain.  No  cough, shortness of breath, palpitations or orthopnea.   PAST MEDICAL HISTORY:  1.  Diabetes mellitus type 2.  2.  History of recent perforated duodenal ulcer and status post exploratory      laparotomy.  3.  Acute renal failure requiring hemodialysis.  4.  DIC which has resolved.  5.  Sepsis.  6.  Postoperative anemia.  7.  Hypertension.   SOCIAL HISTORY:   The patient is married.  She is currently retired.  No  history of alcohol, tobacco or substance abuse.   CURRENT MEDICATIONS:  1.  Demodex 60 mg p.o. daily.  2.  Protonix 40 mg p.o. daily.  3.  Lortab 5/500 mg one tablet q.6h.  4.  Kay-Ciel 40 mEq p.o. daily.  5.  Levaquin 500 mg p.o. daily.  6.  Clindamycin 300 mg p.o. t.i.d.  7.  Norvasc 5 mg p.o. daily.  8.  Avandia 4 mg p.o. daily.  9.  Glucotrol XL 2.5 mg p.o. daily.   PHYSICAL EXAMINATION:  GENERAL:  Patient is alert, awake and chronically  sick-looking.  VITAL SIGNS:  Blood pressure 163/97, pulse 90, respiratory rate 20,  temperature 97.48F.  HEENT:  Pupils equal, round, reactive.  NECK:  Supple.  CHEST:  Decreased air entry.  CARDIOVASCULAR:  First and second heart sounds heard, no murmur, no gallop.  ABDOMEN:  Soft and lax.  Bowel sounds positive.  No mass or organomegaly.  There is a small wound around her incision area.  EXTREMITIES:  No leg edema.   LABORATORY DATA:  White blood cell count 14.4, hemoglobin 10.9, hematocrit  32.1, platelet count 489,000.  Sodium 135, potassium 2.9, chloride 100, cO2  20, glucose 118, BUN 38, creatinine 5.4, total bilirubin 1.7, alkaline  phosphatase 62, SGOT 18.  Cardiac enzymes were negative.   ASSESSMENT:  This is a 65 year old female patient who was recently  discharged from this facility after she was treated for perforated duodenal  ulcer.  She comes in with nausea, vomiting, diarrhea.  The patient had a  complicated postoperative period with acute renal failure, sepsis and  disseminated intravascular coagulation.  The patient has been on intravenous  antibiotics.  Her current symptoms could be due to gastroenteritis as well  as Clostridium difficile colitis.  Acute renal failure is gradually  improving.  The patient has good urine output.   PLAN:  Will keep the patient on clear liquid diet.  Will continue  intravenous fluids, continue to monitor her electrolytes and will also  do  stool work up and will do surgical and nephrology consultations.      TDF/MEDQ  D:  05/15/2004  T:  05/15/2004  Job:  [id number]

## 2011-01-12 NOTE — Group Therapy Note (Signed)
[name], [name]                 ACCOUNT NO.:  [id number]   MEDICAL RECORD NO.:  [id number]          PATIENT TYPE:  INP   LOCATION:  A332                          FACILITY:  [facility]   PHYSICIAN:  Edward L. Juanetta Gosling, M.D.DATE OF BIRTH:  18-Oct-1945   DATE OF PROCEDURE:  08/30/2005  DATE OF DISCHARGE:                                   PROGRESS NOTE   SUBJECTIVE:  [name] says she is still having chest discomfort that is  requiring her to take morphine. She is otherwise about the same. She has no  other new complaints.   OBJECTIVE:  Temperature is 98.6, pulse 81, respirations 18, blood sugar 152,  blood pressure 125/68.   I discussed her situation with Dr. Cornelius Moras of the CVTS group yesterday. Our  original plan was to try to get her to his office, but she says she is not  going to be able to be without the intravenous morphine, so I going to see  if we can get her transferred directly to [facility]. Will discuss  with whoever is on-call for [facility] today.      Edward L. Juanetta Gosling, M.D.  Electronically Signed     ELH/MEDQ  D:  08/30/2005  T:  08/30/2005  Job:  [id number]

## 2011-01-12 NOTE — Consult Note (Signed)
[name], [name] NO.:  [id number]   MEDICAL RECORD NO.:  [id number]                   PATIENT TYPE:  INP   LOCATION:  IC08                                 FACILITY:  [facility]   PHYSICIAN:  Ladona Horns. Mariel Sleet, MD               DATE OF BIRTH:  [birth year]   DATE OF CONSULTATION:  DATE OF DISCHARGE:                                   CONSULTATION   CONSULTING PHYSICIAN:  Ladona Horns. Mariel Sleet, M.D.   DIAGNOSES:  1.  DIC.  2.  Renal failure.  3.  Status post surgical exploration for an acute ruptured duodenal ulcer      with repair.  4.  Probable sepsis.   HISTORY:  This is a pleasant but confused 65 year old woman who was  admitted, on April 20, 2004, was found to have a perforated viscus with  pneumoperitoneum and was admitted, operated on by Dr. Katrinka Blazing, and was found  to have a perforated viscus which was repaired.  Since that time, her  hemoglobin has dropped, and she has been transfused with at least four units  of packed cells but more importantly her platelets have dropped somewhat  dramatically.  She was found to have an abnormal PT PTT.  D-dimmer test  greater than 20.  Fibrinogen was still normal at 498, in fact, that is  slightly elevated based upon the normal range.  She was found to be  hypotensive, transferred to the ICU over the weekend and given fluids, blood  products, and was started on antibiotics after the surgery.  Her renal  function has continued to deteriorate as well.  Her most recent BUN was 62,  creatinine 6.3 on today's date.  Previously it had been 5.1 creatinine, BUN  of 45 on the 29th.  Her white count has remained fairly well within the  normal range, though she does have a slight left shift to her differential.  Her hemoglobin, today, is once again low at 8.0 grams, after it had gotten  up to 10.3 with transfusional therapy as of yesterday.  Her white count is  9600, platelets are down to 56,000, and they were well within  the normal  range at the time of her admission.  I was asked to see her because of the  question of DIC.   SOCIAL HISTORY:  I could not obtain from her.  She states that she has three  children.  I could not verify that that is true.  She states that she is  married, and she does have a wedding band on.   She did not remember the ages of her children.  She did not remember the  day, or the day of the week, or the month, or the year.  She was not sure  who her doctor was.  She was tearful spontaneously.  She told me that she  did not feel  well whatsoever.  She was complaining of some stomach  discomfort but was not specific as to location.  She has been febrile most  recently in the last 24-36 hours.  Her temperature today was over 101  degrees; on April 22, 2004, it was 99.2 degrees.   PHYSICAL EXAMINATION:  VITAL SIGNS:  Show a woman who is febrile.  SKIN:  Hot and dry to touch.  She had no real petechiae, no ecchymoses, no  bleeding from the mucous membranes, no conjunctival petechiae, no palatal  petechiae.  She was not bleeding from her IV sites.  LUNGS:  Clear anteriorly.  ABDOMEN:  She was tender over the right upper quadrant just with mild  palpation.  She had essentially no bowel sounds.  No obvious masses, but she  had some distention of her abdomen, and it was covered with a large bandage  which I did not remove.  HEART:  Showed a rate right around 100-110.  There was no distinct S3,  gallop, or murmur.  BREAST:  Deferred.  EXTREMITIES:  She has no finger edema, but she has puffiness of her arms and  of her legs, and she has 1+ edema at least of her lower extremities.   This lady not only has fever, mental status changes, renal failure,  microangiopathic hemolysis it appears on a peripheral smear with numerous  schistocytes, severe thrombocytopenia, and an elevated PT PTT and D-dimmer  test, but she may well be infected, though a recent blood culture was  negative.  She  is on antibiotics, fairly broad spectrum and yet I am  concerned she may still have ongoing either sepsis or abscess formation.   I think she needs a CAT scan of her abdomen and pelvis to make sure there is  not an abscess that may need to be drained.  She needs ongoing antibiotic  therapy, and she needs to be supported with blood products as needed.  She  is not bleeding at this time, so I think platelets can be withheld.  FFP can  also be withheld, I think, at this time.  I will tentatively give her a dose  of vitamin K in the hopes that this prolonged PT and PTT may be from vitamin  deficiency, but I doubt that that is the case.  She certainly needs  dialysis, and if she is not better she may well need plasmapheresis because  this could also be HUS potentially.   We will follow her.  I will talk to Dr. [name] with whom I have already  spoken and Dr. Katrinka Blazing.      ___________________________________________                                            Ladona Horns. Mariel Sleet, MD   ESN/MEDQ  D:  04/25/2004  T:  04/25/2004  Job:  [id number]

## 2011-02-23 ENCOUNTER — Emergency Department (HOSPITAL_COMMUNITY): Payer: 59

## 2011-02-23 ENCOUNTER — Inpatient Hospital Stay (HOSPITAL_COMMUNITY)
Admission: EM | Admit: 2011-02-23 | Discharge: 2011-03-01 | DRG: 493 | Disposition: A | Discharge status: Skilled Nursing Facility | Payer: 59 | Attending: Orthopedic Surgery | Admitting: Orthopedic Surgery

## 2011-02-23 DIAGNOSIS — S82209A Unspecified fracture of shaft of unspecified tibia, initial encounter for closed fracture: Principal | ICD-10-CM | POA: Diagnosis present

## 2011-02-23 DIAGNOSIS — M549 Dorsalgia, unspecified: Secondary | ICD-10-CM | POA: Diagnosis present

## 2011-02-23 DIAGNOSIS — E211 Secondary hyperparathyroidism, not elsewhere classified: Secondary | ICD-10-CM | POA: Diagnosis present

## 2011-02-23 DIAGNOSIS — E119 Type 2 diabetes mellitus without complications: Secondary | ICD-10-CM | POA: Diagnosis present

## 2011-02-23 DIAGNOSIS — W010XXA Fall on same level from slipping, tripping and stumbling without subsequent striking against object, initial encounter: Secondary | ICD-10-CM | POA: Diagnosis present

## 2011-02-23 DIAGNOSIS — R55 Syncope and collapse: Secondary | ICD-10-CM | POA: Diagnosis present

## 2011-02-23 DIAGNOSIS — R9431 Abnormal electrocardiogram [ECG] [EKG]: Secondary | ICD-10-CM | POA: Diagnosis present

## 2011-02-23 DIAGNOSIS — E876 Hypokalemia: Secondary | ICD-10-CM | POA: Diagnosis present

## 2011-02-23 DIAGNOSIS — I10 Essential (primary) hypertension: Secondary | ICD-10-CM | POA: Diagnosis present

## 2011-02-23 DIAGNOSIS — G8929 Other chronic pain: Secondary | ICD-10-CM | POA: Diagnosis present

## 2011-02-23 DIAGNOSIS — J99 Respiratory disorders in diseases classified elsewhere: Secondary | ICD-10-CM | POA: Diagnosis present

## 2011-02-23 DIAGNOSIS — D62 Acute posthemorrhagic anemia: Secondary | ICD-10-CM | POA: Diagnosis not present

## 2011-02-23 DIAGNOSIS — D869 Sarcoidosis, unspecified: Secondary | ICD-10-CM | POA: Diagnosis present

## 2011-02-23 DIAGNOSIS — M810 Age-related osteoporosis without current pathological fracture: Secondary | ICD-10-CM | POA: Diagnosis present

## 2011-02-23 DIAGNOSIS — Y92009 Unspecified place in unspecified non-institutional (private) residence as the place of occurrence of the external cause: Secondary | ICD-10-CM

## 2011-02-23 DIAGNOSIS — Z882 Allergy status to sulfonamides status: Secondary | ICD-10-CM

## 2011-02-23 LAB — BASIC METABOLIC PANEL
BUN: 24 mg/dL — ABNORMAL HIGH (ref 6–23)
CO2: 29 mEq/L (ref 19–32)
Calcium: 8.8 mg/dL (ref 8.4–10.5)
Chloride: 101 mEq/L (ref 96–112)
Creatinine, Ser: 1.02 mg/dL (ref 0.50–1.10)
GFR calc Af Amer: >60 mL/min (ref >60)
GFR calc non Af Amer: 55 mL/min — ABNORMAL LOW (ref >60)
Glucose, Bld: 126 mg/dL — ABNORMAL HIGH (ref 70–99)
Potassium: 2.5 mEq/L — CL (ref 3.5–5.1)
Sodium: 140 mEq/L (ref 135–145)

## 2011-02-23 LAB — URINALYSIS, ROUTINE W REFLEX MICROSCOPIC
Bilirubin Urine: NEGATIVE
Glucose, UA: NEGATIVE mg/dL
Hgb urine dipstick: NEGATIVE
Ketones, ur: 15 mg/dL — AB
Leukocytes, UA: NEGATIVE
Nitrite: NEGATIVE
Protein, ur: NEGATIVE mg/dL
Specific Gravity, Urine: 1.019 (ref 1.005–1.030)
Urobilinogen, UA: 1 mg/dL (ref 0.0–1.0)
pH: 6.5 (ref 5.0–8.0)

## 2011-02-23 LAB — CBC
HCT: 34.2 % — ABNORMAL LOW (ref 36.0–46.0)
Hemoglobin: 11.3 g/dL — ABNORMAL LOW (ref 12.0–15.0)
MCH: 29.4 pg (ref 26.0–34.0)
MCHC: 33 g/dL (ref 30.0–36.0)
MCV: 88.8 fL (ref 78.0–100.0)
Platelets: 199 10*3/uL (ref 150–400)
RBC: 3.85 MIL/uL — ABNORMAL LOW (ref 3.87–5.11)
RDW: 14.9 % (ref 11.5–15.5)
WBC: 7.3 10*3/uL (ref 4.0–10.5)

## 2011-02-23 LAB — DIFFERENTIAL
Basophils Absolute: 0 10*3/uL (ref 0.0–0.1)
Basophils Relative: 0 % (ref 0–1)
Eosinophils Absolute: 0.1 10*3/uL (ref 0.0–0.7)
Eosinophils Relative: 1 % (ref 0–5)
Lymphocytes Relative: 24 % (ref 12–46)
Lymphs Abs: 1.8 10*3/uL (ref 0.7–4.0)
Monocytes Absolute: 0.6 10*3/uL (ref 0.1–1.0)
Monocytes Relative: 8 % (ref 3–12)
Neutro Abs: 4.9 10*3/uL (ref 1.7–7.7)
Neutrophils Relative %: 66 % (ref 43–77)

## 2011-02-23 LAB — GLUCOSE, CAPILLARY: Glucose-Capillary: 129 mg/dL — ABNORMAL HIGH (ref 70–99)

## 2011-02-23 LAB — PROTIME-INR
INR: 1.01 (ref 0.00–1.49)
Prothrombin Time: 13.5 seconds (ref 11.6–15.2)

## 2011-02-23 LAB — TYPE AND SCREEN
ABO/RH(D): A NEG
Antibody Screen: NEGATIVE

## 2011-02-23 LAB — APTT: aPTT: 22 seconds — ABNORMAL LOW (ref 24–37)

## 2011-02-24 ENCOUNTER — Inpatient Hospital Stay (HOSPITAL_COMMUNITY): Payer: 59

## 2011-02-24 LAB — COMPREHENSIVE METABOLIC PANEL
ALT: 13 U/L (ref 0–35)
AST: 18 U/L (ref 0–37)
Albumin: 3.3 g/dL — ABNORMAL LOW (ref 3.5–5.2)
Alkaline Phosphatase: 31 U/L — ABNORMAL LOW (ref 39–117)
BUN: 16 mg/dL (ref 6–23)
CO2: 28 mEq/L (ref 19–32)
Calcium: 8.2 mg/dL — ABNORMAL LOW (ref 8.4–10.5)
Chloride: 105 mEq/L (ref 96–112)
Creatinine, Ser: 0.68 mg/dL (ref 0.50–1.10)
GFR calc Af Amer: >60 mL/min (ref >60)
GFR calc non Af Amer: >60 mL/min (ref >60)
Glucose, Bld: 125 mg/dL — ABNORMAL HIGH (ref 70–99)
Potassium: 2.6 mEq/L — CL (ref 3.5–5.1)
Sodium: 140 mEq/L (ref 135–145)
Total Bilirubin: 0.4 mg/dL (ref 0.3–1.2)
Total Protein: 6 g/dL (ref 6.0–8.3)

## 2011-02-24 LAB — BASIC METABOLIC PANEL
BUN: 11 mg/dL (ref 6–23)
CO2: 25 mEq/L (ref 19–32)
Calcium: 7.5 mg/dL — ABNORMAL LOW (ref 8.4–10.5)
Chloride: 105 mEq/L (ref 96–112)
Creatinine, Ser: 0.6 mg/dL (ref 0.50–1.10)
GFR calc Af Amer: >60 mL/min (ref >60)
GFR calc non Af Amer: >60 mL/min (ref >60)
Glucose, Bld: 146 mg/dL — ABNORMAL HIGH (ref 70–99)
Potassium: 3.1 mEq/L — ABNORMAL LOW (ref 3.5–5.1)
Sodium: 138 mEq/L (ref 135–145)

## 2011-02-24 LAB — PHOSPHORUS: Phosphorus: 1.6 mg/dL — ABNORMAL LOW (ref 2.3–4.6)

## 2011-02-24 LAB — SURGICAL PCR SCREEN
MRSA, PCR: NEGATIVE
Staphylococcus aureus: NEGATIVE

## 2011-02-24 LAB — GLUCOSE, CAPILLARY
Glucose-Capillary: 124 mg/dL — ABNORMAL HIGH (ref 70–99)
Glucose-Capillary: 147 mg/dL — ABNORMAL HIGH (ref 70–99)
Glucose-Capillary: 140 mg/dL — ABNORMAL HIGH (ref 70–99)
Glucose-Capillary: 155 mg/dL — ABNORMAL HIGH (ref 70–99)

## 2011-02-24 LAB — TSH: TSH: 1.223 u[IU]/mL (ref 0.350–4.500)

## 2011-02-24 LAB — C-PEPTIDE: C-Peptide: 1.57 ng/mL (ref 0.80–3.90)

## 2011-02-24 LAB — MAGNESIUM: Magnesium: 2.8 mg/dL — ABNORMAL HIGH (ref 1.5–2.5)

## 2011-02-24 LAB — CALCIUM, IONIZED: Calcium, Ion: 1.13 mmol/L (ref 1.12–1.32)

## 2011-02-25 LAB — CBC
HCT: 26.5 % — ABNORMAL LOW (ref 36.0–46.0)
Hemoglobin: 8.7 g/dL — ABNORMAL LOW (ref 12.0–15.0)
MCH: 29.6 pg (ref 26.0–34.0)
MCHC: 32.8 g/dL (ref 30.0–36.0)
MCV: 90.1 fL (ref 78.0–100.0)
Platelets: 149 10*3/uL — ABNORMAL LOW (ref 150–400)
RBC: 2.94 MIL/uL — ABNORMAL LOW (ref 3.87–5.11)
RDW: 15.6 % — ABNORMAL HIGH (ref 11.5–15.5)
WBC: 6.1 10*3/uL (ref 4.0–10.5)

## 2011-02-25 LAB — GLUCOSE, CAPILLARY
Glucose-Capillary: 152 mg/dL — ABNORMAL HIGH (ref 70–99)
Glucose-Capillary: 142 mg/dL — ABNORMAL HIGH (ref 70–99)
Glucose-Capillary: 83 mg/dL (ref 70–99)
Glucose-Capillary: 96 mg/dL (ref 70–99)

## 2011-02-25 LAB — BASIC METABOLIC PANEL
BUN: 6 mg/dL (ref 6–23)
CO2: 24 mEq/L (ref 19–32)
Calcium: 7.5 mg/dL — ABNORMAL LOW (ref 8.4–10.5)
Chloride: 108 mEq/L (ref 96–112)
Creatinine, Ser: 0.56 mg/dL (ref 0.50–1.10)
GFR calc Af Amer: >60 mL/min (ref >60)
GFR calc non Af Amer: >60 mL/min (ref >60)
Glucose, Bld: 137 mg/dL — ABNORMAL HIGH (ref 70–99)
Potassium: 3.6 mEq/L (ref 3.5–5.1)
Sodium: 139 mEq/L (ref 135–145)

## 2011-02-25 LAB — URINE CULTURE
Colony Count: NO GROWTH
Culture  Setup Time: 201206300516
Culture: NO GROWTH

## 2011-02-25 LAB — HEMOGLOBIN A1C
Hgb A1c MFr Bld: 5.8 % — ABNORMAL HIGH (ref <5.7)
Mean Plasma Glucose: 120 mg/dL — ABNORMAL HIGH (ref <117)

## 2011-02-26 DIAGNOSIS — S82109A Unspecified fracture of upper end of unspecified tibia, initial encounter for closed fracture: Secondary | ICD-10-CM

## 2011-02-26 LAB — GLUCOSE, CAPILLARY
Glucose-Capillary: 99 mg/dL (ref 70–99)
Glucose-Capillary: 109 mg/dL — ABNORMAL HIGH (ref 70–99)
Glucose-Capillary: 89 mg/dL (ref 70–99)
Glucose-Capillary: 112 mg/dL — ABNORMAL HIGH (ref 70–99)

## 2011-02-26 LAB — DIFFERENTIAL
Basophils Absolute: 0 10*3/uL (ref 0.0–0.1)
Basophils Relative: 1 % (ref 0–1)
Eosinophils Absolute: 0.2 10*3/uL (ref 0.0–0.7)
Eosinophils Relative: 3 % (ref 0–5)
Lymphocytes Relative: 24 % (ref 12–46)
Lymphs Abs: 1.4 10*3/uL (ref 0.7–4.0)
Monocytes Absolute: 0.6 10*3/uL (ref 0.1–1.0)
Monocytes Relative: 11 % (ref 3–12)
Neutro Abs: 3.7 10*3/uL (ref 1.7–7.7)
Neutrophils Relative %: 62 % (ref 43–77)

## 2011-02-26 LAB — COMPREHENSIVE METABOLIC PANEL
ALT: 19 U/L (ref 0–35)
AST: 27 U/L (ref 0–37)
Albumin: 2.4 g/dL — ABNORMAL LOW (ref 3.5–5.2)
Alkaline Phosphatase: 58 U/L (ref 39–117)
BUN: 8 mg/dL (ref 6–23)
CO2: 21 mEq/L (ref 19–32)
Calcium: 8.4 mg/dL (ref 8.4–10.5)
Chloride: 108 mEq/L (ref 96–112)
Creatinine, Ser: 0.58 mg/dL (ref 0.50–1.10)
GFR calc Af Amer: >60 mL/min (ref >60)
GFR calc non Af Amer: >60 mL/min (ref >60)
Glucose, Bld: 89 mg/dL (ref 70–99)
Potassium: 4.3 mEq/L (ref 3.5–5.1)
Sodium: 138 mEq/L (ref 135–145)
Total Bilirubin: 0.3 mg/dL (ref 0.3–1.2)
Total Protein: 5.2 g/dL — ABNORMAL LOW (ref 6.0–8.3)

## 2011-02-26 LAB — CBC
HCT: 25.2 % — ABNORMAL LOW (ref 36.0–46.0)
Hemoglobin: 8.3 g/dL — ABNORMAL LOW (ref 12.0–15.0)
MCH: 29.7 pg (ref 26.0–34.0)
MCHC: 32.9 g/dL (ref 30.0–36.0)
MCV: 90.3 fL (ref 78.0–100.0)
Platelets: 161 10*3/uL (ref 150–400)
RBC: 2.79 MIL/uL — ABNORMAL LOW (ref 3.87–5.11)
RDW: 15.9 % — ABNORMAL HIGH (ref 11.5–15.5)
WBC: 6 10*3/uL (ref 4.0–10.5)

## 2011-02-26 LAB — VITAMIN D 25 HYDROXY (VIT D DEFICIENCY, FRACTURES): Vit D, 25-Hydroxy: 10 ng/mL — ABNORMAL LOW (ref 30–89)

## 2011-02-27 LAB — GLUCOSE, CAPILLARY
Glucose-Capillary: 116 mg/dL — ABNORMAL HIGH (ref 70–99)
Glucose-Capillary: 142 mg/dL — ABNORMAL HIGH (ref 70–99)
Glucose-Capillary: 97 mg/dL (ref 70–99)
Glucose-Capillary: 113 mg/dL — ABNORMAL HIGH (ref 70–99)

## 2011-02-27 LAB — BASIC METABOLIC PANEL
BUN: 9 mg/dL (ref 6–23)
CO2: 25 mEq/L (ref 19–32)
Calcium: 9.1 mg/dL (ref 8.4–10.5)
Chloride: 104 mEq/L (ref 96–112)
Creatinine, Ser: 0.61 mg/dL (ref 0.50–1.10)
GFR calc Af Amer: >60 mL/min (ref >60)
GFR calc non Af Amer: >60 mL/min (ref >60)
Glucose, Bld: 119 mg/dL — ABNORMAL HIGH (ref 70–99)
Potassium: 3.9 mEq/L (ref 3.5–5.1)
Sodium: 137 mEq/L (ref 135–145)

## 2011-02-27 LAB — PTH, INTACT AND CALCIUM
Calcium, Total (PTH): 8.3 mg/dL — ABNORMAL LOW (ref 8.4–10.5)
PTH: 200.8 pg/mL — ABNORMAL HIGH (ref 14.0–72.0)

## 2011-02-27 LAB — PREALBUMIN: Prealbumin: 20.5 mg/dL (ref 17.0–34.0)

## 2011-02-28 LAB — GLUCOSE, CAPILLARY
Glucose-Capillary: 110 mg/dL — ABNORMAL HIGH (ref 70–99)
Glucose-Capillary: 117 mg/dL — ABNORMAL HIGH (ref 70–99)
Glucose-Capillary: 91 mg/dL (ref 70–99)
Glucose-Capillary: 110 mg/dL — ABNORMAL HIGH (ref 70–99)

## 2011-02-28 LAB — CBC
HCT: 25 % — ABNORMAL LOW (ref 36.0–46.0)
Hemoglobin: 8.3 g/dL — ABNORMAL LOW (ref 12.0–15.0)
MCH: 29.4 pg (ref 26.0–34.0)
MCHC: 33.2 g/dL (ref 30.0–36.0)
MCV: 88.7 fL (ref 78.0–100.0)
Platelets: 241 10*3/uL (ref 150–400)
RBC: 2.82 MIL/uL — ABNORMAL LOW (ref 3.87–5.11)
RDW: 15.5 % (ref 11.5–15.5)
WBC: 5.1 10*3/uL (ref 4.0–10.5)

## 2011-02-28 LAB — BASIC METABOLIC PANEL
BUN: 10 mg/dL (ref 6–23)
CO2: 26 mEq/L (ref 19–32)
Calcium: 9.4 mg/dL (ref 8.4–10.5)
Chloride: 103 mEq/L (ref 96–112)
Creatinine, Ser: 0.62 mg/dL (ref 0.50–1.10)
GFR calc Af Amer: >60 mL/min (ref >60)
GFR calc non Af Amer: >60 mL/min (ref >60)
Glucose, Bld: 97 mg/dL (ref 70–99)
Potassium: 3.7 mEq/L (ref 3.5–5.1)
Sodium: 138 mEq/L (ref 135–145)

## 2011-02-28 LAB — VITAMIN D 1,25 DIHYDROXY
Vitamin D 1, 25 (OH)2 Total: 79 pg/mL — ABNORMAL HIGH (ref 18–72)
Vitamin D2 1, 25 (OH)2: <8 pg/mL
Vitamin D3 1, 25 (OH)2: 79 pg/mL

## 2011-02-28 LAB — PHOSPHORUS: Phosphorus: 2.8 mg/dL (ref 2.3–4.6)

## 2011-02-28 NOTE — Consult Note (Signed)
[name], [name] NO.:  [id number]  MEDICAL RECORD NO.:  [id number]  LOCATION:  [facility]                         FACILITY:  [facility]  PHYSICIAN:  Zannie Cove, MD     DATE OF BIRTH:  25-Mar-1946  DATE OF CONSULTATION: DATE OF DISCHARGE:                                CONSULTATION   PRIMARY PHYSICIAN:  Tiffany D. [name], MD  PULMONOLOGIST:  Tiffany Deputy. Tiffany Gosling, MD  REASON FOR CONSULT:  Presyncope and cardiac clearance.  HISTORY OF PRESENTING ILLNESS:  [name] is a 65 year old African American female, who was in her usual state of health and laying in bed and suddenly stood up and subsequently felt dizzy.  She tugged on her leg and injured her left leg on something and subsequently sustained left tibial fracture.  She is being admitted by orthopedics for surgery tomorrow and triad hospitalist were consulted for medical management and cardiac clearance.  Of note, the patient denies any chest pain, shortness of breath, or dizziness at this time.  She thinks that it was related to getting up and standing up too soon from a lying down position.  Denies any previous syncopal event.  Denies any known history of cardiac problems.  Reports history of cardiac cath over 5 years ago, which at the time was okay per the patient report, also has history of pulmonary sarcoidosis followed by [name].  PAST MEDICAL HISTORY:  Significant for; 1. Diabetes. 2. Hypertension. 3. Pulmonary sarcoidosis. 4. History of duodenal ulcer.  SOCIAL HISTORY:  Lives at home with her husband.  Denies any history of tobacco or alcohol use.  FAMILY HISTORY:  No history of premature CAD.  MEDICATIONS:  Yet to be confirmed including doses include; 1. Lisinopril. 2. Hydrochlorothiazide. 3. Glipizide. 4. Dexalone. 5. Simvastatin. 6. Hydroxychloroquine. 7. Fentanyl. 8. VESIcare. 9. Lexapro. 10.Norvasc. 11.Alprazolam. 12.Reglan. 13.Fosamax. 14.Skelaxin.  ALLERGIES: 1.  DARVOCET. 2. SULFA.  PHYSICAL EXAMINATION:  VITAL SIGNS:  Temp is 98.3, pulse 82, blood pressure is 97/65, respirations 16, and satting 99% on room air. GENERAL:  This is a well-built African American female, in no distress, lying in the stretcher. HEENT:  Pupils round and reactive to light.  Extraocular movements intact. NECK:  No JVD or lymphadenopathy. CARDIOVASCULAR:  S1 and S2.  Regular rate and rhythm. LUNGS:  Clear to auscultation bilaterally. ABDOMEN:  Soft and nontender with positive bowel sounds.  No organomegaly. EXTREMITIES:  The left tibia with a stressing is immobilized.  No distal edema and pulses are intact.  LABORATORY DATA:  Review of diagnostics include white count of 7.3, hemoglobin 11.3, and platelets 199.  Chemistries, sodium 140, potassium 2.5, chloride 101, bicarb 29, BUN 24, creatinine 1.0, and glucose 126. Chest x-ray shows enlargement of severe aspect of the right pulmonary hilum was her previous exam potentially adenopathy was her projectional artifact due to differences between AP and PA technique.  EKG, she has a QTc of 634 msec.  No other acute ST-T wave changes.  ASSESSMENT AND PLAN:  [name] is a 65 year old female with left tibial fracture. 1. Presyncope. 2. Prolonged QTc. 3. Severe hypokalemia. 4. Pulmonary sarcoidosis. 5. Diabetes. 6. Hypertension.  PLAN:  I  suspect a prolonged QT is due to severe hypokalemia, which is likely from her diuretic.  In addition, she also did not take her usual potassium supplement today.  We will place additional potassium.  She has already received 20 mEq in the ED.  I will give her another 40 IV and 60 p.o.  We will repeat bumetanide and in a.m.  In addition, we will admit her to telemetry and repeat EKG in the morning after her potassium has been replaced.  The patient has also received IV magnesium to decrease the risk of torsades and agree with this.  We will also check her mag level in the morning.  I  have requested a cardiology evaluation for this in order to clear her prior to surgery.  We will follow up on her repeat electrolytes and QTc and hopefully we will not need to delay surgery.  Further management as condition evolves.     Zannie Cove, MD     PJ/MEDQ  D:  02/23/2011  T:  02/23/2011  Job:  [id number]  Electronically Signed by Zannie Cove  on 02/28/2011 04:42:28 PM

## 2011-03-01 ENCOUNTER — Inpatient Hospital Stay
Admission: RE | Admit: 2011-03-01 | Discharge: 2011-04-09 | Disposition: A | Discharge status: Home / Self Care | Payer: 59 | Source: Ambulatory Visit | Attending: Pulmonary Disease | Admitting: Pulmonary Disease

## 2011-03-01 LAB — GLUCOSE, CAPILLARY
Glucose-Capillary: 117 mg/dL — ABNORMAL HIGH (ref 70–99)
Glucose-Capillary: 144 mg/dL — ABNORMAL HIGH (ref 70–99)

## 2011-03-02 LAB — MISCELLANEOUS TEST

## 2011-03-02 LAB — GLUCOSE, CAPILLARY: Glucose-Capillary: 95 mg/dL (ref 70–99)

## 2011-03-03 LAB — GLUCOSE, CAPILLARY: Glucose-Capillary: 100 mg/dL — ABNORMAL HIGH (ref 70–99)

## 2011-03-04 LAB — GLUCOSE, CAPILLARY: Glucose-Capillary: 89 mg/dL (ref 70–99)

## 2011-03-05 LAB — GLUCOSE, CAPILLARY
Glucose-Capillary: 97 mg/dL (ref 70–99)
Glucose-Capillary: 104 mg/dL — ABNORMAL HIGH (ref 70–99)

## 2011-03-06 LAB — GLUCOSE, CAPILLARY: Glucose-Capillary: 96 mg/dL (ref 70–99)

## 2011-03-07 LAB — GLUCOSE, CAPILLARY: Glucose-Capillary: 107 mg/dL — ABNORMAL HIGH (ref 70–99)

## 2011-03-08 LAB — GLUCOSE, CAPILLARY: Glucose-Capillary: 97 mg/dL (ref 70–99)

## 2011-03-09 LAB — GLUCOSE, CAPILLARY: Glucose-Capillary: 103 mg/dL — ABNORMAL HIGH (ref 70–99)

## 2011-03-09 LAB — MISCELLANEOUS TEST

## 2011-03-10 LAB — GLUCOSE, CAPILLARY: Glucose-Capillary: 111 mg/dL — ABNORMAL HIGH (ref 70–99)

## 2011-03-10 NOTE — H&P (Signed)
[name], [name] NO.:  [id number]  MEDICAL RECORD NO.:  [id number]  LOCATION:  S111                          FACILITY:  [facility]  PHYSICIAN:  Ajai Harville L. Juanetta [name], M.D.DATE OF BIRTH:  [birth year]  DATE OF ADMISSION:  03/01/2011 DATE OF DISCHARGE:  LH                             HISTORY & PHYSICAL   Patient at the [facility].  Reason for admission to the [facility] is spiral fracture of left distal tibia.  She also has other medical problems including history of previous perforated duodenal ulcer, lumbar spine surgery x4, open reduction internal fixation of left bimalleolar fracture x2, osteoporosis, sarcoidosis, hypertension and diabetes.  [name] is a 65 year old who fell at home.  She said that she had gone to a family reunion and forgot to take her potassium tablets.  She became weak and fell.  She was evaluated and found to have a tibial shaft fracture which is a spiral fracture.  She has a past medical history that is positive for sarcoid, osteoporosis, hypertension, chronic low back pain, diabetes, duodenal ulcers and a previous ankle fracture.  Her social history, she lives at home with her husband.  She does not use tobacco or alcohol or illicit drugs.  Her family history is positive for COPD.  Her review of systems except as mentioned is negative.  PHYSICAL EXAMINATION:  Her vital signs are as recorded.  Her chest is clear.  Her heart is regular.  Her abdomen is soft.  Her extremities showed that she got the surgical scar.  Central nervous system examination is grossly intact.  ASSESSMENT:  She has a fracture of the distal leg.  She has multiple other medical problems.  She has had a severe problem with getting pain control, but that is better and she is working with physical therapy.  Plan will be for her to be discharged back home when she is ready.     [name], M.D.     ELH/MEDQ  D:  03/10/2011  T:  03/10/2011  Job:   [id number]

## 2011-03-11 LAB — GLUCOSE, CAPILLARY: Glucose-Capillary: 87 mg/dL (ref 70–99)

## 2011-03-12 LAB — GLUCOSE, CAPILLARY: Glucose-Capillary: 95 mg/dL (ref 70–99)

## 2011-03-13 LAB — GLUCOSE, CAPILLARY: Glucose-Capillary: 99 mg/dL (ref 70–99)

## 2011-03-14 LAB — GLUCOSE, CAPILLARY: Glucose-Capillary: 87 mg/dL (ref 70–99)

## 2011-03-15 LAB — GLUCOSE, CAPILLARY: Glucose-Capillary: 93 mg/dL (ref 70–99)

## 2011-03-15 NOTE — Discharge Summary (Signed)
[name], [name] NO.:  [id number]  MEDICAL RECORD NO.:  [id number]  LOCATION:  5001                         FACILITY:  [facility]  PHYSICIAN:  Doralee Albino. Carola Frost, M.D. DATE OF BIRTH:  1946/04/14  DATE OF ADMISSION:  02/23/2011 DATE OF DISCHARGE:                        DISCHARGE SUMMARY - REFERRING   DISCHARGE DIAGNOSES:  Are as follows: 1. Left distal third tibial shaft fracture. 2. Presyncopal episode with prolonged QTC interval and severe     hypokalemia since resolved stopping hydrochlorothiazide as likely     contributing factor in addition of multifactorial secondary to heat     waves in this region.  The 3. Hypokalemia, improved as per above.  Cardiology recommendation for     acute plus greater than or equal to 4.0. 4. Metabolic bone disease.  No additional secondary causes at the     current time suspected for metabolic bone disease, probably primary     osteoporosis.  Vitamin D panel is still pending. 5. Hypocalcemia.  This is spurious as corrected calcium and ionized     calcium were normal.  Continue to monitor.  Also, include a     metabolic bone disease likely contributed by prolonged     bisphosphonate use. 6. Acute blood loss anemia, stable.  ADDITIONAL DISCHARGE DIAGNOSES: 1. Hypertension. 2. Chronic back pain. 3. Sarcoidosis. 4. Type 2 diabetes. 5. History of duodenal ulcers. 6. History of mitral valve prolapse.  PROCEDURES PERFORMED:  On February 24, 2011, iron nailing left tibia, statically locked a few nail 11 x 360.  BRIEF HISTORY AND HOSPITAL COURSE:  [name] is a very pleasant 65 year old African American female who presented to [facility] on February 23, 2011, after sustaining a fall at home.  Upon my arrival to the ED, the patient's question and in my best assessment it sounds as if the patient had a syncopal episode as such we obtained a inpatient medicine consult stat to workup and evaluate that.  EKG in the ED  demonstrated a prolonged QTC interval with a QTC of 634 milliseconds.  Also later on when her labs returned back, it was found that she did have a severe hypokalemia with level of 2.5.  This was verified several times.  The patient was given potassium IV runs to help restore or replete her potassium levels.  She was admitted to the Telemetry Unit given these EKG findings as well as severe hypokalemia to continue to monitor her heart rhythm.  We did not proceed to surgery the day of admission secondary to these cardiac changes.  We did await for a repletion. However, the following morning her potassium only rose to 2.6 even after several runs of 10 mEq of potassium as well as approximately 80 mEq potassium orally divided evenly.  As such, we still decided to proceed with surgery as the patient would continue to be monitored in the OR and more potassium runs would be given.  The patient was brought to the OR on the day noted above.  She tolerated the procedure very well.  After the surgery, she was brought to the PACU for recovery from anesthesia and then was transported back to  the telemetry floor for continued monitoring.  On postoperative day #1, the patient was doing fairly well. She did have some pain, but she was afebrile.  Vital signs were stable. Exam was essentially unremarkable.  Her potassium did rise to 3.6 as well.  We did start her on Lovenox for DVT prophylaxis.  On postoperative day #1, we also did begin to obtain some of her metabolic bone disease panel labs.  These were essentially unremarkable.  Serum C2 peptide was in the normal range of 1.7.  TSH normal at 1.223, ionized calcium normal at 1.13, phosphorus was slightly low at 1.6, magnesium is low elevated at 2.8.  She does have a low albumin and albumin of 3.3 as well.  Alk phos was low as well at 31.  Again at the time of discharge, bone specific alkaline phosphatase, vitamin D panel and intact PTH were still pending  as well.  The patient began physical therapy on postoperative day #1 still requiring a fairly significant assist. Postop day #2, the patient was continued to improve.  Hemoglobin was 8.3, hematocrit 25.2.  Vital signs were stable.  No significant findings were noted.  She continued to progress well.  Again, her hydrochlorothiazide was stopped as it was believed that this was a part of the cause of her presyncopal episode and severe hypokalemia.  The patient was also started on calcium and vitamin D as well as stopping her Fosamax which had used for greater than 5 years likely contributing to her altered bone morphology and density.  We did obtain a inpatient rehabilitation consult.  On postoperative day #2, the patient was deemed a candidate for inpatient rehab as well.  Postop day #3, the patient doing fantastic.  She was transported to the orthopedic floor as internal medicine discontinued her tele as she was stable.  Pain was doing better with more frequent Norco dosing.  Of note, the patient's normal bowel regimen is one time a week.  She was not complaining of any abdominal pain or bloating.  A clinical encounter note for postop day #3 is as follows.  SUBJECTIVE OBJECTIVE:  The patient is doing much better. VITAL SIGNS:  Temperature 98.4, heart rate 89, respirations 18 at 98% on room air, BP is 140/88.  Sodium 137, potassium 3.9, chloride 104, bicarb 25, BUN 9, creatinine 0.61, glucose 119.  Again, complete metabolic bone panel labs are pending. GENERAL:  No acute distress and comfortable. LUNGS:  Clear bilaterally. CARDIAC:  S1 and S2 are noted. ABDOMEN:  Soft.  Positive bowel sounds.  Left lower extremity incisions look fantastic.  Swelling is controlled.  Areas of ecchymosis are noted. EXTREMITIES:  Warm.  Palpable dorsalis pedis pulses noted.  Deep peroneal nerve, superficial peroneal nerve, tibial nerve, femoral nerve and sensory function are intact.  EHL, FHL, anterior  tibialis, posterior tibialis, peroneals and gastroc-soleus complex motor function are intact.  No pain with passive stretch.  ASSESSMENT AND PLAN:  A 65 year old female, status post fall secondary to presyncopal episode with left tibial shaft fracture postop day 3, stable. 1. Left tibial shaft fracture, status post intramedullary nail,     nonweightbearing 6-8 weeks.  Continue with range of motion as     tolerated of her knee and ankle.  Good active and active-assisted     and passive range of motion.  The patient can perform quad set,     straight leg raises, femoral knee extensions, short-arm quads, long-     arm quads and Thera-Band activities  for ankle as well as heel cord     stretching and the like.  Ice and elevate as needed.  Dressing     changes as needed as well.  We would continue with the night splint     when at rest to maintain a neutral position as well. 2. Presyncopal episode, stable.  No hydrochlorothiazide at discharge     secondary to severe hypokalemia with EKG changes. 3. Hypokalemia, improved.  Card recommends K plus greater than or     equal to 4.0. 4. Hypertension.  The patient will need medication review with her     primary care physician upon discharge to evaluate the need for all     of her medications. 5. Deep venous thrombosis prophylaxis.  Lovenox x14 days.  The patient     is on day 3 of 14. 6. Metabolic bone disease.  We will stop Fosamax as she has been on it     for greater than 5 years without bone holiday.  Recent literature     again suggest treatment.  Should not last one within 2 years     without a drug holiday.  Again, half life of Fosamax and     bisphosphonate is approximately 10 years.  However, her labs do     look fairly stable from what I can see.  However, the low alk phos     may indicate decrease osteoblastic activity.  We will wait     confirmation with bone specific alkaline phosphatase to confirm     this.  Again, we await vitamin  D panel.  The patient has been     started on low-dose calcium and vitamin D supplementation.  I do     not want to use full-dose calcium supplements in this patient     secondary to her cardiac issues and potential conduction     abnormalities with hypercalcemia.  The patient will need a DEXA     scan as an outpatient to further quantify her bone density as well.     Acute blood loss anemia, stable, asymptomatic. 7. Other medical issues as stable. 8. Disposition.  Stable for discharge to CIR today.  DISCHARGE MEDICATIONS: 1. Norco 10/325 one to two p.o. q.4 h. as needed for pain. 2. Robaxin 500 mg 1 p.o. q.6 h. p.r.n. 3. Oxycodone 5 mg 1-2 p.o. q.3 h. as needed for pain. 4. Simvastatin 40 mg 1 p.o. daily. 5. Potassium chloride 20 mg p.o. b.i.d.  Recheck BMET in 1 week. 6. Plaquenil 200 mg p.o. daily. 7. Glipizide 5 mg daily with meal or before meals. 8. Lexapro 20 mg p.o. daily. 9. Colace 100 mg 1 p.o. b.i.d. 10.Enablex 15 mg at bedtime. 11.Vitamin D3 200 international units p.o. daily. 12.Calcium citrate 600 mg p.o. daily. 13.Resume Norvasc 5 mg p.o. daily. 14.Resume Dexilant 60 mg p.o. daily. 15.The patient will hold and resume VESIcare 10 mg p.o. daily at     bedtime.  The patient will hold the following medications from her home medication rec form.  She will hold her combination of lisinopril/hydrochlorothiazide and her primary care physician will review the need for multiple hypertensive medications.  Hold Lorcet as the patient is on other pain medication, hold fentanyl patch, hold alprazolam possibly additional medication causing her presyncopal episode and hold Skelaxin as well.  DISCHARGE INSTRUCTIONS AND PLANS:  Ms. Sperl sustained a fairly significant injury to her left tibia secondary to syncopal episode.  Her syncopal episode was  likely multifactorial from medications, heat and dehydration.  She did have severe hypokalemia on admission with EKG changes.  These  have since been corrected.  The patient will be nonweightbearing on her left lower extremity for the next 6-8 weeks. Presumptively, the patient has fairly significant osteoporosis and decreased bone density.  Given her age, her history of medications including chronic PPI use as well as Fosamax or bisphosphonate use.  Her bone was very soft which was appreciated intraoperatively.  Again, we are awaiting final labs, but her osteoporosis is likely primary which is further exacerbated by her chronic medication use and her chronic diseases such as sarcoid as well as diabetes.  The patient does not demonstrate evidence of hyperthyroidism which can contribute to the decreased bone density.  Again, I am waiting for her PTH to come back to see if there is any component of hyperparathyroidism present as well. The patient will continue on vitamin D and low dose calcium supplementation as well.  Again based on her current labs, I am little concerned that her osteoblastic activity is not at its peak and if this is the case which would be confirmed by bone specific alkaline phosphatase and T1MP levels.  The patient would most likely benefit from an anabolic agent such as forte for the increase of bone mineral density.  Again, the patient should have a DEXA scan as an outpatient as well when she is recovered from this injury.  The patient should continue with physical therapy on a regular basis including active and passive range of motion of her knee and ankle.  Quad sets, femoral knee extensions, long-arm quads, straight leg raises, ankle Thera-Band and heel cord stretching are encouraged as well.  Ice, elevation, TED Hose for swelling control as well.  The patient will remain on Lovenox for another 11 days after discharge from the hospital.  She is encouraged to be up and mobile as much as possible.  Her wounds are stable at the time of discharge.  She can begin cleaning these with soap and water  taking the shower.  No ointment lotions or solutions should be applied to the wounds and they can be covered as needed.  The patient should follow up with her primary care physician, who is Dr. Kari Baars in [place] within 2 weeks of discharge from the hospital to review her medications. Again, I would also recommend follow up BMET in 2-3 days to evaluate her potassium level again and to ensure that her supplementation is appropriate and either or not too little nor too much.  Should the patient have any questions at the time of her discharge, she can contact [phone].  She should follow up with Orthopedics in 10-14 days for followup x-rays and removal of sutures.  If the patient is still in inpatient rehab at that time, we will see her and complete all of that necessary followup care.     Mearl Latin, PA   ______________________________ Doralee Albino. Carola Frost, M.D.    KWP/MEDQ  D:  02/27/2011  T:  02/27/2011  Job:  [id number]  Electronically Signed by Montez Morita PA on 03/06/2011 07:49:12 AM Electronically Signed by Myrene Galas M.D. on 03/15/2011 06:52:02 PM

## 2011-03-15 NOTE — Discharge Summary (Signed)
NAMECORALEE, [name] NO.:  [id number]  MEDICAL RECORD NO.:  [id number]  LOCATION:  5001                         FACILITY:  [facility]  PHYSICIAN:  Doralee Albino. Carola Frost, M.D. DATE OF BIRTH:  09/22/1945  DATE OF ADMISSION:  02/23/2011 DATE OF DISCHARGE:                        DISCHARGE SUMMARY - REFERRING   ADDENDUM:  DISCHARGE DIAGNOSES:  Please see previous discharge summary, but they are as follows: 1. Left distal third tibial shaft fracture. 2. Presyncopal episode with prolonged QTC interval and severe     hypokalemia since resolved stopping hydrochlorothiazide and likely     contributing factor in addition to multifactorial causes including     dehydration and increased environmental temperature. 3. Hypokalemia, improved as above.  Cardiology recommended potassium     level greater than or equal to 4.0. 4. Hypocalcemia with normal corrected calcium and ionized calcium     levels, although at the lower end of normal. 5. Acute blood loss anemia, stable. 6. Metabolic bone disease/secondary hyperparathyroidism.  This is     amended from her previous discharge summary as intact parathyroid     hormone did come back on postoperative day 3.  ADDITIONAL DISCHARGE DIAGNOSES:  Hypertension, chronic back pain, sarcoidosis, type 2 diabetes, history of duodenal ulcers and history of mitral valve prolapse.  Please see previous discharge summary for complete hospital course.  On postop day #3, we did await for bed for SNF.  However, one was not available.  The patient remained in the hospital for another 2 days, remained completely stable without any acute issues.  No interval history.  Of note, the patient's intact PTH level did return and it was significantly elevated with a low level of 200.8 with normal to low normal calcium, which is indicative of secondary hyperparathyroidism. The patient is without any evidence of chronic kidney disease and, therefore, she likely  does not have any renal osteodystrophy or frank renal causes to her hyperparathyroidism.  She does have diabetes and may have some microvascular disease of her kidneys which potentially could be contributing to her hyperparathyroidism.  However, I suspect in view of her thiazide diuretic use, the patient probably does have low calcium levels which can trigger activation of parathyroid hormone which may explain her elevated PTH levels.  I do suspect that her hyperparathyroidism is due to poor absorption of calcium and impaired vitamin D symphysis due to a decreased dietary intake.  However, the patient does not respond to conservative treatment of calcium and vitamin D supplementation.  We may need to explore other causes suggestive of impaired absorption such as malabsorption syndrome, celiac disease except her.  She is still awaiting for a vitamin D panel to return to evaluate for low vitamin D levels, but again I do expect these to be low.  In addition, the patient did have a slightly elevated serum phosphorus level on admission which can also contribute to activation of parathyroid hormone as it is closely related to calcium homeostasis.  We will recheck these prior to departure to ensure that these have corrective themselves.  The patient currently remains on calcium citrate 500 mg p.o. daily as well as a vitamin D3 2000 international units  daily.  I will hold increasing these pending return of her vitamin D level.  In view of the fact that we are stopping her hydrochlorothiazide, the patient may demonstrate hypocalcemia as thiazide diuretics result in an increased absorption of calcium and decreased excretion.  Therefore, we may need to increase her calcium supplementation.  However, we will follow this closely with serial labs. I suspect that once we get the patient on a steady regimen of calcium and vitamin D, her PTH levels will normalize.  We will hold restarting any  pharmacologic bone health medications until her levels are corrected.  In addition, I am still awaiting return of bone metabolism markers including bone specific alkaline phosphatase, P1 and P as well as NP1 and P.  These will demonstrate if the patient is in a metabolic bone state with respect to her bone health.  Please see previous discharge summary for full summary and clinical course as well as any additional recommendations.     Mearl Latin, PA   ______________________________ Doralee Albino. Carola Frost, M.D.    KWP/MEDQ  D:  02/28/2011  T:  02/28/2011  Job:  [id number]  Electronically Signed by Montez Morita PA on 03/06/2011 07:49:20 AM Electronically Signed by Myrene Galas M.D. on 03/15/2011 06:52:05 PM

## 2011-03-15 NOTE — H&P (Signed)
NAMEJESSCIA, IMM NO.:  [id number]  MEDICAL RECORD NO.:  [id number]  LOCATION:  3735                         FACILITY:  [facility]  PHYSICIAN:  Doralee Albino. Carola Frost, M.D. DATE OF BIRTH:  July 19, 1946  DATE OF ADMISSION:  02/23/2011 DATE OF DISCHARGE:                             HISTORY & PHYSICAL   CHIEF COMPLAINT:  Fall with left leg pain.  PRIMARY CARE PHYSICIAN:  Edward L. Juanetta Gosling, MD in [place], [place].  BRIEF HISTORY OF PRESENT ILLNESS:  Ms. Placide is a very pleasant 65 year old African American female who was reportedly at home today when she sustained a fall resulting in injury to her left leg.  The patient reports dizzy spell after getting up attempting to get something in her house, got very lightheaded and subsequently fell down resulting in injury to her left leg.  The patient had immediate onset of pain and inability to bear weight.  EMS was called, and the patient was brought to [facility] for evaluation.  She arrived approximately at 14:30 by ambulance to the emergency department.  Workup was done, and it was found that the patient had a left distal third tibial shaft fracture.  Upon further inquiry, the patient denied any antecedent chest pain or shortness of breath, but again she did have dizziness.  She does report a similar episode like this back in 2008, which also resulted in a bimalleolar ankle fracture that was fixed by Dr. Norlene Campbell. Currently, Ms. Sawchuk is in PT-011.  She appears to be very comfortable. Her leg is not in a splint.  Her left leg is on a pillow.  She complains of pain in her left leg, but again appears very comfortable.  Denies any current chest pain.  No shortness of breath.  No nausea, vomiting, or diarrhea.  No headaches.  No fevers.  No chills.  No current lightheadedness or dizziness.  No visual changes.  The patient denies any additional injuries elsewhere.  PAST MEDICAL HISTORY: 1. Notable for  sarcoidosis.  Most recent pulmonary function tests were     in 2005, which were stable. 2. Osteoporosis. 3. Hypertension. 4. Chronic back pain. 5. Diabetes type 2. 6. History of duodenal ulcers. 7. Questionable history of mitral valve prolapse.  PAST SURGICAL HISTORY:  Notable for ex-lap with Cheree Ditto closure of perforated duodenal ulcer, history of four lumbar spine surgeries, colonoscopy, flexible bronchoscopy with bronchoalveolar lavage, and mediastinoscopy, ORIF left bimalleolar ankle fracture x2.  FAMILY HISTORY:  Noncontributory.  SOCIAL HISTORY:  The patient lives at home with her husband in [place].  While they live in house, she does not use any ambulator, does not use tobacco or alcohol products.  ALLERGIES:  SULF and DARVOCET,  which cause swelling.  HOME MEDICATIONS:  Skelaxin, Fosamax that the patient has been on several years, she thinks about 5 years, Reglan, sodium bicarbonate, alprazolam, Norvasc, Lexapro, Vesicare, Phenergan, fentanyl, hydroxychloroquine, Lorcet, simvastatin, Dexilant, lisinopril/hydrochlorothiazide, and glipizide.  REVIEW OF SYSTEMS:  Notable for no chest pain, no shortness of breath, no current dizziness.  PHYSICAL EXAMINATION:  VITAL SIGNS:  Temperature 98.3, heart rate 82, respirations 16 at 99% on room air, BP is  97/65 which was conducted when lying and on the left arm. GENERAL:  The patient is well-appearing 65 year old African American female in no acute distress, very pleasant, answered all questions appropriately. HEENT:  Head is atraumatic.  Extraocular muscles are intact.  Membranes slightly dry.  She is missing some teeth. NECK:  Supple.  No CAD.  No bruits appreciated. CHEST:  Clear bilaterally. CARDIAC:  S1 and S2 are noted.  It is regular.  No murmur, rubs, or gallops. ABDOMEN:  Soft, nontender.  Positive bowel sounds. PELVIS:  No instability. EXTREMITIES:  Bilateral upper extremities and right lower extremity without  any acute findings.  Motor and sensory functions are intact. Active motion is noted all joints.  Palpable pulses are appreciated. Left lower extremity, hip and knee without acute findings.  No tenderness to the hip or knee.  No tenderness on palpation to the thigh. The patient does have deformity and tenderness to palpation of the left distal tibia.  There is crepitus with movement of the left distal tibia as well.  Pain with palpation on the distal tibia.  There are no open wounds or lesions.  Skin is intact.  The integument is intact.  Deep peroneal nerves, superficial peroneal nerves, and tibial nerves sensations are intact.  EHL, FHL, anterior tibialis posterior tibialis, peroneals, gastroc-soleus motor  complexes are intact as well. Extremities are warm.  No deep calf tenderness.  Palpable dorsalis pedis pulse noted.  Compartments are soft and nontender.  No pain with passive stretch is noted.  X-rays, plain view of the left distal tibia demonstrates a spiral-type fracture of the distal third of the left tibial shaft.  There is retained ankle hardware present and is stable.  Current labs available demonstrate INR of 1.01.  PT of 13.5, PTT of 22. Hemoglobin 11.3, hematocrit 34.2, platelets 199, white blood cells 7.3. BMET is pending.  Brief evaluation of the EKG shows prolonged QTc interval as her dominant finding.  ASSESSMENT:  A 65 year old African American female status post fall with left tibial shaft fracture, fall secondary to a syncopal episode. 1. Left distal third tibial shaft fracture.  We will proceed to the OR     tomorrow for intramedullary nailing of this fracture if the patient     cleared by medicine.  The patient will be nonweightbearing for 6-8     weeks.  She will not have any range of motion restrictions.  We     will evaluate for possibility of inpatient rehab versus discharge     home with home health PT depending on her progress after surgery.     Continue  with ice and elevation for the time being.  We will have     the patient placed in a posterior and stirrup splint for comfort     and to stabilize fracture until surgery. 2. Syncopal episode.  Given the fact that this is at least the second     time that this has occurred with fracture, I think it would be     prudent to obtain a medicine consult to evaluate for possible     cardiac etiology in lieu of EKG changes.  Again, chemistry     depending, which may also add additional light to current     situation.  It is possible that patient's syncope is a combination     of orthostatic and vasovagal type since syncope secondary to rapid     raising from a lying position coupled with polypharmacy as  well as     the significant heat wave that we have been experiencing in     [place] and surrounding neighborhood for the past day and a half     or so, in addition to some mild dehydration and the patient appears     to be limiting as well.  Again, we will obtain a medicine consult     to fully evaluate the potential cause of syncope .  We also check     orthostatics as well to provide additional information. 3. Polypharmacy likely contributing to syncopal episode.  We will need     to review all her medications closely at the time of discharge.     Again, the patient's blood pressure was running fairly low upon     arrival at 97/65, and this could suggest overmedication. 4. We will put the patient on Lovenox after surgery for short-term     deep vein thrombosis/pulmonary embolus prophylaxis. 5. Fluids, electrolytes, nutrition.  Awaiting BMET, we will gently     hydrate the patient to go through the hydration.  We will use     normal saline with 20K plus 100 mL/hour.  The patient will be a     diabetic-modified diet for the time being and would be n.p.o. after     midnight. 6. Osteoporosis.  We will hold the patient's Fosamax as well as and     appears that she has been on it for quite some  time without a drug     holiday.  I discussed the pharmacokinetics in a basic sense with     the patient and her son in terms of disorganized bone formation as     it related to prolonged use of Fosamax.  I suspect that she does     have some disorganized bone formation occurring, and her bone is     likely week to some extent; therefore, we will complete a metabolic     bone panel workup to begin evaluation of for bone density and     overall bone health.  The patient reports that she has never had a     DEXA scan.  This is something that should be explored upon     discharge as an outpatient to quantify her bone density.  We may     also consider starting anabolic agent, such as Forteo as an     outpatient as well.  DISPOSITION: 1. The patient will be admitted to the telemetry floor for continued     observation as well as cardiac monitoring in lieu of EKG findings     as well mechanism of fall.  We will need to monitor her very     closely. 2. Internal medicine consult to facilitate management and syncope     evaluation.  The patient is cleared for surgery tomorrow.  We will     proceed with surgery at that time.     Mearl Latin, PA   ______________________________ Doralee Albino. Carola Frost, M.D.    KWP/MEDQ  D:  02/23/2011  T:  02/24/2011  Job:  [id number]  cc:   Ramon Dredge L. Juanetta Gosling, M.D.  Electronically Signed by Montez Morita PA on 03/14/2011 10:54:45 AM Electronically Signed by Myrene Galas M.D. on 03/15/2011 06:51:57 PM

## 2011-03-15 NOTE — Discharge Summary (Signed)
  [name], [name] NO.:  [id number]  MEDICAL RECORD NO.:  [id number]  LOCATION:  5001                         FACILITY:  [facility]  PHYSICIAN:  Doralee Albino. Carola Frost, M.D. DATE OF BIRTH:  04/09/1946  DATE OF ADMISSION:  02/23/2011 DATE OF DISCHARGE:  03/01/2011                        DISCHARGE SUMMARY - REFERRING   ADDENDUM  Please see previous discharge summaries for full hospital course.  I would just like to add to discharge diagnoses secondary hyperparathyroidism with severe vitamin D deficiency with a level of 10 ng/mL.  I suspect that this is the direct stimulus for release of PTH as she is not absorbing adequate levels of calcium, thereby resulting in feedback loop causing release of PTH which is resulting in catabolic activity to the bone and releasing calcium for physiologic functions. This is likely resulting in decreased bone density and has resulted in several fractures thus far.  We will initiate therapy starting with calcium and vitamin D supplementation.  We will continue with the current dose of calcium, and we will change her vitamin D to vitamin D2 - 50,000 International Units weekly for the next 8 weeks.  This will be followed by another laboratory evaluation which will then be followed by either another course of vitamin D2 versus maintenance therapy with vitamin D3 - 2000 IU daily.  Should the patient be refractory to treatment, we may need to initiate additional workup to evaluate for potential causes of poor vitamin D absorption and/or synthesis.  Again, I do not believe that the patient has any renal disease resulting in her low vitamin D levels and elevated PTH levels and would suspect that if additional secondary causes are present, they may be related to malabsorption syndrome within the GI tract.  The patient again will follow up with Orthopedics in 10 days for reevaluation, followup x-rays, and review of these findings in greater  detail.     Mearl Latin, PA   ______________________________ Doralee Albino. Carola Frost, M.D.    KWP/MEDQ  D:  03/01/2011  T:  03/01/2011  Job:  [id number]  Electronically Signed by Montez Morita PA on 03/06/2011 07:49:26 AM Electronically Signed by Myrene Galas M.D. on 03/15/2011 06:52:08 PM

## 2011-03-15 NOTE — Op Note (Signed)
[name], [name] NO.:  [id number]  MEDICAL RECORD NO.:  [id number]  LOCATION:  3735                         FACILITY:  [facility]  PHYSICIAN:  Doralee Albino. Carola Frost, M.D. DATE OF BIRTH:  [birth year]  DATE OF PROCEDURE:  02/24/2011 DATE OF DISCHARGE:                              OPERATIVE REPORT   PREOPERATIVE DIAGNOSIS:  Left tibial shaft fracture.  POSTOPERATIVE DIAGNOSIS:  Left tibial shaft fracture.  PROCEDURE:  Intramedullary nailing of the left tibia using statically locked Pugh 11 x 360-mm nail.  SURGEON:  Doralee Albino. Carola Frost, MD  ASSISTANT:  Mearl Latin, PA  ANESTHESIA:  General.  FINDINGS:  Profound osteoporosis.  ESTIMATED BLOOD LOSS:  100 mL.  DISPOSITION:  To PACU.  CONDITION:  Stable.  BRIEF SUMMARY AND INDICATIONS FOR PROCEDURE:  [name] is a very pleasant 65 year old female who is status post prior ankle repair and syndesmosis who had a presyncopal event and fell.  She was evaluated preoperatively by the Medical Service as well as [facility] Cardiology finding hypokalemia of 2.5 and some EKG changes.  With aggressive supplementation including 60 milliequivalents orally and four runs of 10 milliequivalents IV, she improved and this was evident both in her lab which came back at 2.6 from 2.5 as well as reduced QT delay on her EKG. This was discussed preoperatively with myself, Cardiology and Anesthesia and the consensus opinion was that it would be fine to proceed with continue IV administration of potassium which of course would continue postoperatively as well until her potassium was corrected.  Her hydrochlorothiazide will be held postoperatively and at discharge. Next, I did discuss with the patient preoperatively the risks and benefits of surgery including the possibility of symptomatic hardware failure, he will need for further surgery, DVT, PE, heart attack, stroke, arrhythmia, and multiple others and she did wish to  proceed.  BRIEF DESCRIPTION OF PROCEDURE:  [name] was administered preoperative antibiotics and was taken to the operating room where general anesthesia was induced.  Her right lower extremity was prepped and draped in usual sterile fashion.  No tourniquet was used during the procedure.  A 2.5-cm incision was made at the base of the patella extending proximally, medial parapatellar incision through the retinaculum, curved cannulated awl advanced in center-center position of the proximal tibia.  The guidewire was advanced across the fracture site, which was held reduced. I did make a small stab incision medially and used the ball spike pusher to push through the skin to avoid any soft tissue damage or injury while pulling distraction and correcting her angulation.  It was reamed up to 12 mm.  An 11 x 360-mm placed securing fixation with two static locking holes proximally and a set of three locking bolts distally including two medial to lateral and one anterior to posterior.  We did very carefully evaluate her rotation throughout the procedure.  She had extremely externally rotated ankle on the contralateral side and we did make every attempt to match this and also of course to produce the best possible reduction.  The wounds were irrigated and closed in standard layered fashion after orthogonal views of the implant confirmed appropriate reduction, hardware trajectory  and length.  Montez Morita, PA-C assisted me throughout the procedure, which was absolutely necessary for safe and effective position of the case as I was required to hold reduction while he reamed and he had to control the leg during my placement of the locking bolts.  Furthermore, he did assist me with simultaneous wound closure to further expedite case.  PROGNOSIS:  [name] will be non-weightbear on the left lower extremity. Given the fact that her bone was profoundly soft particularly in the proximal tibia.  We do want  to make sure that she has been optimized with respect to calcium, vitamin D and see whether or not any additional measures may be indicated.  I am concerned about the fact she has been on bisphosphonates for 5 years without a holiday and we will recommend this as well with possible conversion to [facility] if indicated.  She remains at elevated risk of complications given her multiple medical problems including hyperkalemia which will continue to correct both orally and with IV meds.     Doralee Albino. Carola Frost, M.D.     MHH/MEDQ  D:  02/24/2011  T:  02/24/2011  Job:  [id number]  Electronically Signed by Myrene Galas M.D. on 03/15/2011 06:51:49 PM

## 2011-03-16 LAB — GLUCOSE, CAPILLARY: Glucose-Capillary: 87 mg/dL (ref 70–99)

## 2011-03-17 LAB — GLUCOSE, CAPILLARY: Glucose-Capillary: 93 mg/dL (ref 70–99)

## 2011-03-18 LAB — GLUCOSE, CAPILLARY: Glucose-Capillary: 98 mg/dL (ref 70–99)

## 2011-03-19 LAB — GLUCOSE, CAPILLARY: Glucose-Capillary: 94 mg/dL (ref 70–99)

## 2011-03-20 LAB — GLUCOSE, CAPILLARY: Glucose-Capillary: 109 mg/dL — ABNORMAL HIGH (ref 70–99)

## 2011-03-21 LAB — GLUCOSE, CAPILLARY: Glucose-Capillary: 99 mg/dL (ref 70–99)

## 2011-03-22 LAB — GLUCOSE, CAPILLARY: Glucose-Capillary: 85 mg/dL (ref 70–99)

## 2011-03-23 LAB — GLUCOSE, CAPILLARY: Glucose-Capillary: 89 mg/dL (ref 70–99)

## 2011-03-24 LAB — GLUCOSE, CAPILLARY: Glucose-Capillary: 113 mg/dL — ABNORMAL HIGH (ref 70–99)

## 2011-03-25 LAB — GLUCOSE, CAPILLARY: Glucose-Capillary: 107 mg/dL — ABNORMAL HIGH (ref 70–99)

## 2011-03-26 LAB — GLUCOSE, CAPILLARY: Glucose-Capillary: 98 mg/dL (ref 70–99)

## 2011-03-27 LAB — GLUCOSE, CAPILLARY: Glucose-Capillary: 100 mg/dL — ABNORMAL HIGH (ref 70–99)

## 2011-03-28 LAB — GLUCOSE, CAPILLARY: Glucose-Capillary: 107 mg/dL — ABNORMAL HIGH (ref 70–99)

## 2011-03-29 LAB — GLUCOSE, CAPILLARY: Glucose-Capillary: 107 mg/dL — ABNORMAL HIGH (ref 70–99)

## 2011-03-30 LAB — GLUCOSE, CAPILLARY: Glucose-Capillary: 101 mg/dL — ABNORMAL HIGH (ref 70–99)

## 2011-03-31 LAB — GLUCOSE, CAPILLARY: Glucose-Capillary: 92 mg/dL (ref 70–99)

## 2011-04-01 LAB — GLUCOSE, CAPILLARY: Glucose-Capillary: 100 mg/dL — ABNORMAL HIGH (ref 70–99)

## 2011-04-02 LAB — GLUCOSE, CAPILLARY: Glucose-Capillary: 110 mg/dL — ABNORMAL HIGH (ref 70–99)

## 2011-04-03 LAB — GLUCOSE, CAPILLARY: Glucose-Capillary: 100 mg/dL — ABNORMAL HIGH (ref 70–99)

## 2011-04-04 LAB — GLUCOSE, CAPILLARY: Glucose-Capillary: 129 mg/dL — ABNORMAL HIGH (ref 70–99)

## 2011-04-05 LAB — GLUCOSE, CAPILLARY: Glucose-Capillary: 107 mg/dL — ABNORMAL HIGH (ref 70–99)

## 2011-04-06 LAB — GLUCOSE, CAPILLARY: Glucose-Capillary: 102 mg/dL — ABNORMAL HIGH (ref 70–99)

## 2011-04-07 LAB — GLUCOSE, CAPILLARY: Glucose-Capillary: 99 mg/dL (ref 70–99)

## 2011-04-07 NOTE — Progress Notes (Signed)
[name], [name] NO.:  [id number]  MEDICAL RECORD NO.:  [id number]  LOCATION:  S111                          FACILITY:  [facility]  PHYSICIAN:  Saloma Cadena L. Juanetta Gosling, M.D.DATE OF BIRTH:  30-Jul-1946  DATE OF PROCEDURE:  04/07/2011 DATE OF DISCHARGE:                                PROGRESS NOTE   [name] is admitted to the [facility] with a fracture in her leg.  She had a great deal of difficulty getting her pain under control but once she got a pain under control, she was able to do rehab, and she is now ready for discharge home next week.  She has no new complaints.  She still has some problems with pain.  She has history of sarcoidosis and diabetes and hypertension.  Her exam now shows that she is awake and alert, looks pretty comfortable.  Heart is regular.  Abdomen is soft. Extremities showed no edema and she looks better in general as mentioned.  Assessment is that she is better.  Plan, continue her current treatments medications.  Plan for discharge early next week.  I do not plan to change anything else.     Twylla Arceneaux L. Juanetta Gosling, M.D.     ELH/MEDQ  D:  04/07/2011  T:  04/07/2011  Job:  [id number]

## 2011-04-08 LAB — GLUCOSE, CAPILLARY: Glucose-Capillary: 96 mg/dL (ref 70–99)

## 2011-04-09 LAB — GLUCOSE, CAPILLARY: Glucose-Capillary: 121 mg/dL — ABNORMAL HIGH (ref 70–99)

## 2011-05-21 LAB — BLOOD GAS, ARTERIAL
Acid-base deficit: 4 — ABNORMAL HIGH
Bicarbonate: 19.5 — ABNORMAL LOW
FIO2: 0.21
O2 Saturation: 94.8
Patient temperature: 37
TCO2: 17.7
pCO2 arterial: 29.7 — ABNORMAL LOW
pH, Arterial: 7.433 — ABNORMAL HIGH
pO2, Arterial: 75.9 — ABNORMAL LOW

## 2011-05-28 LAB — BLOOD GAS, ARTERIAL
Acid-Base Excess: 0.2
Bicarbonate: 24.6 — ABNORMAL HIGH
FIO2: 21
O2 Saturation: 95
Patient temperature: 37
TCO2: 22.6
pCO2 arterial: 41.7
pH, Arterial: 7.389
pO2, Arterial: 87.2

## 2011-07-03 ENCOUNTER — Other Ambulatory Visit (HOSPITAL_COMMUNITY): Payer: Self-pay | Admitting: Pulmonary Disease

## 2011-07-05 ENCOUNTER — Ambulatory Visit (HOSPITAL_COMMUNITY)
Admission: RE | Admit: 2011-07-05 | Discharge: 2011-07-05 | Disposition: A | Discharge status: Home / Self Care | Payer: 59 | Source: Ambulatory Visit | Attending: Pulmonary Disease | Admitting: Pulmonary Disease

## 2011-07-05 DIAGNOSIS — M899 Disorder of bone, unspecified: Secondary | ICD-10-CM | POA: Insufficient documentation

## 2011-07-05 DIAGNOSIS — M949 Disorder of cartilage, unspecified: Secondary | ICD-10-CM | POA: Insufficient documentation

## 2011-09-05 ENCOUNTER — Other Ambulatory Visit (HOSPITAL_COMMUNITY): Payer: Self-pay | Admitting: Pulmonary Disease

## 2011-09-05 DIAGNOSIS — Z139 Encounter for screening, unspecified: Secondary | ICD-10-CM

## 2011-10-18 ENCOUNTER — Ambulatory Visit (HOSPITAL_COMMUNITY)
Admission: RE | Admit: 2011-10-18 | Discharge: 2011-10-18 | Disposition: A | Discharge status: Home / Self Care | Payer: 59 | Source: Ambulatory Visit | Attending: Pulmonary Disease | Admitting: Pulmonary Disease

## 2011-10-18 DIAGNOSIS — Z1231 Encounter for screening mammogram for malignant neoplasm of breast: Secondary | ICD-10-CM | POA: Insufficient documentation

## 2011-10-18 DIAGNOSIS — Z139 Encounter for screening, unspecified: Secondary | ICD-10-CM

## 2012-10-02 ENCOUNTER — Ambulatory Visit (HOSPITAL_COMMUNITY)
Admission: RE | Admit: 2012-10-02 | Discharge: 2012-10-02 | Disposition: A | Discharge status: Home / Self Care | Payer: Medicare Other | Source: Ambulatory Visit | Attending: Pulmonary Disease | Admitting: Pulmonary Disease

## 2012-10-02 DIAGNOSIS — I517 Cardiomegaly: Secondary | ICD-10-CM

## 2012-10-02 DIAGNOSIS — R609 Edema, unspecified: Secondary | ICD-10-CM | POA: Insufficient documentation

## 2012-10-02 DIAGNOSIS — I10 Essential (primary) hypertension: Secondary | ICD-10-CM | POA: Insufficient documentation

## 2012-10-02 DIAGNOSIS — E119 Type 2 diabetes mellitus without complications: Secondary | ICD-10-CM | POA: Insufficient documentation

## 2012-10-02 DIAGNOSIS — I059 Rheumatic mitral valve disease, unspecified: Secondary | ICD-10-CM | POA: Insufficient documentation

## 2012-10-02 NOTE — Progress Notes (Signed)
*  PRELIMINARY RESULTS* Echocardiogram 2D Echocardiogram has been performed.  Conrad Sampson 10/02/2012, 3:35 PM

## 2013-02-05 IMAGING — CR DG TIBIA/FIBULA PORT 2V*L*
4 series · 4 of 4 positions shown · non-contrast
Comparison: Intraoperative films same date.  Preoperative films of
1 day prior.

CLINICAL DATA: Postop tibial fixation.

PORTABLE LEFT TIBIA AND FIBULA - 2 VIEW

[AP (1 of 2)]
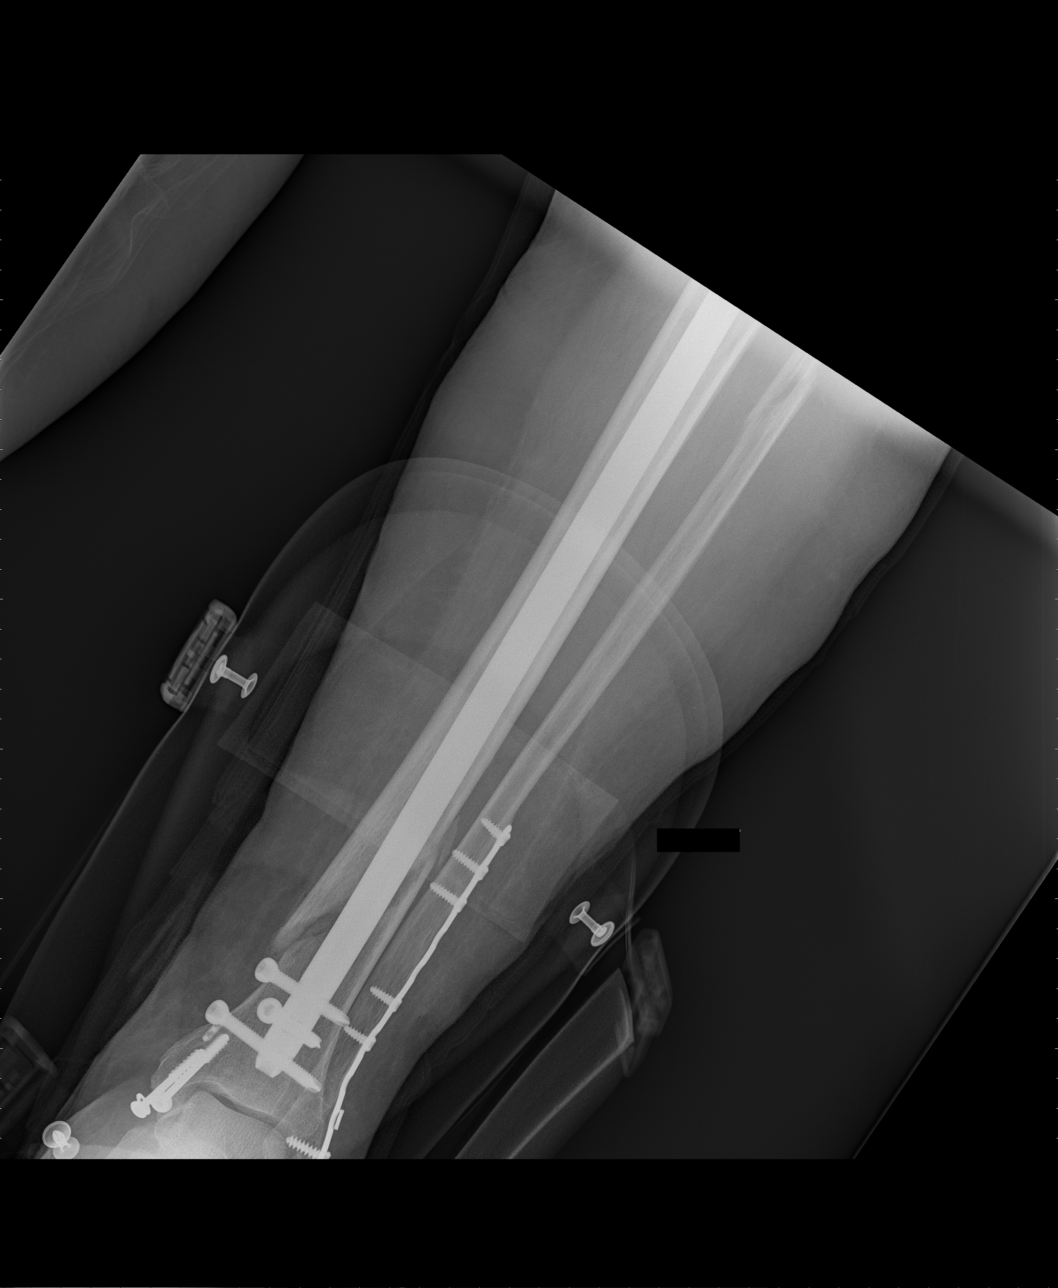

[AP (2 of 2)]
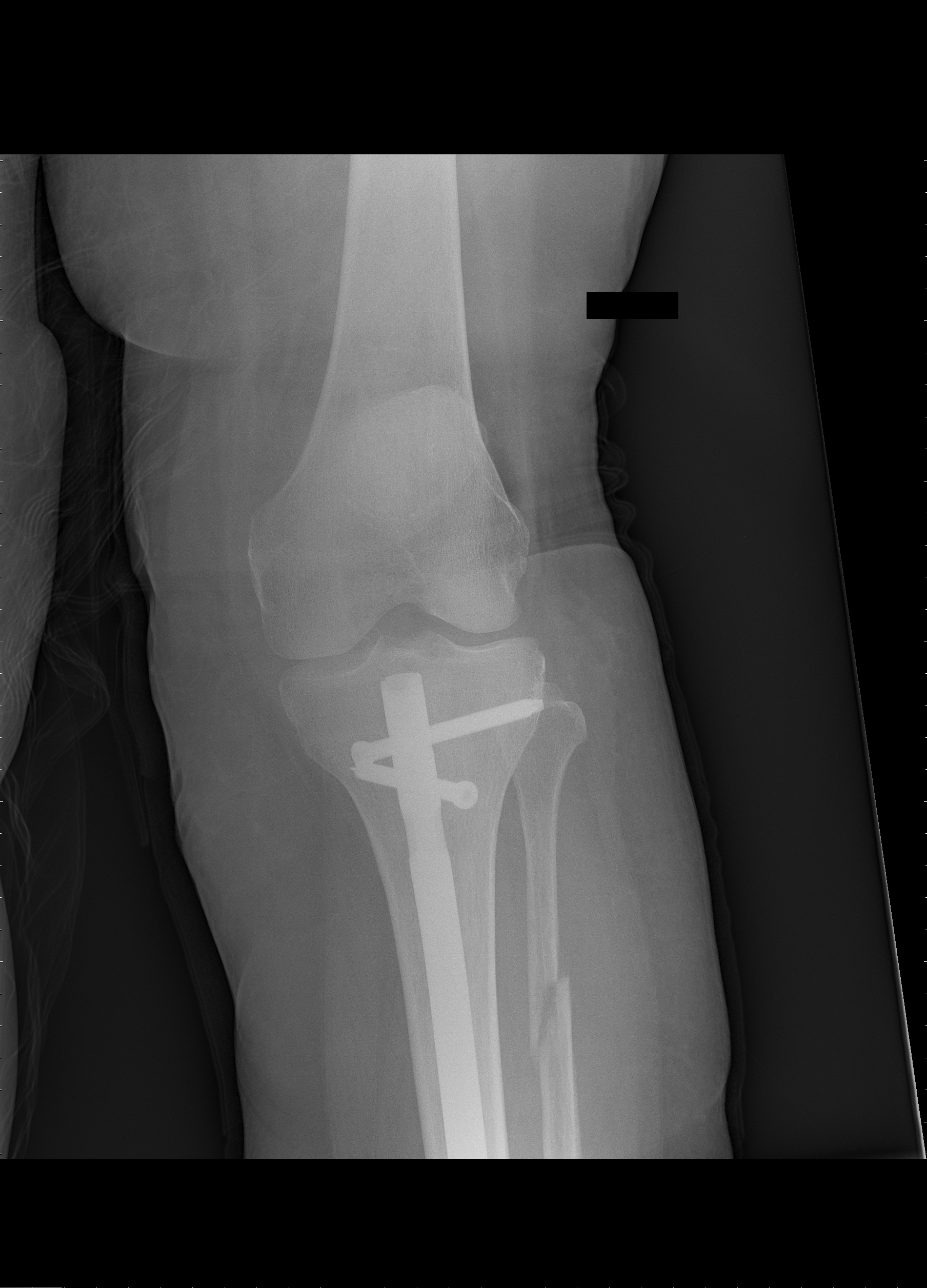

[lat tib/fib (1 of 2)]
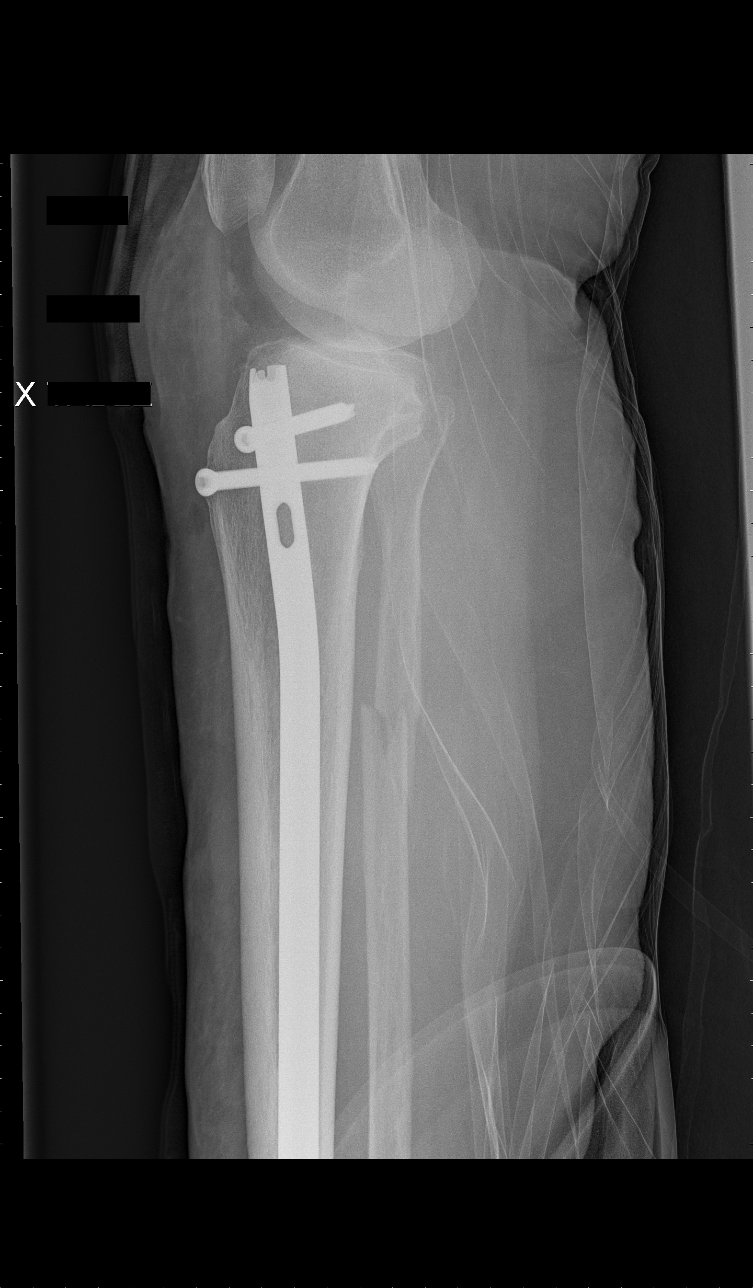

[lat tib/fib (2 of 2)]
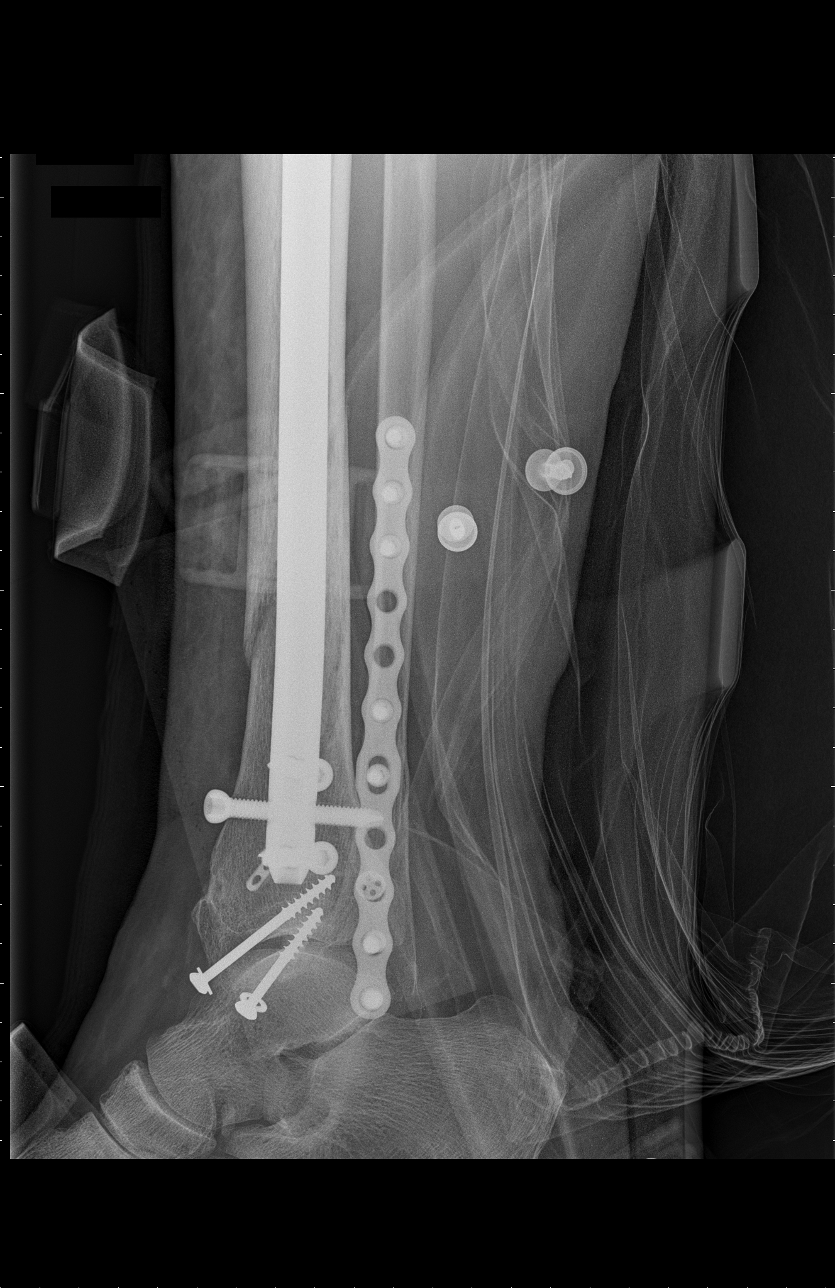

[4 of 4 positions shown; findings below may reference images not displayed]

FINDINGS: Lateral plate and screw fixation of the distal fibula.
Screw fixation of the medial malleolus.  These are both similar in
appearance to on the preoperative study.

Placement of an intramedullary rod with 3 distal and 2 proximal
locking screws.  The alignment of the spiral fracture of the distal
tibia is improved.There is a proximal to mid fibular fracture which
is minimally displaced and acute.  Incidental note is made of air
within the suprapatellar bursa of the knee, likely postoperative.
No acute hardware complication identified.
IMPRESSION: Interval placement of intermedullary rod within the tibia.

Proximal to mid fibular shaft fracture.

## 2013-02-05 IMAGING — RF DG TIBIA/FIBULA 2V*L*
1 series · 7 of 7 positions shown · non-contrast
Comparison: 1 day prior

CLINICAL DATA: Tibial fracture.

LEFT TIBIA AND FIBULA - 2 VIEW

[Series 1: run · 7 of 7 slices shown]
[im 1/7]
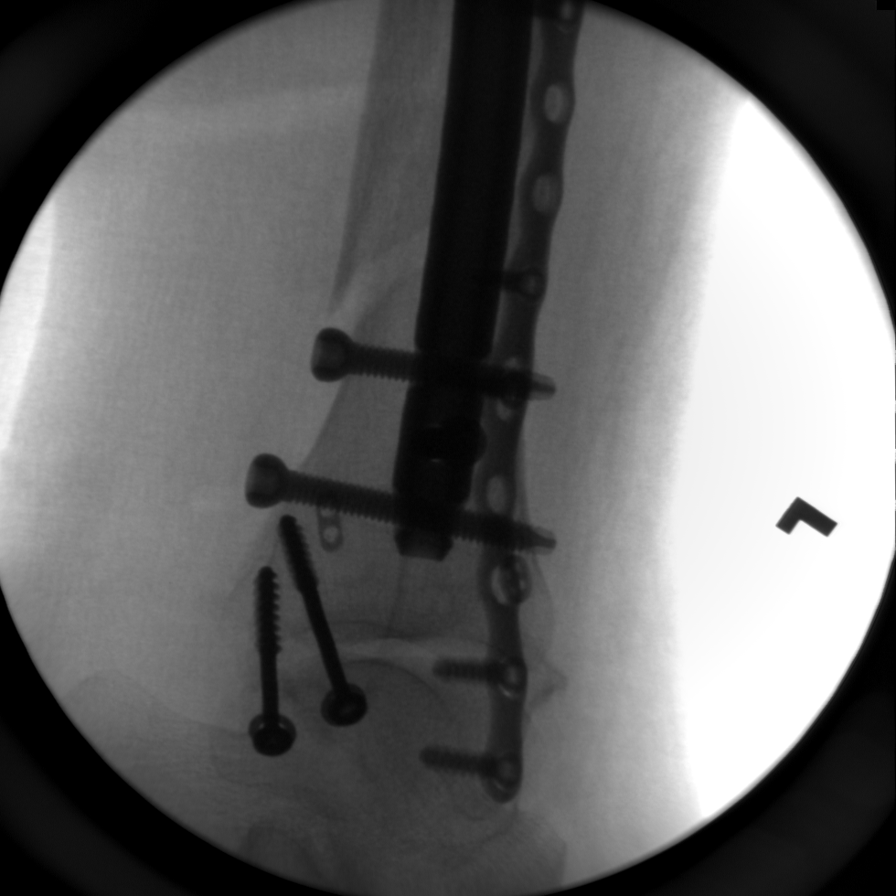
[im 2/7]
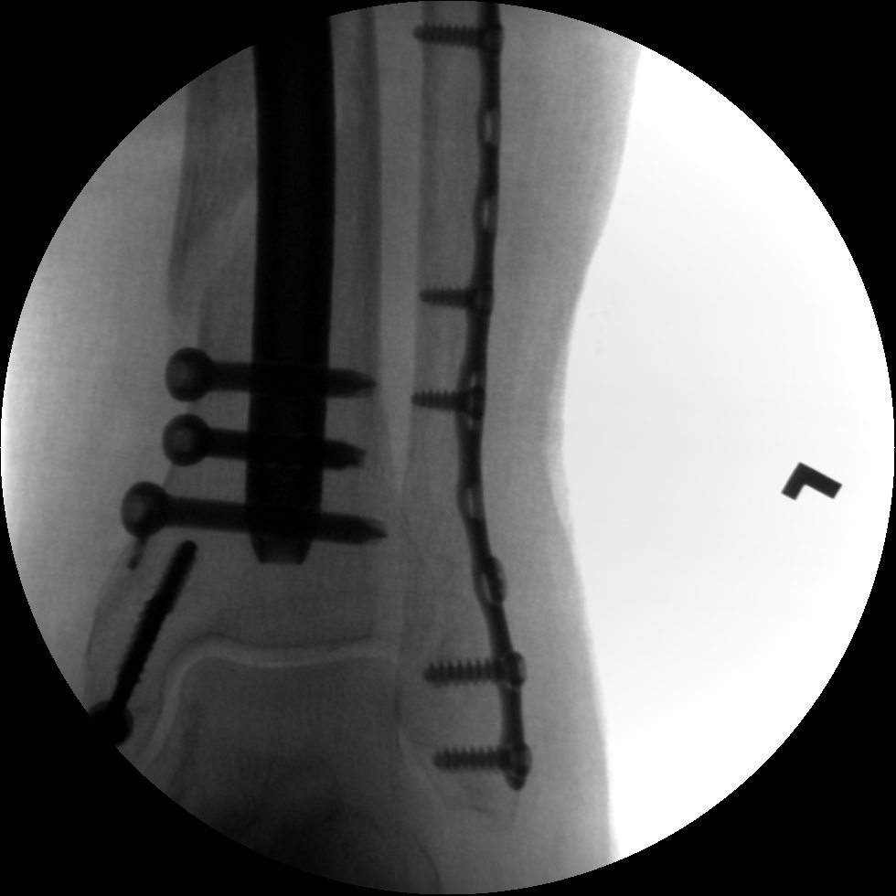
[im 3/7]
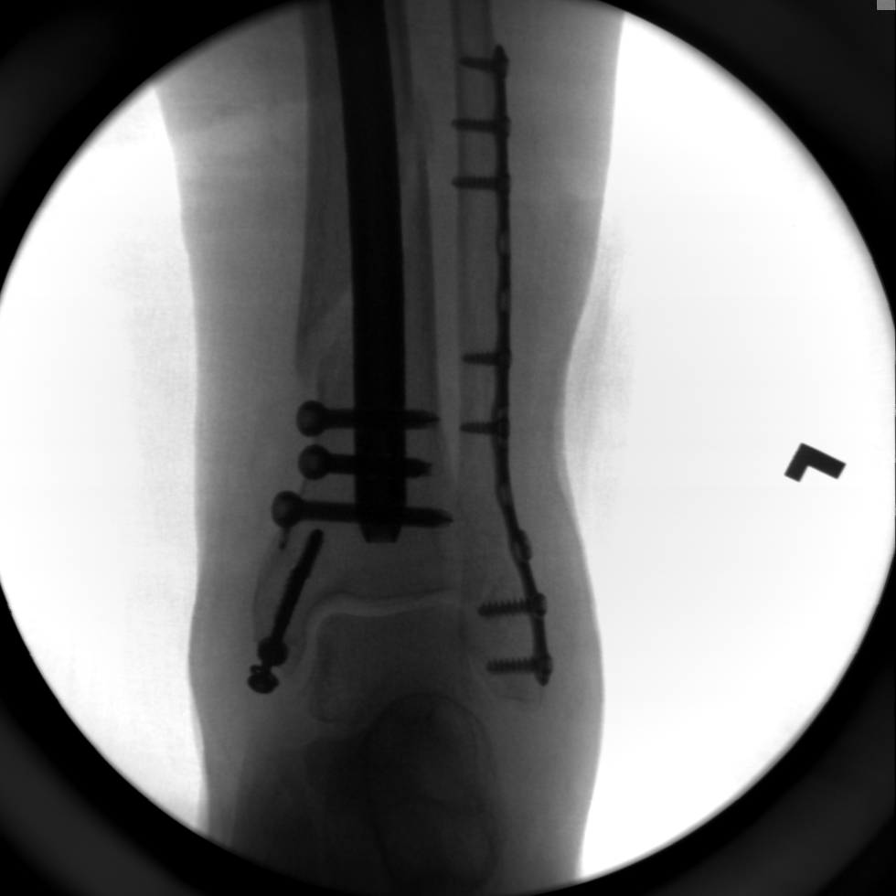
[im 4/7]
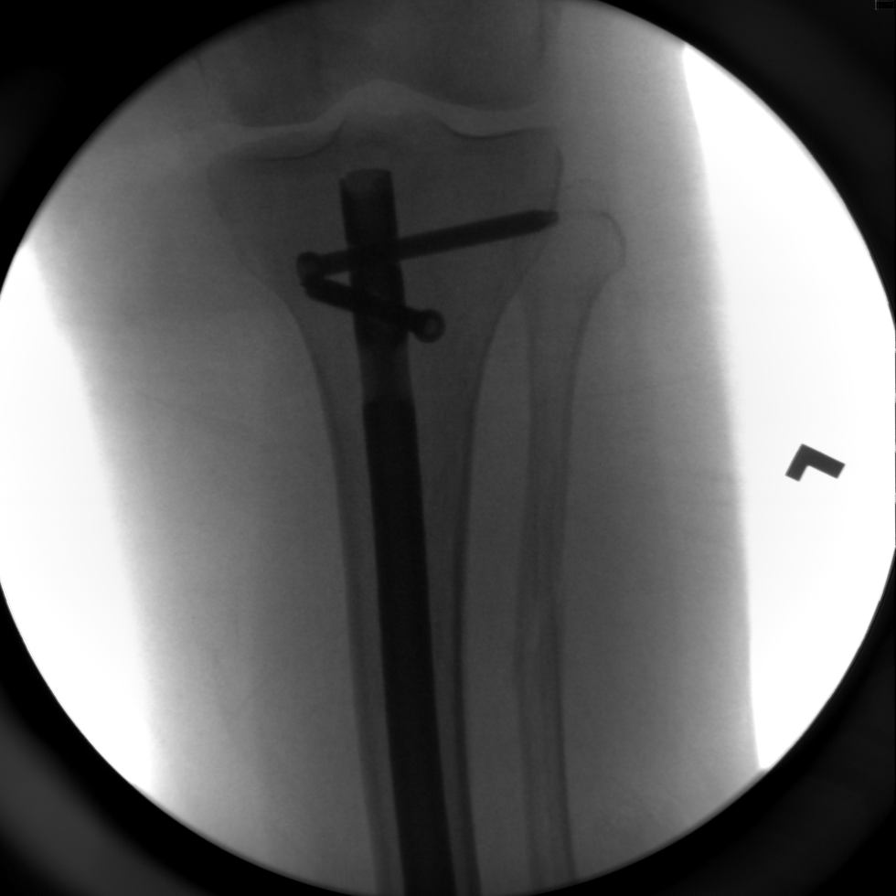
[im 5/7]
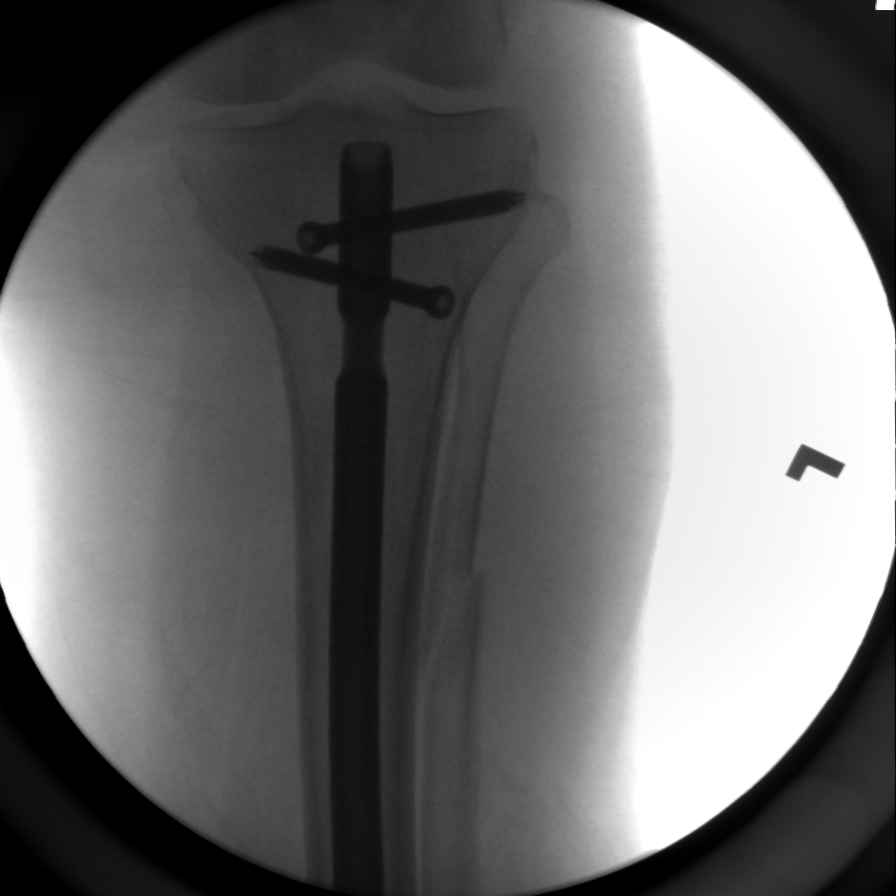
[im 6/7]
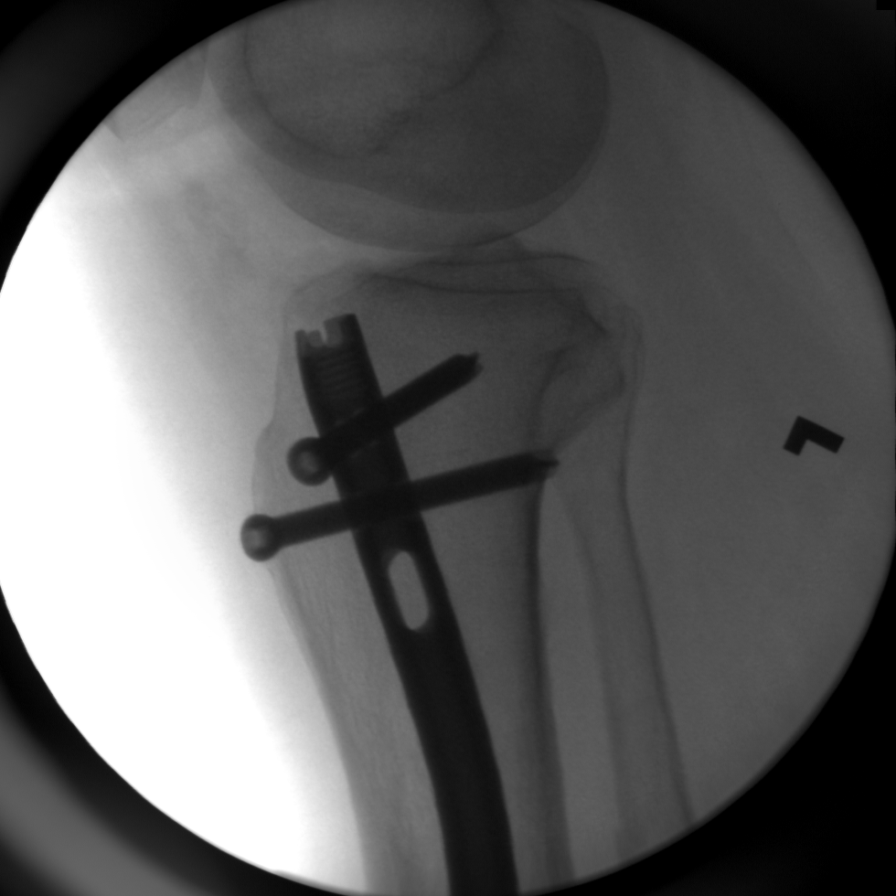
[im 7/7]
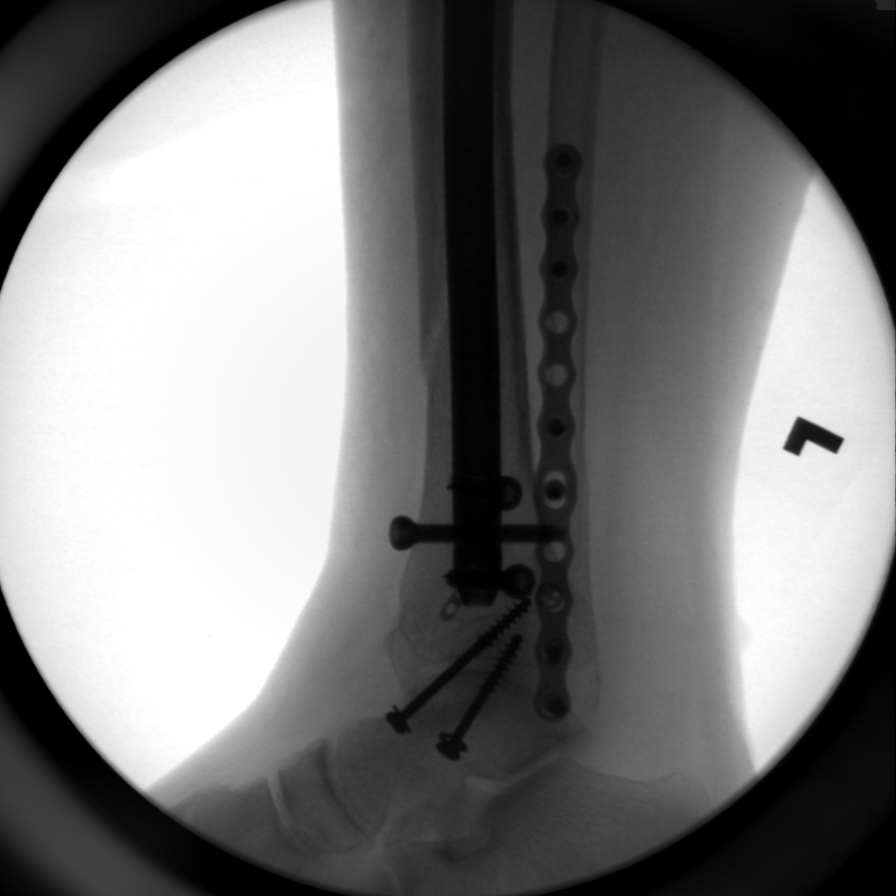

[7 of 7 positions shown; findings below may reference images not displayed]

FINDINGS: 7 intraoperative images.  There has been remote lateral
plate and screw fixation of the distal fibula.  Screw fixation of
the medial malleolus is also remote.

Interval placement of intermedullary rod with 3 distal and 2
proximal locking screws.  Improved alignment in the sagittal plane
of the previously described distal tibial shaft fracture.  A
proximal fibular spiral fracture is minimally displaced and appears
acute.
IMPRESSION: Intraoperative imaging of intermedullary rod placement within the
tibia.

## 2013-11-11 ENCOUNTER — Other Ambulatory Visit (HOSPITAL_COMMUNITY): Payer: Self-pay | Admitting: Pulmonary Disease

## 2013-11-11 DIAGNOSIS — Z780 Asymptomatic menopausal state: Secondary | ICD-10-CM

## 2013-11-19 ENCOUNTER — Ambulatory Visit (HOSPITAL_COMMUNITY)
Admission: RE | Admit: 2013-11-19 | Discharge: 2013-11-19 | Disposition: A | Discharge status: Home / Self Care | Payer: Medicare Other | Source: Ambulatory Visit | Attending: Pulmonary Disease | Admitting: Pulmonary Disease

## 2013-11-19 DIAGNOSIS — Z780 Asymptomatic menopausal state: Secondary | ICD-10-CM

## 2013-11-19 DIAGNOSIS — M899 Disorder of bone, unspecified: Secondary | ICD-10-CM | POA: Insufficient documentation

## 2013-11-19 DIAGNOSIS — M949 Disorder of cartilage, unspecified: Secondary | ICD-10-CM

## 2013-11-19 DIAGNOSIS — E559 Vitamin D deficiency, unspecified: Secondary | ICD-10-CM | POA: Insufficient documentation

## 2014-01-19 ENCOUNTER — Ambulatory Visit (HOSPITAL_COMMUNITY)
Admission: RE | Admit: 2014-01-19 | Discharge: 2014-01-19 | Disposition: A | Discharge status: Home / Self Care | Payer: Medicare Other | Source: Ambulatory Visit | Attending: Pulmonary Disease | Admitting: Pulmonary Disease

## 2014-01-19 ENCOUNTER — Other Ambulatory Visit (HOSPITAL_COMMUNITY): Payer: Self-pay | Admitting: Pulmonary Disease

## 2014-01-19 DIAGNOSIS — M810 Age-related osteoporosis without current pathological fracture: Secondary | ICD-10-CM | POA: Insufficient documentation

## 2014-01-19 DIAGNOSIS — M25562 Pain in left knee: Secondary | ICD-10-CM

## 2014-01-19 DIAGNOSIS — M25569 Pain in unspecified knee: Secondary | ICD-10-CM | POA: Insufficient documentation

## 2014-03-02 ENCOUNTER — Observation Stay (HOSPITAL_COMMUNITY)
Admission: EM | Admit: 2014-03-02 | Discharge: 2014-03-05 | Disposition: A | Discharge status: Home / Self Care | Payer: Medicare Other | Attending: Pulmonary Disease | Admitting: Pulmonary Disease

## 2014-03-02 ENCOUNTER — Emergency Department (HOSPITAL_COMMUNITY): Payer: Medicare Other

## 2014-03-02 ENCOUNTER — Inpatient Hospital Stay (HOSPITAL_COMMUNITY): Payer: Medicare Other

## 2014-03-02 ENCOUNTER — Encounter (HOSPITAL_COMMUNITY): Payer: Self-pay | Admitting: Emergency Medicine

## 2014-03-02 DIAGNOSIS — M810 Age-related osteoporosis without current pathological fracture: Secondary | ICD-10-CM | POA: Insufficient documentation

## 2014-03-02 DIAGNOSIS — R443 Hallucinations, unspecified: Secondary | ICD-10-CM | POA: Insufficient documentation

## 2014-03-02 DIAGNOSIS — R4182 Altered mental status, unspecified: Secondary | ICD-10-CM | POA: Diagnosis present

## 2014-03-02 DIAGNOSIS — D869 Sarcoidosis, unspecified: Secondary | ICD-10-CM

## 2014-03-02 DIAGNOSIS — E119 Type 2 diabetes mellitus without complications: Secondary | ICD-10-CM | POA: Insufficient documentation

## 2014-03-02 DIAGNOSIS — I2510 Atherosclerotic heart disease of native coronary artery without angina pectoris: Secondary | ICD-10-CM | POA: Insufficient documentation

## 2014-03-02 DIAGNOSIS — N289 Disorder of kidney and ureter, unspecified: Secondary | ICD-10-CM | POA: Insufficient documentation

## 2014-03-02 DIAGNOSIS — M129 Arthropathy, unspecified: Secondary | ICD-10-CM | POA: Insufficient documentation

## 2014-03-02 DIAGNOSIS — Z8619 Personal history of other infectious and parasitic diseases: Secondary | ICD-10-CM | POA: Insufficient documentation

## 2014-03-02 DIAGNOSIS — G8929 Other chronic pain: Secondary | ICD-10-CM | POA: Insufficient documentation

## 2014-03-02 DIAGNOSIS — Z79899 Other long term (current) drug therapy: Secondary | ICD-10-CM | POA: Insufficient documentation

## 2014-03-02 DIAGNOSIS — G9340 Encephalopathy, unspecified: Principal | ICD-10-CM | POA: Insufficient documentation

## 2014-03-02 DIAGNOSIS — M545 Low back pain, unspecified: Secondary | ICD-10-CM

## 2014-03-02 DIAGNOSIS — M25569 Pain in unspecified knee: Secondary | ICD-10-CM | POA: Insufficient documentation

## 2014-03-02 DIAGNOSIS — F29 Unspecified psychosis not due to a substance or known physiological condition: Secondary | ICD-10-CM | POA: Insufficient documentation

## 2014-03-02 DIAGNOSIS — M549 Dorsalgia, unspecified: Secondary | ICD-10-CM

## 2014-03-02 HISTORY — DX: Age-related osteoporosis without current pathological fracture: M81.0

## 2014-03-02 HISTORY — DX: Disorder of kidney and ureter, unspecified: N28.9

## 2014-03-02 HISTORY — DX: Unspecified osteoarthritis, unspecified site: M19.90

## 2014-03-02 HISTORY — DX: Type 2 diabetes mellitus without complications: E11.9

## 2014-03-02 HISTORY — DX: Sarcoidosis, unspecified: D86.9

## 2014-03-02 HISTORY — DX: Encephalopathy, unspecified: G93.40

## 2014-03-02 HISTORY — DX: Atherosclerotic heart disease of native coronary artery without angina pectoris: I25.10

## 2014-03-02 LAB — URINE MICROSCOPIC-ADD ON

## 2014-03-02 LAB — CBC WITH DIFFERENTIAL/PLATELET
Basophils Absolute: 0 10*3/uL (ref 0.0–0.1)
Basophils Relative: 0 % (ref 0–1)
Eosinophils Absolute: 0 10*3/uL (ref 0.0–0.7)
Eosinophils Relative: 0 % (ref 0–5)
HCT: 39.5 % (ref 36.0–46.0)
Hemoglobin: 13.2 g/dL (ref 12.0–15.0)
Lymphocytes Relative: 15 % (ref 12–46)
Lymphs Abs: 1.6 10*3/uL (ref 0.7–4.0)
MCH: 27.8 pg (ref 26.0–34.0)
MCHC: 33.4 g/dL (ref 30.0–36.0)
MCV: 83.2 fL (ref 78.0–100.0)
Monocytes Absolute: 0.6 10*3/uL (ref 0.1–1.0)
Monocytes Relative: 6 % (ref 3–12)
Neutro Abs: 8.3 10*3/uL — ABNORMAL HIGH (ref 1.7–7.7)
Neutrophils Relative %: 79 % — ABNORMAL HIGH (ref 43–77)
Platelets: 279 10*3/uL (ref 150–400)
RBC: 4.75 MIL/uL (ref 3.87–5.11)
RDW: 15.2 % (ref 11.5–15.5)
WBC: 10.5 10*3/uL (ref 4.0–10.5)

## 2014-03-02 LAB — COMPREHENSIVE METABOLIC PANEL
ALT: 16 U/L (ref 0–35)
AST: 33 U/L (ref 0–37)
Albumin: 4 g/dL (ref 3.5–5.2)
Alkaline Phosphatase: 75 U/L (ref 39–117)
Anion gap: 18 — ABNORMAL HIGH (ref 5–15)
BUN: 20 mg/dL (ref 6–23)
CO2: 22 mEq/L (ref 19–32)
Calcium: 9.7 mg/dL (ref 8.4–10.5)
Chloride: 101 mEq/L (ref 96–112)
Creatinine, Ser: 1.38 mg/dL — ABNORMAL HIGH (ref 0.50–1.10)
GFR calc Af Amer: 45 mL/min — ABNORMAL LOW (ref >90)
GFR calc non Af Amer: 39 mL/min — ABNORMAL LOW (ref >90)
Glucose, Bld: 175 mg/dL — ABNORMAL HIGH (ref 70–99)
Potassium: 3.3 mEq/L — ABNORMAL LOW (ref 3.7–5.3)
Sodium: 141 mEq/L (ref 137–147)
Total Bilirubin: 0.5 mg/dL (ref 0.3–1.2)
Total Protein: 8.4 g/dL — ABNORMAL HIGH (ref 6.0–8.3)

## 2014-03-02 LAB — URINALYSIS, ROUTINE W REFLEX MICROSCOPIC
Glucose, UA: NEGATIVE mg/dL
Ketones, ur: 15 mg/dL — AB
Leukocytes, UA: NEGATIVE
Nitrite: NEGATIVE
Protein, ur: 100 mg/dL — AB
Specific Gravity, Urine: 1.02 (ref 1.005–1.030)
Urobilinogen, UA: 0.2 mg/dL (ref 0.0–1.0)
pH: 6 (ref 5.0–8.0)

## 2014-03-02 LAB — TROPONIN I
Troponin I: <0.3 ng/mL (ref <0.30)
Troponin I: <0.3 ng/mL (ref <0.30)

## 2014-03-02 LAB — GLUCOSE, CAPILLARY: Glucose-Capillary: 121 mg/dL — ABNORMAL HIGH (ref 70–99)

## 2014-03-02 MED ORDER — POTASSIUM CHLORIDE CRYS ER 20 MEQ PO TBCR
40.0000 meq | EXTENDED_RELEASE_TABLET | Freq: Once | ORAL | Status: AC
  Administered 2014-03-02: 40 meq via ORAL
  Filled 2014-03-02: qty 2

## 2014-03-02 MED ORDER — LISINOPRIL 5 MG PO TABS
2.5000 mg | ORAL_TABLET | Freq: Every day | ORAL | Status: DC
  Administered 2014-03-02 – 2014-03-05 (×4): 2.5 mg via ORAL
  Filled 2014-03-02 (×4): qty 1

## 2014-03-02 MED ORDER — ENOXAPARIN SODIUM 40 MG/0.4ML ~~LOC~~ SOLN
40.0000 mg | SUBCUTANEOUS | Status: DC
  Administered 2014-03-02 – 2014-03-04 (×3): 40 mg via SUBCUTANEOUS
  Filled 2014-03-02 (×3): qty 0.4

## 2014-03-02 MED ORDER — ACETAMINOPHEN 325 MG PO TABS: 650.0000 mg | ORAL_TABLET | Freq: Four times a day (QID) | ORAL | Status: DC | PRN

## 2014-03-02 MED ORDER — HYDROXYCHLOROQUINE SULFATE 200 MG PO TABS
ORAL_TABLET | ORAL | Status: AC
  Filled 2014-03-02: qty 1

## 2014-03-02 MED ORDER — ONDANSETRON HCL 4 MG/2ML IJ SOLN: 4.0000 mg | Freq: Four times a day (QID) | INTRAMUSCULAR | Status: DC | PRN

## 2014-03-02 MED ORDER — FENTANYL 50 MCG/HR TD PT72
50.0000 ug | MEDICATED_PATCH | TRANSDERMAL | Status: DC
  Administered 2014-03-03: 50 ug via TRANSDERMAL
  Filled 2014-03-02: qty 1

## 2014-03-02 MED ORDER — PANTOPRAZOLE SODIUM 40 MG PO TBEC
40.0000 mg | DELAYED_RELEASE_TABLET | Freq: Two times a day (BID) | ORAL | Status: DC
  Administered 2014-03-02 – 2014-03-05 (×6): 40 mg via ORAL
  Filled 2014-03-02 (×6): qty 1

## 2014-03-02 MED ORDER — OXYCODONE HCL 5 MG PO CAPS
10.0000 mg | ORAL_CAPSULE | Freq: Four times a day (QID) | ORAL | Status: DC | PRN
  Filled 2014-03-02: qty 2

## 2014-03-02 MED ORDER — INSULIN ASPART 100 UNIT/ML ~~LOC~~ SOLN
0.0000 [IU] | Freq: Three times a day (TID) | SUBCUTANEOUS | Status: DC
  Administered 2014-03-03 – 2014-03-05 (×6): 1 [IU] via SUBCUTANEOUS

## 2014-03-02 MED ORDER — ONDANSETRON HCL 4 MG PO TABS: 4.0000 mg | ORAL_TABLET | Freq: Four times a day (QID) | ORAL | Status: DC | PRN

## 2014-03-02 MED ORDER — ACETAMINOPHEN 650 MG RE SUPP: 650.0000 mg | Freq: Four times a day (QID) | RECTAL | Status: DC | PRN

## 2014-03-02 MED ORDER — SODIUM CHLORIDE 0.9 % IJ SOLN
3.0000 mL | Freq: Two times a day (BID) | INTRAMUSCULAR | Status: DC
  Administered 2014-03-02 – 2014-03-05 (×4): 3 mL via INTRAVENOUS

## 2014-03-02 MED ORDER — HYDROXYCHLOROQUINE SULFATE 200 MG PO TABS
200.0000 mg | ORAL_TABLET | Freq: Every day | ORAL | Status: DC
  Administered 2014-03-02 – 2014-03-05 (×4): 200 mg via ORAL
  Filled 2014-03-02 (×7): qty 1

## 2014-03-02 MED ORDER — ESCITALOPRAM OXALATE 10 MG PO TABS
20.0000 mg | ORAL_TABLET | Freq: Every day | ORAL | Status: DC
  Administered 2014-03-02 – 2014-03-04 (×3): 20 mg via ORAL
  Filled 2014-03-02 (×3): qty 2

## 2014-03-02 MED ORDER — AMLODIPINE BESYLATE 5 MG PO TABS
5.0000 mg | ORAL_TABLET | Freq: Every day | ORAL | Status: DC
  Administered 2014-03-02 – 2014-03-05 (×4): 5 mg via ORAL
  Filled 2014-03-02 (×4): qty 1

## 2014-03-02 MED ORDER — SIMVASTATIN 20 MG PO TABS
40.0000 mg | ORAL_TABLET | Freq: Every day | ORAL | Status: DC
  Administered 2014-03-02 – 2014-03-04 (×3): 40 mg via ORAL
  Filled 2014-03-02 (×3): qty 2

## 2014-03-02 NOTE — H&P (Addendum)
PCP:   Fredirick Maudlin, MD   Chief Complaint:  Altered mental status  HPI:  68 year old [name] who  has a past medical history of Coronary artery disease; Diabetes mellitus without complication; Arthritis; Sarcoidosis; Renal disorder; and Osteoporosis. Today was brought to the ED with chief complaint of confusion and hallucination started 3 days ago. As per husband, patient has been having hard time distinguishing dreams from reality. Patient has been taking Lyrica for neuropathy and also opiates including fentanyl and oxycodone for chronic back pain. As per husband recover started about 2 months ago she was supposed take 1 tablet 3 times a day, but he noted yesterday that she was taking 2 pills at a time. Patient at this time is confused, though she complains of intermittent chest pain. She denies nausea vomiting or diarrhea. No shortness of breath no dysuria urgency frequency of urination. No fever.  Allergies:   Allergies  Allergen Reactions  . Darvon [Propoxyphene] Nausea And Vomiting  . Morphine And Related Itching and Rash  . Penicillins Itching and Rash      Past Medical History  Diagnosis Date  . Coronary artery disease   . Diabetes mellitus without complication   . Arthritis   . Sarcoidosis   . Renal disorder   . Osteoporosis     Past Surgical History  Procedure Laterality Date  . Fracture surgery    . Back surgery      Prior to Admission medications   Medication Sig Start Date End Date Taking? Authorizing Provider  alendronate (FOSAMAX) 70 MG tablet Take 70 mg by mouth once a week. Take with a full glass of water on an empty stomach.   Yes Historical Provider, MD  amLODipine (NORVASC) 5 MG tablet Take 5 mg by mouth daily.   Yes Historical Provider, MD  Cyanocobalamin (B-12 PO) Take 1 tablet by mouth daily.   Yes Historical Provider, MD  darifenacin (ENABLEX) 15 MG 24 hr tablet Take 15 mg by mouth daily.   Yes Historical Provider, MD  escitalopram (LEXAPRO) 20  MG tablet Take 20 mg by mouth at bedtime.    Yes Historical Provider, MD  fentaNYL (DURAGESIC - DOSED MCG/HR) 50 MCG/HR Place 50 mcg onto the skin every 3 (three) days.   Yes Historical Provider, MD  furosemide (LASIX) 40 MG tablet Take 40 mg by mouth 2 (two) times daily.   Yes Historical Provider, MD  glipiZIDE (GLUCOTROL XL) 5 MG 24 hr tablet Take 5 mg by mouth daily with breakfast.   Yes Historical Provider, MD  hydroxychloroquine (PLAQUENIL) 200 MG tablet Take 200 mg by mouth daily.   Yes Historical Provider, MD  lisinopril (PRINIVIL,ZESTRIL) 2.5 MG tablet Take 2.5 mg by mouth daily.   Yes Historical Provider, MD  methocarbamol (ROBAXIN) 500 MG tablet Take 500 mg by mouth every 6 (six) hours as needed for muscle spasms.   Yes Historical Provider, MD  Multiple Vitamin (MULTIVITAMIN WITH MINERALS) TABS tablet Take 1 tablet by mouth daily.   Yes Historical Provider, MD  oxycodone (OXY-IR) 5 MG capsule Take 10 mg by mouth 4 (four) times daily as needed for pain.   Yes Historical Provider, MD  pantoprazole (PROTONIX) 40 MG tablet Take 40 mg by mouth 2 (two) times daily.   Yes Historical Provider, MD  potassium chloride SA (K-DUR,KLOR-CON) 20 MEQ tablet Take 20 mEq by mouth 2 (two) times daily.   Yes Historical Provider, MD  pregabalin (LYRICA) 50 MG capsule Take 50 mg by mouth 3 (three)  times daily.   Yes Historical Provider, MD  Pyridoxine HCl (B-6 PO) Take 1 tablet by mouth daily.   Yes Historical Provider, MD  simvastatin (ZOCOR) 40 MG tablet Take 40 mg by mouth at bedtime.   Yes Historical Provider, MD  sodium bicarbonate 650 MG tablet Take 650 mg by mouth 2 (two) times daily.   Yes Historical Provider, MD  solifenacin (VESICARE) 5 MG tablet Take 5 mg by mouth daily.   Yes Historical Provider, MD  Vitamin D, Ergocalciferol, (DRISDOL) 50000 UNITS CAPS capsule Take 50,000 Units by mouth every 7 (seven) days. *take once weekly on the same day each week*   Yes Historical Provider, MD    Social  History:  reports that she has never smoked. She does not have any smokeless tobacco history on file. She reports that she does not drink alcohol or use illicit drugs.    All the positives are listed in BOLD  Review of Systems:  HEENT: Headache, blurred vision, runny nose, sore throat Neck: Hypothyroidism, hyperthyroidism,,lymphadenopathy Chest : Shortness of breath, history of COPD, Asthma Heart : Chest pain, history of coronary arterey disease GI:  Nausea, vomiting, diarrhea, constipation, GERD GU: Dysuria, urgency, frequency of urination, hematuria Neuro: Stroke, seizures, syncope Psych: Depression, anxiety, hallucinations   Physical Exam: Blood pressure 103/74, pulse 77, temperature 97.8 F (36.6 C), temperature source Oral, resp. rate 20, height 5\' 8"  (1.727 m), weight 95.255 kg (210 lb), SpO2 95.00%. Constitutional:   Patient is a well-developed and well-nourished *[name], confused, in no acute distress and cooperative with exam. Head: Normocephalic and atraumatic Mouth: Mucus membranes moist Eyes: PERRL, EOMI, conjunctivae normal Neck: Supple, No Thyromegaly Cardiovascular: RRR, S1 normal, S2 normal Pulmonary/Chest: CTAB, no wheezes, rales, or rhonchi Abdominal: Soft. Non-tender, non-distended, bowel sounds are normal, no masses, organomegaly, or guarding present.  Neurological: A&O x3, intermittent confusion, Strenght is normal and symmetric bilaterally, cranial nerve II-XII are grossly intact, no focal motor deficit, sensory intact to light touch bilaterally.  Extremities : No Cyanosis, Clubbing or Edema  Labs on Admission:  Basic Metabolic Panel:  Recent Labs Lab 03/02/14 1530  NA 141  K 3.3*  CL 101  CO2 22  GLUCOSE 175*  BUN 20  CREATININE 1.38*  CALCIUM 9.7   Liver Function Tests:  Recent Labs Lab 03/02/14 1530  AST 33  ALT 16  ALKPHOS 75  BILITOT 0.5  PROT 8.4*  ALBUMIN 4.0   No results found for this basename: LIPASE, AMYLASE,  in the last 168  hours No results found for this basename: AMMONIA,  in the last 168 hours CBC:  Recent Labs Lab 03/02/14 1530  WBC 10.5  NEUTROABS 8.3*  HGB 13.2  HCT 39.5  MCV 83.2  PLT 279   Cardiac Enzymes:  Recent Labs Lab 03/02/14 1530  TROPONINI <0.30    BNP (last 3 results) No results found for this basename: PROBNP,  in the last 8760 hours CBG: No results found for this basename: GLUCAP,  in the last 168 hours  Radiological Exams on Admission: Ct Head Wo Contrast  03/02/2014   CLINICAL DATA:  Altered mental status. Hallucinations. No known injury.  EXAM: CT HEAD WITHOUT CONTRAST  TECHNIQUE: Contiguous axial images were obtained from the base of the skull through the vertex without intravenous contrast.  COMPARISON:  None.  FINDINGS: There is no evidence of acute intracranial hemorrhage, mass lesion, brain edema or extra-axial fluid collection. The ventricles and subarachnoid spaces are appropriately sized for age. There is no  CT evidence of acute cortical infarction. Intracranial vascular calcifications are noted.  The visualized paranasal sinuses, mastoid air cells and middle ears are clear. The calvarium is intact.  IMPRESSION: Unremarkable noncontrast head CT.   Electronically Signed   By: Roxy HorsemanBill  Veazey M.D.   On: 03/02/2014 18:15   Dg Chest Port 1 View  03/02/2014   CLINICAL DATA:  Altered mental status.  EXAM: PORTABLE CHEST - 1 VIEW  COMPARISON:  02/23/2011.  FINDINGS: The cardiac silhouette, mediastinal and hilar contours are within normal limits and stable. Right infrahilar scarring changes are noted. No infiltrates, edema or effusions. The bony thorax is intact.  IMPRESSION: No acute cardiopulmonary findings.   Electronically Signed   By: Loralie ChampagneMark  Gallerani M.D.   On: 03/02/2014 17:46    EKG: Independently reviewed. Normal sinus rhythm, nonspecific T wave abnormalities   Assessment/Plan Active Problems:   Encephalopathy   Altered mental status   Sarcoidosis   Chronic back  pain  Altered mental status Likely due to polypharmacy, patient has been taking 2 tablets of Lyrica possibly 3 times a day as per patient's husband. Will hold Lyrica at this time, will obtain MRI brain in a.m. to rule out stroke. Patient does not have any neurological deficit.  Chest pain EKG shows nonspecific T wave abnormalities, we'll cycle cardiac enzymes.  Chronic back pain Continue fentanyl patch  Dehydration Creatinine is 1.38, her baseline creatinine is 0.62. We'll start IV normal saline at 75 mL per hour  Hypokalemia Replace potassium  Diabetes mellitus Hold Glucotrol, start sliding scale insulin with NovoLog.  Sarcoidosis Continue hydroxychloroquine   DVT prophylaxis Lovenox  Code status: Patient is full code  Family discussion: Admission, patients condition and plan of care including tests being ordered have been discussed with the patient and her husband at bedside** who indicate understanding and agree with the plan and Code Status.   Time Spent on Admission: 60 minutes  Malka Bocek S [facility] Pager: [phone] 03/02/2014, 7:50 PM  If 7PM-7AM, please contact night-coverage  [email or web address]  Password [email or web address]

## 2014-03-02 NOTE — ED Provider Notes (Signed)
CSN: [id number]     Arrival date & time 03/02/14  1450 History   First MD Initiated Contact with Patient 03/02/14 1657     Chief Complaint  Patient presents with  . Altered Mental Status     (Consider location/radiation/quality/duration/timing/severity/associated sxs/prior Treatment) Patient is a 68 y.o. female presenting with altered mental status. The history is provided by the patient and the spouse.  Altered Mental Status  level 5 caveat due to altered mental status.  Patient presents with altered mental status. She is reportedly had some confusion and some hallucinations. Husband states that she has been hearing the following and talking some but abdomen. She is confused during my examination. Began around 3 days ago. She reportedly has had a headache also. Husband states that when she had to go on dialysis in the past she had some confusion like this. She has chronic pain and does not get out of bed very much.  Past Medical History  Diagnosis Date  . Coronary artery disease   . Diabetes mellitus without complication   . Arthritis   . Sarcoidosis   . Renal disorder   . Osteoporosis    Past Surgical History  Procedure Laterality Date  . Fracture surgery    . Back surgery     History reviewed. No pertinent family history. History  Substance Use Topics  . Smoking status: Never Smoker   . Smokeless tobacco: Not on file  . Alcohol Use: No   OB History   Grav Para Term Preterm Abortions TAB SAB Ect Mult Living                 Review of Systems  Unable to perform ROS     Allergies  Darvon; Morphine and related; and Penicillins  Home Medications   Prior to Admission medications   Medication Sig Start Date End Date Taking? Authorizing Provider  alendronate (FOSAMAX) 70 MG tablet Take 70 mg by mouth once a week. Take with a full glass of water on an empty stomach.   Yes Historical Provider, MD  amLODipine (NORVASC) 5 MG tablet Take 5 mg by mouth daily.   Yes  Historical Provider, MD  Cyanocobalamin (B-12 PO) Take 1 tablet by mouth daily.   Yes Historical Provider, MD  darifenacin (ENABLEX) 15 MG 24 hr tablet Take 15 mg by mouth daily.   Yes Historical Provider, MD  escitalopram (LEXAPRO) 20 MG tablet Take 20 mg by mouth at bedtime.    Yes Historical Provider, MD  fentaNYL (DURAGESIC - DOSED MCG/HR) 50 MCG/HR Place 50 mcg onto the skin every 3 (three) days.   Yes Historical Provider, MD  furosemide (LASIX) 40 MG tablet Take 40 mg by mouth 2 (two) times daily.   Yes Historical Provider, MD  glipiZIDE (GLUCOTROL XL) 5 MG 24 hr tablet Take 5 mg by mouth daily with breakfast.   Yes Historical Provider, MD  hydroxychloroquine (PLAQUENIL) 200 MG tablet Take 200 mg by mouth daily.   Yes Historical Provider, MD  lisinopril (PRINIVIL,ZESTRIL) 2.5 MG tablet Take 2.5 mg by mouth daily.   Yes Historical Provider, MD  methocarbamol (ROBAXIN) 500 MG tablet Take 500 mg by mouth every 6 (six) hours as needed for muscle spasms.   Yes Historical Provider, MD  Multiple Vitamin (MULTIVITAMIN WITH MINERALS) TABS tablet Take 1 tablet by mouth daily.   Yes Historical Provider, MD  oxycodone (OXY-IR) 5 MG capsule Take 10 mg by mouth 4 (four) times daily as needed for pain.  Yes Historical Provider, MD  pantoprazole (PROTONIX) 40 MG tablet Take 40 mg by mouth 2 (two) times daily.   Yes Historical Provider, MD  potassium chloride SA (K-DUR,KLOR-CON) 20 MEQ tablet Take 20 mEq by mouth 2 (two) times daily.   Yes Historical Provider, MD  pregabalin (LYRICA) 50 MG capsule Take 50 mg by mouth 3 (three) times daily.   Yes Historical Provider, MD  Pyridoxine HCl (B-6 PO) Take 1 tablet by mouth daily.   Yes Historical Provider, MD  simvastatin (ZOCOR) 40 MG tablet Take 40 mg by mouth at bedtime.   Yes Historical Provider, MD  sodium bicarbonate 650 MG tablet Take 650 mg by mouth 2 (two) times daily.   Yes Historical Provider, MD  solifenacin (VESICARE) 5 MG tablet Take 5 mg by mouth  daily.   Yes Historical Provider, MD  Vitamin D, Ergocalciferol, (DRISDOL) 50000 UNITS CAPS capsule Take 50,000 Units by mouth every 7 (seven) days. *take once weekly on the same day each week*   Yes Historical Provider, MD   BP 150/81  Pulse 68  Temp(Src) 98.2 F (36.8 C) (Oral)  Resp 20  Ht 5\' 8"  (1.727 m)  Wt 212 lb 3.2 oz (96.253 kg)  BMI 32.27 kg/m2  SpO2 94% Physical Exam  Constitutional: She appears well-developed and well-nourished.  HENT:  Head: Normocephalic and atraumatic.  Eyes: Pupils are equal, round, and reactive to light.  Neck: Neck supple.  Cardiovascular: Normal rate and regular rhythm.   Pulmonary/Chest: Effort normal and breath sounds normal.  Abdominal: Soft. There is no tenderness.  Musculoskeletal: She exhibits tenderness.  Tenderness to her left knee, chronic per patient. Somewhat decreased range of motion due to pain.  Neurological: She is alert.  Patient is alert but confused. She is able follow commands. She will recognize her husband but has difficulty answering most questions. She is alert to self  Skin: Skin is warm.    ED Course  Procedures (including critical care time) Labs Review Labs Reviewed  CBC WITH DIFFERENTIAL - Abnormal; Notable for the following:    Neutrophils Relative % 79 (*)    Neutro Abs 8.3 (*)    All other components within normal limits  COMPREHENSIVE METABOLIC PANEL - Abnormal; Notable for the following:    Potassium 3.3 (*)    Glucose, Bld 175 (*)    Creatinine, Ser 1.38 (*)    Total Protein 8.4 (*)    GFR calc non Af Amer 39 (*)    GFR calc Af Amer 45 (*)    Anion gap 18 (*)    All other components within normal limits  URINALYSIS, ROUTINE W REFLEX MICROSCOPIC - Abnormal; Notable for the following:    Hgb urine dipstick MODERATE (*)    Bilirubin Urine SMALL (*)    Ketones, ur 15 (*)    Protein, ur 100 (*)    All other components within normal limits  TROPONIN I  URINE MICROSCOPIC-ADD ON  TROPONIN I  TROPONIN I   TROPONIN I  CBC  COMPREHENSIVE METABOLIC PANEL    Imaging Review Ct Head Wo Contrast  03/02/2014   CLINICAL DATA:  Altered mental status. Hallucinations. No known injury.  EXAM: CT HEAD WITHOUT CONTRAST  TECHNIQUE: Contiguous axial images were obtained from the base of the skull through the vertex without intravenous contrast.  COMPARISON:  None.  FINDINGS: There is no evidence of acute intracranial hemorrhage, mass lesion, brain edema or extra-axial fluid collection. The ventricles and subarachnoid spaces are appropriately sized  for age. There is no CT evidence of acute cortical infarction. Intracranial vascular calcifications are noted.  The visualized paranasal sinuses, mastoid air cells and middle ears are clear. The calvarium is intact.  IMPRESSION: Unremarkable noncontrast head CT.   Electronically Signed   By: Roxy HorsemanBill  Veazey M.D.   On: 03/02/2014 18:15   Dg Chest Port 1 View  03/02/2014   CLINICAL DATA:  Altered mental status.  EXAM: PORTABLE CHEST - 1 VIEW  COMPARISON:  02/23/2011.  FINDINGS: The cardiac silhouette, mediastinal and hilar contours are within normal limits and stable. Right infrahilar scarring changes are noted. No infiltrates, edema or effusions. The bony thorax is intact.  IMPRESSION: No acute cardiopulmonary findings.   Electronically Signed   By: Loralie ChampagneMark  Gallerani M.D.   On: 03/02/2014 17:46     EKG Interpretation   Date/Time:  Tuesday March 02 2014 17:30:37 EDT Ventricular Rate:  68 PR Interval:  155 QRS Duration: 96 QT Interval:  540 QTC Calculation: 574 R Axis:   64 Text Interpretation:  Sinus rhythm Abnrm T, consider ischemia,  anterolateral lds Prolonged QT interval St and T wave changes andteriorly  and posteriorly Confirmed by Rubin PayorPICKERING  MD, Harrold DonathNATHAN [id number]) on 03/02/2014  5:33:21 PM      MDM   Final diagnoses:  Encephalopathy    Patient with altered mental status. His been going for a few days. No infectious cause. She is on several different medications  which could cause this. Head CT reassuring. Will admit to internal medicine. Does not appear to be an acute stroke eligible for TPA    Criag Wicklund R. Rubin PayorPickering, MD 03/02/14 2153

## 2014-03-02 NOTE — Progress Notes (Signed)
Nurse in room assessing pt at this time. Pt is alert and oriented to self, place, time and situation. But, pt talks about random topics. Goes from one topic to another. Having conversation with self that doesn't make sense. Pt confused with reality at this time. Bed alarm is "on". Will continue to monitor pt frequently throughout night.

## 2014-03-02 NOTE — ED Notes (Signed)
Husband reports that pt has been having hallucination. .  Pt says they are dreams, but husband says  She cannot distinguish from reality.     Alert, talking, says month is august , knows year  And her age.  Also pain lt leg.

## 2014-03-03 LAB — GLUCOSE, CAPILLARY
Glucose-Capillary: 121 mg/dL — ABNORMAL HIGH (ref 70–99)
Glucose-Capillary: 132 mg/dL — ABNORMAL HIGH (ref 70–99)
Glucose-Capillary: 124 mg/dL — ABNORMAL HIGH (ref 70–99)
Glucose-Capillary: 125 mg/dL — ABNORMAL HIGH (ref 70–99)

## 2014-03-03 LAB — COMPREHENSIVE METABOLIC PANEL
ALT: 14 U/L (ref 0–35)
AST: 28 U/L (ref 0–37)
Albumin: 3.7 g/dL (ref 3.5–5.2)
Alkaline Phosphatase: 67 U/L (ref 39–117)
Anion gap: 15 (ref 5–15)
BUN: 23 mg/dL (ref 6–23)
CO2: 26 mEq/L (ref 19–32)
Calcium: 9.6 mg/dL (ref 8.4–10.5)
Chloride: 100 mEq/L (ref 96–112)
Creatinine, Ser: 1.38 mg/dL — ABNORMAL HIGH (ref 0.50–1.10)
GFR calc Af Amer: 45 mL/min — ABNORMAL LOW (ref >90)
GFR calc non Af Amer: 39 mL/min — ABNORMAL LOW (ref >90)
Glucose, Bld: 119 mg/dL — ABNORMAL HIGH (ref 70–99)
Potassium: 3.1 mEq/L — ABNORMAL LOW (ref 3.7–5.3)
Sodium: 141 mEq/L (ref 137–147)
Total Bilirubin: 0.4 mg/dL (ref 0.3–1.2)
Total Protein: 7.7 g/dL (ref 6.0–8.3)

## 2014-03-03 LAB — CBC
HCT: 37.2 % (ref 36.0–46.0)
Hemoglobin: 12.2 g/dL (ref 12.0–15.0)
MCH: 27.4 pg (ref 26.0–34.0)
MCHC: 32.8 g/dL (ref 30.0–36.0)
MCV: 83.4 fL (ref 78.0–100.0)
Platelets: 230 10*3/uL (ref 150–400)
RBC: 4.46 MIL/uL (ref 3.87–5.11)
RDW: 15.5 % (ref 11.5–15.5)
WBC: 9.6 10*3/uL (ref 4.0–10.5)

## 2014-03-03 LAB — TROPONIN I
Troponin I: <0.3 ng/mL (ref <0.30)
Troponin I: <0.3 ng/mL (ref <0.30)

## 2014-03-03 LAB — SEDIMENTATION RATE: Sed Rate: 13 mm/hr (ref 0–22)

## 2014-03-03 MED ORDER — POTASSIUM CHLORIDE IN NACL 20-0.9 MEQ/L-% IV SOLN
INTRAVENOUS | Status: DC
  Administered 2014-03-04 – 2014-03-05 (×2): via INTRAVENOUS

## 2014-03-03 MED ORDER — SODIUM CHLORIDE 0.9 % IV SOLN
INTRAVENOUS | Status: DC
  Administered 2014-03-03: 06:00:00 via INTRAVENOUS
  Filled 2014-03-03 (×3): qty 1000

## 2014-03-03 MED ORDER — POTASSIUM CHLORIDE CRYS ER 20 MEQ PO TBCR
40.0000 meq | EXTENDED_RELEASE_TABLET | Freq: Once | ORAL | Status: AC
  Administered 2014-03-03: 40 meq via ORAL
  Filled 2014-03-03: qty 2

## 2014-03-03 MED ORDER — OXYCODONE HCL 5 MG PO TABS
10.0000 mg | ORAL_TABLET | ORAL | Status: DC | PRN
  Administered 2014-03-03 – 2014-03-04 (×2): 10 mg via ORAL
  Filled 2014-03-03 (×2): qty 2

## 2014-03-03 MED ORDER — POTASSIUM CHLORIDE 2 MEQ/ML IV SOLN
INTRAVENOUS | Status: DC
  Filled 2014-03-03: qty 1000

## 2014-03-03 NOTE — Progress Notes (Signed)
Subjective: She was admitted with encephalopathy and hallucinations. Her husband says she's been having trouble for about 3 days. He discovered that she was taking too Lyrica at a time instead of one which is how it's described. She has been on large doses of narcotics since she had a severe fracture in her ankle about 4 years ago. I have recently started reducing the amount of pain medication in a slow taper and she has tolerated that pretty well. Her husband says that she has not had any more than usual of her pain medication. She's not had any other symptoms such as fever chills seizures etc.  Objective: Vital signs in last 24 hours: Temp:  [97.8 F (36.6 C)-98.2 F (36.8 C)] 97.9 F (36.6 C) (07/08 0420) Pulse Rate:  [63-83] 83 (07/08 0420) Resp:  [20] 20 (07/07 2125) BP: (97-150)/(55-94) 126/77 mmHg (07/08 0420) SpO2:  [94 %-97 %] 96 % (07/08 0420) Weight:  [95.255 kg (210 lb)-96.253 kg (212 lb 3.2 oz)] 96.253 kg (212 lb 3.2 oz) (07/07 2125) Weight change:  Last BM Date: 02/27/14  Intake/Output from previous day:    PHYSICAL EXAM General appearance: alert, cooperative, mild distress and Confused Resp: clear to auscultation bilaterally Cardio: regular rate and rhythm, S1, S2 normal, no murmur, click, rub or gallop GI: soft, non-tender; bowel sounds normal; no masses,  no organomegaly Extremities: extremities normal, atraumatic, no cyanosis or edema  Lab Results:  Results for orders placed during the hospital encounter of 03/02/14 (from the past 48 hour(s))  CBC WITH DIFFERENTIAL     Status: Abnormal   Collection Time    03/02/14  3:30 PM      Result Value Ref Range   WBC 10.5  4.0 - 10.5 K/uL   Comment: WHITE COUNT CONFIRMED ON SMEAR   RBC 4.75  3.87 - 5.11 MIL/uL   Hemoglobin 13.2  12.0 - 15.0 g/dL   HCT 39.5  36.0 - 46.0 %   MCV 83.2  78.0 - 100.0 fL   MCH 27.8  26.0 - 34.0 pg   MCHC 33.4  30.0 - 36.0 g/dL   RDW 15.2  11.5 - 15.5 %   Platelets 279  150 - 400 K/uL   Neutrophils Relative % 79 (*) 43 - 77 %   Lymphocytes Relative 15  12 - 46 %   Monocytes Relative 6  3 - 12 %   Eosinophils Relative 0  0 - 5 %   Basophils Relative 0  0 - 1 %   Neutro Abs 8.3 (*) 1.7 - 7.7 K/uL   Lymphs Abs 1.6  0.7 - 4.0 K/uL   Monocytes Absolute 0.6  0.1 - 1.0 K/uL   Eosinophils Absolute 0.0  0.0 - 0.7 K/uL   Basophils Absolute 0.0  0.0 - 0.1 K/uL  COMPREHENSIVE METABOLIC PANEL     Status: Abnormal   Collection Time    03/02/14  3:30 PM      Result Value Ref Range   Sodium 141  137 - 147 mEq/L   Potassium 3.3 (*) 3.7 - 5.3 mEq/L   Chloride 101  96 - 112 mEq/L   CO2 22  19 - 32 mEq/L   Glucose, Bld 175 (*) 70 - 99 mg/dL   BUN 20  6 - 23 mg/dL   Creatinine, Ser 1.38 (*) 0.50 - 1.10 mg/dL   Calcium 9.7  8.4 - 10.5 mg/dL   Total Protein 8.4 (*) 6.0 - 8.3 g/dL   Albumin 4.0  3.5 -  5.2 g/dL   AST 33  0 - 37 U/L   ALT 16  0 - 35 U/L   Alkaline Phosphatase 75  39 - 117 U/L   Total Bilirubin 0.5  0.3 - 1.2 mg/dL   GFR calc non Af Amer 39 (*) >90 mL/min   GFR calc Af Amer 45 (*) >90 mL/min   Comment: (NOTE)     The eGFR has been calculated using the CKD EPI equation.     This calculation has not been validated in all clinical situations.     eGFR's persistently <90 mL/min signify possible Chronic Kidney     Disease.   Anion gap 18 (*) 5 - 15  TROPONIN I     Status: None   Collection Time    03/02/14  3:30 PM      Result Value Ref Range   Troponin I <0.30  <0.30 ng/mL   Comment:            Due to the release kinetics of cTnI,     a negative result within the first hours     of the onset of symptoms does not rule out     myocardial infarction with certainty.     If myocardial infarction is still suspected,     repeat the test at appropriate intervals.  URINALYSIS, ROUTINE W REFLEX MICROSCOPIC     Status: Abnormal   Collection Time    03/02/14  5:25 PM      Result Value Ref Range   Color, Urine YELLOW  YELLOW   APPearance CLEAR  CLEAR   Specific Gravity,  Urine 1.020  1.005 - 1.030   pH 6.0  5.0 - 8.0   Glucose, UA NEGATIVE  NEGATIVE mg/dL   Hgb urine dipstick MODERATE (*) NEGATIVE   Bilirubin Urine SMALL (*) NEGATIVE   Ketones, ur 15 (*) NEGATIVE mg/dL   Protein, ur 100 (*) NEGATIVE mg/dL   Urobilinogen, UA 0.2  0.0 - 1.0 mg/dL   Nitrite NEGATIVE  NEGATIVE   Leukocytes, UA NEGATIVE  NEGATIVE  URINE MICROSCOPIC-ADD ON     Status: None   Collection Time    03/02/14  5:25 PM      Result Value Ref Range   Squamous Epithelial / LPF RARE  RARE   WBC, UA 0-2  <3 WBC/hpf   RBC / HPF 7-10  <3 RBC/hpf   Bacteria, UA RARE  RARE  TROPONIN I     Status: None   Collection Time    03/02/14  8:49 PM      Result Value Ref Range   Troponin I <0.30  <0.30 ng/mL   Comment:            Due to the release kinetics of cTnI,     a negative result within the first hours     of the onset of symptoms does not rule out     myocardial infarction with certainty.     If myocardial infarction is still suspected,     repeat the test at appropriate intervals.  GLUCOSE, CAPILLARY     Status: Abnormal   Collection Time    03/02/14 10:21 PM      Result Value Ref Range   Glucose-Capillary 121 (*) 70 - 99 mg/dL   Comment 1 Notify RN    TROPONIN I     Status: None   Collection Time    03/03/14  2:22 AM      Result Value  Ref Range   Troponin I <0.30  <0.30 ng/mL   Comment:            Due to the release kinetics of cTnI,     a negative result within the first hours     of the onset of symptoms does not rule out     myocardial infarction with certainty.     If myocardial infarction is still suspected,     repeat the test at appropriate intervals.  CBC     Status: None   Collection Time    03/03/14  2:22 AM      Result Value Ref Range   WBC 9.6  4.0 - 10.5 K/uL   RBC 4.46  3.87 - 5.11 MIL/uL   Hemoglobin 12.2  12.0 - 15.0 g/dL   HCT 37.2  36.0 - 46.0 %   MCV 83.4  78.0 - 100.0 fL   MCH 27.4  26.0 - 34.0 pg   MCHC 32.8  30.0 - 36.0 g/dL   RDW 15.5  11.5  - 15.5 %   Platelets 230  150 - 400 K/uL  COMPREHENSIVE METABOLIC PANEL     Status: Abnormal   Collection Time    03/03/14  2:22 AM      Result Value Ref Range   Sodium 141  137 - 147 mEq/L   Potassium 3.1 (*) 3.7 - 5.3 mEq/L   Chloride 100  96 - 112 mEq/L   CO2 26  19 - 32 mEq/L   Glucose, Bld 119 (*) 70 - 99 mg/dL   BUN 23  6 - 23 mg/dL   Creatinine, Ser 1.38 (*) 0.50 - 1.10 mg/dL   Calcium 9.6  8.4 - 10.5 mg/dL   Total Protein 7.7  6.0 - 8.3 g/dL   Albumin 3.7  3.5 - 5.2 g/dL   AST 28  0 - 37 U/L   ALT 14  0 - 35 U/L   Alkaline Phosphatase 67  39 - 117 U/L   Total Bilirubin 0.4  0.3 - 1.2 mg/dL   GFR calc non Af Amer 39 (*) >90 mL/min   GFR calc Af Amer 45 (*) >90 mL/min   Comment: (NOTE)     The eGFR has been calculated using the CKD EPI equation.     This calculation has not been validated in all clinical situations.     eGFR's persistently <90 mL/min signify possible Chronic Kidney     Disease.   Anion gap 15  5 - 15  GLUCOSE, CAPILLARY     Status: Abnormal   Collection Time    03/03/14  7:47 AM      Result Value Ref Range   Glucose-Capillary 121 (*) 70 - 99 mg/dL   Comment 1 Notify RN    TROPONIN I     Status: None   Collection Time    03/03/14  8:07 AM      Result Value Ref Range   Troponin I <0.30  <0.30 ng/mL   Comment:            Due to the release kinetics of cTnI,     a negative result within the first hours     of the onset of symptoms does not rule out     myocardial infarction with certainty.     If myocardial infarction is still suspected,     repeat the test at appropriate intervals.    ABGS No results found for this basename: PHART,  PCO2, PO2ART, TCO2, HCO3,  in the last 72 hours CULTURES No results found for this or any previous visit (from the past 240 hour(s)). Studies/Results: Ct Head Wo Contrast  03/02/2014   CLINICAL DATA:  Altered mental status. Hallucinations. No known injury.  EXAM: CT HEAD WITHOUT CONTRAST  TECHNIQUE: Contiguous axial  images were obtained from the base of the skull through the vertex without intravenous contrast.  COMPARISON:  None.  FINDINGS: There is no evidence of acute intracranial hemorrhage, mass lesion, brain edema or extra-axial fluid collection. The ventricles and subarachnoid spaces are appropriately sized for age. There is no CT evidence of acute cortical infarction. Intracranial vascular calcifications are noted.  The visualized paranasal sinuses, mastoid air cells and middle ears are clear. The calvarium is intact.  IMPRESSION: Unremarkable noncontrast head CT.   Electronically Signed   By: Camie Patience M.D.   On: 03/02/2014 18:15   Mr Brain Wo Contrast  03/02/2014   CLINICAL DATA:  Altered mental status.  Evaluate for stroke.  EXAM: MRI HEAD WITHOUT CONTRAST  TECHNIQUE: Multiplanar, multiecho pulse sequences of the brain and surrounding structures were obtained without intravenous contrast.  COMPARISON:  Prior CT performed earlier on the same day.  FINDINGS: Study is degraded by motion artifact.  Mild diffuse prominence of the CSF containing spaces is compatible with generalized cerebral atrophy. Few scattered subcentimeter T2/FLAIR hyperintensity seen within the periventricular white matter, nonspecific, but likely related to minimal small vessel changes.  No mass lesion, midline shift, or extra-axial fluid collection. Ventricles are normal in size without evidence of hydrocephalus.  No diffusion-weighted signal abnormality is identified to suggest acute intracranial infarct. Gray-white matter differentiation is maintained. Normal flow voids are seen within the intracranial vasculature. No intracranial hemorrhage identified.  The cervicomedullary junction is normal. Pituitary gland is within normal limits. Pituitary stalk is midline. No acute abnormality seen about the globes and optic nerves.  The bone marrow signal intensity is normal. Calvarium is intact. Degenerative changes noted within the partially  visualized upper cervical spine.  Scalp soft tissues are unremarkable.  Paranasal sinuses are clear. Scattered fluid signal intensity noted within the right mastoid air cells inferiorly.  IMPRESSION: 1. No acute intracranial infarct or other abnormality identified. 2. Mild age-related cerebral atrophy.   Electronically Signed   By: Jeannine Boga M.D.   On: 03/02/2014 22:46   Dg Chest Port 1 View  03/02/2014   CLINICAL DATA:  Altered mental status.  EXAM: PORTABLE CHEST - 1 VIEW  COMPARISON:  02/23/2011.  FINDINGS: The cardiac silhouette, mediastinal and hilar contours are within normal limits and stable. Right infrahilar scarring changes are noted. No infiltrates, edema or effusions. The bony thorax is intact.  IMPRESSION: No acute cardiopulmonary findings.   Electronically Signed   By: Kalman Jewels M.D.   On: 03/02/2014 17:46    Medications:  Prior to Admission:  Prescriptions prior to admission  Medication Sig Dispense Refill  . alendronate (FOSAMAX) 70 MG tablet Take 70 mg by mouth once a week. Take with a full glass of water on an empty stomach.      Marland Kitchen amLODipine (NORVASC) 5 MG tablet Take 5 mg by mouth daily.      . Cyanocobalamin (B-12 PO) Take 1 tablet by mouth daily.      Marland Kitchen darifenacin (ENABLEX) 15 MG 24 hr tablet Take 15 mg by mouth daily.      Marland Kitchen escitalopram (LEXAPRO) 20 MG tablet Take 20 mg by mouth at bedtime.       Marland Kitchen  fentaNYL (DURAGESIC - DOSED MCG/HR) 50 MCG/HR Place 50 mcg onto the skin every 3 (three) days.      . furosemide (LASIX) 40 MG tablet Take 40 mg by mouth 2 (two) times daily.      Marland Kitchen glipiZIDE (GLUCOTROL XL) 5 MG 24 hr tablet Take 5 mg by mouth daily with breakfast.      . hydroxychloroquine (PLAQUENIL) 200 MG tablet Take 200 mg by mouth daily.      Marland Kitchen lisinopril (PRINIVIL,ZESTRIL) 2.5 MG tablet Take 2.5 mg by mouth daily.      . methocarbamol (ROBAXIN) 500 MG tablet Take 500 mg by mouth every 6 (six) hours as needed for muscle spasms.      . Multiple Vitamin  (MULTIVITAMIN WITH MINERALS) TABS tablet Take 1 tablet by mouth daily.      Marland Kitchen oxycodone (OXY-IR) 5 MG capsule Take 10 mg by mouth 4 (four) times daily as needed for pain.      . pantoprazole (PROTONIX) 40 MG tablet Take 40 mg by mouth 2 (two) times daily.      . potassium chloride SA (K-DUR,KLOR-CON) 20 MEQ tablet Take 20 mEq by mouth 2 (two) times daily.      . pregabalin (LYRICA) 50 MG capsule Take 50 mg by mouth 3 (three) times daily.      . Pyridoxine HCl (B-6 PO) Take 1 tablet by mouth daily.      . simvastatin (ZOCOR) 40 MG tablet Take 40 mg by mouth at bedtime.      . sodium bicarbonate 650 MG tablet Take 650 mg by mouth 2 (two) times daily.      . solifenacin (VESICARE) 5 MG tablet Take 5 mg by mouth daily.      . Vitamin D, Ergocalciferol, (DRISDOL) 50000 UNITS CAPS capsule Take 50,000 Units by mouth every 7 (seven) days. *take once weekly on the same day each week*       Scheduled: . amLODipine  5 mg Oral Daily  . enoxaparin (LOVENOX) injection  40 mg Subcutaneous Q24H  . escitalopram  20 mg Oral QHS  . fentaNYL  50 mcg Transdermal Q72H  . hydroxychloroquine  200 mg Oral Daily  . insulin aspart  0-9 Units Subcutaneous TID WC  . lisinopril  2.5 mg Oral Daily  . pantoprazole  40 mg Oral BID  . potassium chloride  40 mEq Oral Once  . simvastatin  40 mg Oral QHS  . sodium chloride  3 mL Intravenous Q12H   Continuous: . sodium chloride 0.9 % 1,000 mL with potassium chloride 20 mEq infusion 75 mL/hr at 03/03/14 0543   DJT:[id number], acetaminophen, ondansetron (ZOFRAN) IV, ondansetron, oxyCODONE  Assesment: She was admitted with encephalopathy. I think it's probably from taking too much Lyrica. She has sarcoidosis. She's had trouble with diabetes. She has chronic pain Active Problems:   Encephalopathy   Altered mental status   Sarcoidosis   Chronic back pain    Plan: She will stay off Lyrica. Minimize her other medications. Neurology consultation will be requested     LOS: 1 day   Halden Phegley L 03/03/2014, 9:04 AM

## 2014-03-03 NOTE — Consult Note (Signed)
[place] A. Merlene Laughter, MD     [email or web address]          [name] is an 68 y.o. female.   ASSESSMENT/PLAN: 1. Toxic metabolic encephalopathy likely multifactorial. The prime suspect is multiple psychotropic medications particularly overuse of Lyrica. Additionally, review of her creatinine function indicates that it has been declining which undoubtedly contributes to low clearance of medications. Her creatinine was 0.6 but has more than doubled Over the years. My recommendation is that we should discontinue the Lyrica which has been done. I will also discontinue the fentanyl patch for now. An EEG will be obtained. Additional labs will be obtained including thyroid function tests and vitamin B12 level. We'll follow the patient's neurological status.  This is a 68 year old black female who is on several medications for chronic pain. It appears that there has been some reduction in her pain medications recently but she was restarted on Lyrica about 2 months ago. She was supposed to be taking one 3 times a day. It appears that she has been taking twice a dose 2 tablets 3 times a day per the husband. She has been having significant hallucinations, disorientation and confusion over the past 3 days which led her to be taken to the hospital. No fevers, headaches or other neurological symptoms are reported from the husband. The patient is unable to provide history as she is overly confused and disoriented. The patient's labs and imaging including MRI has been unremarkable. Her creatinine was 0.6 done several times 3 years ago but is now more than doubled.  GENERAL: Patient is agitated and irritated but in no acute distress. He is restless.  HEENT: Supple. Atraumatic normocephalic.   ABDOMEN: soft  EXTREMITIES:Puffy nonpitting edema of the legs. Marked arthritic changes of the knees.  BACK: Normal.  SKIN: Normal by inspection.    MENTAL STATUS: She overtly confused talking  nonstop in nonsensical manner. She is completely disoriented.  CRANIAL NERVES: Pupils are equal, round and reactive to light and accommodation; extra ocular movements are full, there is no significant nystagmus; visual fields are full; upper and lower facial muscles are normal in strength and symmetric, there is no flattening of the nasolabial folds; tongue is midline; uvula is midline; shoulder elevation is normal.  MOTOR: Normal tone, bulk and strength; no pronator drift.  COORDINATION: Left finger to nose is normal, right finger to nose is normal, No rest tremor; no intention tremor; no postural tremor; no bradykinesia.  REFLEXES: Deep tendon reflexes are symmetrical and normal. Babinski reflexes are flexor bilaterally.   SENSATION: Responses to deep painful stimuli.      Past Medical History  Diagnosis Date  . Coronary artery disease   . Diabetes mellitus without complication   . Arthritis   . Sarcoidosis   . Renal disorder   . Osteoporosis     Past Surgical History  Procedure Laterality Date  . Fracture surgery    . Back surgery      History reviewed. No pertinent family history.  Social History:  reports that she has never smoked. She does not have any smokeless tobacco history on file. She reports that she does not drink alcohol or use illicit drugs.  Allergies:  Allergies  Allergen Reactions  . Darvon [Propoxyphene] Nausea And Vomiting  . Morphine And Related Itching and Rash  . Penicillins Itching and Rash    Medications: Prior to Admission medications   Medication Sig Start Date End Date Taking? Authorizing Provider  alendronate (FOSAMAX) 70  MG tablet Take 70 mg by mouth once a week. Take with a full glass of water on an empty stomach.   Yes Historical Provider, MD  amLODipine (NORVASC) 5 MG tablet Take 5 mg by mouth daily.   Yes Historical Provider, MD  Cyanocobalamin (B-12 PO) Take 1 tablet by mouth daily.   Yes Historical Provider, MD  darifenacin  (ENABLEX) 15 MG 24 hr tablet Take 15 mg by mouth daily.   Yes Historical Provider, MD  escitalopram (LEXAPRO) 20 MG tablet Take 20 mg by mouth at bedtime.    Yes Historical Provider, MD  fentaNYL (DURAGESIC - DOSED MCG/HR) 50 MCG/HR Place 50 mcg onto the skin every 3 (three) days.   Yes Historical Provider, MD  furosemide (LASIX) 40 MG tablet Take 40 mg by mouth 2 (two) times daily.   Yes Historical Provider, MD  glipiZIDE (GLUCOTROL XL) 5 MG 24 hr tablet Take 5 mg by mouth daily with breakfast.   Yes Historical Provider, MD  hydroxychloroquine (PLAQUENIL) 200 MG tablet Take 200 mg by mouth daily.   Yes Historical Provider, MD  lisinopril (PRINIVIL,ZESTRIL) 2.5 MG tablet Take 2.5 mg by mouth daily.   Yes Historical Provider, MD  methocarbamol (ROBAXIN) 500 MG tablet Take 500 mg by mouth every 6 (six) hours as needed for muscle spasms.   Yes Historical Provider, MD  Multiple Vitamin (MULTIVITAMIN WITH MINERALS) TABS tablet Take 1 tablet by mouth daily.   Yes Historical Provider, MD  oxycodone (OXY-IR) 5 MG capsule Take 10 mg by mouth 4 (four) times daily as needed for pain.   Yes Historical Provider, MD  pantoprazole (PROTONIX) 40 MG tablet Take 40 mg by mouth 2 (two) times daily.   Yes Historical Provider, MD  potassium chloride SA (K-DUR,KLOR-CON) 20 MEQ tablet Take 20 mEq by mouth 2 (two) times daily.   Yes Historical Provider, MD  pregabalin (LYRICA) 50 MG capsule Take 50 mg by mouth 3 (three) times daily.   Yes Historical Provider, MD  Pyridoxine HCl (B-6 PO) Take 1 tablet by mouth daily.   Yes Historical Provider, MD  simvastatin (ZOCOR) 40 MG tablet Take 40 mg by mouth at bedtime.   Yes Historical Provider, MD  sodium bicarbonate 650 MG tablet Take 650 mg by mouth 2 (two) times daily.   Yes Historical Provider, MD  solifenacin (VESICARE) 5 MG tablet Take 5 mg by mouth daily.   Yes Historical Provider, MD  Vitamin D, Ergocalciferol, (DRISDOL) 50000 UNITS CAPS capsule Take 50,000 Units by mouth  every 7 (seven) days. *take once weekly on the same day each week*   Yes Historical Provider, MD    Scheduled Meds: . amLODipine  5 mg Oral Daily  . enoxaparin (LOVENOX) injection  40 mg Subcutaneous Q24H  . escitalopram  20 mg Oral QHS  . fentaNYL  50 mcg Transdermal Q72H  . hydroxychloroquine  200 mg Oral Daily  . insulin aspart  0-9 Units Subcutaneous TID WC  . lisinopril  2.5 mg Oral Daily  . pantoprazole  40 mg Oral BID  . simvastatin  40 mg Oral QHS  . sodium chloride  3 mL Intravenous Q12H   Continuous Infusions: . 0.9 % NaCl with KCl 20 mEq / L 75 mL/hr at 03/03/14 1708   PRN Meds:.acetaminophen, acetaminophen, ondansetron (ZOFRAN) IV, ondansetron, oxyCODONE   Blood pressure 144/96, pulse 99, temperature 98.6 F (37 C), temperature source Oral, resp. rate 20, height '5\' 8"'  (1.727 m), weight 96.253 kg (212 lb 3.2 oz), SpO2  98.00%.   Results for orders placed during the hospital encounter of 03/02/14 (from the past 48 hour(s))  CBC WITH DIFFERENTIAL     Status: Abnormal   Collection Time    03/02/14  3:30 PM      Result Value Ref Range   WBC 10.5  4.0 - 10.5 K/uL   Comment: WHITE COUNT CONFIRMED ON SMEAR   RBC 4.75  3.87 - 5.11 MIL/uL   Hemoglobin 13.2  12.0 - 15.0 g/dL   HCT 39.5  36.0 - 46.0 %   MCV 83.2  78.0 - 100.0 fL   MCH 27.8  26.0 - 34.0 pg   MCHC 33.4  30.0 - 36.0 g/dL   RDW 15.2  11.5 - 15.5 %   Platelets 279  150 - 400 K/uL   Neutrophils Relative % 79 (*) 43 - 77 %   Lymphocytes Relative 15  12 - 46 %   Monocytes Relative 6  3 - 12 %   Eosinophils Relative 0  0 - 5 %   Basophils Relative 0  0 - 1 %   Neutro Abs 8.3 (*) 1.7 - 7.7 K/uL   Lymphs Abs 1.6  0.7 - 4.0 K/uL   Monocytes Absolute 0.6  0.1 - 1.0 K/uL   Eosinophils Absolute 0.0  0.0 - 0.7 K/uL   Basophils Absolute 0.0  0.0 - 0.1 K/uL  COMPREHENSIVE METABOLIC PANEL     Status: Abnormal   Collection Time    03/02/14  3:30 PM      Result Value Ref Range   Sodium 141  137 - 147 mEq/L    Potassium 3.3 (*) 3.7 - 5.3 mEq/L   Chloride 101  96 - 112 mEq/L   CO2 22  19 - 32 mEq/L   Glucose, Bld 175 (*) 70 - 99 mg/dL   BUN 20  6 - 23 mg/dL   Creatinine, Ser 1.38 (*) 0.50 - 1.10 mg/dL   Calcium 9.7  8.4 - 10.5 mg/dL   Total Protein 8.4 (*) 6.0 - 8.3 g/dL   Albumin 4.0  3.5 - 5.2 g/dL   AST 33  0 - 37 U/L   ALT 16  0 - 35 U/L   Alkaline Phosphatase 75  39 - 117 U/L   Total Bilirubin 0.5  0.3 - 1.2 mg/dL   GFR calc non Af Amer 39 (*) >90 mL/min   GFR calc Af Amer 45 (*) >90 mL/min   Comment: (NOTE)     The eGFR has been calculated using the CKD EPI equation.     This calculation has not been validated in all clinical situations.     eGFR's persistently <90 mL/min signify possible Chronic Kidney     Disease.   Anion gap 18 (*) 5 - 15  TROPONIN I     Status: None   Collection Time    03/02/14  3:30 PM      Result Value Ref Range   Troponin I <0.30  <0.30 ng/mL   Comment:            Due to the release kinetics of cTnI,     a negative result within the first hours     of the onset of symptoms does not rule out     myocardial infarction with certainty.     If myocardial infarction is still suspected,     repeat the test at appropriate intervals.  URINALYSIS, ROUTINE W REFLEX MICROSCOPIC     Status:  Abnormal   Collection Time    03/02/14  5:25 PM      Result Value Ref Range   Color, Urine YELLOW  YELLOW   APPearance CLEAR  CLEAR   Specific Gravity, Urine 1.020  1.005 - 1.030   pH 6.0  5.0 - 8.0   Glucose, UA NEGATIVE  NEGATIVE mg/dL   Hgb urine dipstick MODERATE (*) NEGATIVE   Bilirubin Urine SMALL (*) NEGATIVE   Ketones, ur 15 (*) NEGATIVE mg/dL   Protein, ur 100 (*) NEGATIVE mg/dL   Urobilinogen, UA 0.2  0.0 - 1.0 mg/dL   Nitrite NEGATIVE  NEGATIVE   Leukocytes, UA NEGATIVE  NEGATIVE  URINE MICROSCOPIC-ADD ON     Status: None   Collection Time    03/02/14  5:25 PM      Result Value Ref Range   Squamous Epithelial / LPF RARE  RARE   WBC, UA 0-2  <3 WBC/hpf    RBC / HPF 7-10  <3 RBC/hpf   Bacteria, UA RARE  RARE  TROPONIN I     Status: None   Collection Time    03/02/14  8:49 PM      Result Value Ref Range   Troponin I <0.30  <0.30 ng/mL   Comment:            Due to the release kinetics of cTnI,     a negative result within the first hours     of the onset of symptoms does not rule out     myocardial infarction with certainty.     If myocardial infarction is still suspected,     repeat the test at appropriate intervals.  GLUCOSE, CAPILLARY     Status: Abnormal   Collection Time    03/02/14 10:21 PM      Result Value Ref Range   Glucose-Capillary 121 (*) 70 - 99 mg/dL   Comment 1 Notify RN    TROPONIN I     Status: None   Collection Time    03/03/14  2:22 AM      Result Value Ref Range   Troponin I <0.30  <0.30 ng/mL   Comment:            Due to the release kinetics of cTnI,     a negative result within the first hours     of the onset of symptoms does not rule out     myocardial infarction with certainty.     If myocardial infarction is still suspected,     repeat the test at appropriate intervals.  CBC     Status: None   Collection Time    03/03/14  2:22 AM      Result Value Ref Range   WBC 9.6  4.0 - 10.5 K/uL   RBC 4.46  3.87 - 5.11 MIL/uL   Hemoglobin 12.2  12.0 - 15.0 g/dL   HCT 37.2  36.0 - 46.0 %   MCV 83.4  78.0 - 100.0 fL   MCH 27.4  26.0 - 34.0 pg   MCHC 32.8  30.0 - 36.0 g/dL   RDW 15.5  11.5 - 15.5 %   Platelets 230  150 - 400 K/uL  COMPREHENSIVE METABOLIC PANEL     Status: Abnormal   Collection Time    03/03/14  2:22 AM      Result Value Ref Range   Sodium 141  137 - 147 mEq/L   Potassium 3.1 (*) 3.7 - 5.3 mEq/L  Chloride 100  96 - 112 mEq/L   CO2 26  19 - 32 mEq/L   Glucose, Bld 119 (*) 70 - 99 mg/dL   BUN 23  6 - 23 mg/dL   Creatinine, Ser 1.38 (*) 0.50 - 1.10 mg/dL   Calcium 9.6  8.4 - 10.5 mg/dL   Total Protein 7.7  6.0 - 8.3 g/dL   Albumin 3.7  3.5 - 5.2 g/dL   AST 28  0 - 37 U/L   ALT 14  0 -  35 U/L   Alkaline Phosphatase 67  39 - 117 U/L   Total Bilirubin 0.4  0.3 - 1.2 mg/dL   GFR calc non Af Amer 39 (*) >90 mL/min   GFR calc Af Amer 45 (*) >90 mL/min   Comment: (NOTE)     The eGFR has been calculated using the CKD EPI equation.     This calculation has not been validated in all clinical situations.     eGFR's persistently <90 mL/min signify possible Chronic Kidney     Disease.   Anion gap 15  5 - 15  GLUCOSE, CAPILLARY     Status: Abnormal   Collection Time    03/03/14  7:47 AM      Result Value Ref Range   Glucose-Capillary 121 (*) 70 - 99 mg/dL   Comment 1 Notify RN    TROPONIN I     Status: None   Collection Time    03/03/14  8:07 AM      Result Value Ref Range   Troponin I <0.30  <0.30 ng/mL   Comment:            Due to the release kinetics of cTnI,     a negative result within the first hours     of the onset of symptoms does not rule out     myocardial infarction with certainty.     If myocardial infarction is still suspected,     repeat the test at appropriate intervals.  GLUCOSE, CAPILLARY     Status: Abnormal   Collection Time    03/03/14 12:01 PM      Result Value Ref Range   Glucose-Capillary 132 (*) 70 - 99 mg/dL   Comment 1 Notify RN     Comment 2 Documented in Chart    GLUCOSE, CAPILLARY     Status: Abnormal   Collection Time    03/03/14  4:59 PM      Result Value Ref Range   Glucose-Capillary 124 (*) 70 - 99 mg/dL   Comment 1 Notify RN      Ct Head Wo Contrast  03/02/2014   CLINICAL DATA:  Altered mental status. Hallucinations. No known injury.  EXAM: CT HEAD WITHOUT CONTRAST  TECHNIQUE: Contiguous axial images were obtained from the base of the skull through the vertex without intravenous contrast.  COMPARISON:  None.  FINDINGS: There is no evidence of acute intracranial hemorrhage, mass lesion, brain edema or extra-axial fluid collection. The ventricles and subarachnoid spaces are appropriately sized for age. There is no CT evidence of acute  cortical infarction. Intracranial vascular calcifications are noted.  The visualized paranasal sinuses, mastoid air cells and middle ears are clear. The calvarium is intact.  IMPRESSION: Unremarkable noncontrast head CT.   Electronically Signed   By: Camie Patience M.D.   On: 03/02/2014 18:15   Mr Brain Wo Contrast  03/02/2014   CLINICAL DATA:  Altered mental status.  Evaluate for stroke.  EXAM: MRI HEAD WITHOUT CONTRAST  TECHNIQUE: Multiplanar, multiecho pulse sequences of the brain and surrounding structures were obtained without intravenous contrast.  COMPARISON:  Prior CT performed earlier on the same day.  FINDINGS: Study is degraded by motion artifact.  Mild diffuse prominence of the CSF containing spaces is compatible with generalized cerebral atrophy. Few scattered subcentimeter T2/FLAIR hyperintensity seen within the periventricular white matter, nonspecific, but likely related to minimal small vessel changes.  No mass lesion, midline shift, or extra-axial fluid collection. Ventricles are normal in size without evidence of hydrocephalus.  No diffusion-weighted signal abnormality is identified to suggest acute intracranial infarct. Gray-white matter differentiation is maintained. Normal flow voids are seen within the intracranial vasculature. No intracranial hemorrhage identified.  The cervicomedullary junction is normal. Pituitary gland is within normal limits. Pituitary stalk is midline. No acute abnormality seen about the globes and optic nerves.  The bone marrow signal intensity is normal. Calvarium is intact. Degenerative changes noted within the partially visualized upper cervical spine.  Scalp soft tissues are unremarkable.  Paranasal sinuses are clear. Scattered fluid signal intensity noted within the right mastoid air cells inferiorly.  IMPRESSION: 1. No acute intracranial infarct or other abnormality identified. 2. Mild age-related cerebral atrophy.   Electronically Signed   By: Jeannine Boga M.D.   On: 03/02/2014 22:46   Dg Chest Port 1 View  03/02/2014   CLINICAL DATA:  Altered mental status.  EXAM: PORTABLE CHEST - 1 VIEW  COMPARISON:  02/23/2011.  FINDINGS: The cardiac silhouette, mediastinal and hilar contours are within normal limits and stable. Right infrahilar scarring changes are noted. No infiltrates, edema or effusions. The bony thorax is intact.  IMPRESSION: No acute cardiopulmonary findings.   Electronically Signed   By: Kalman Jewels M.D.   On: 03/02/2014 17:46        Adelyna Brockman A. Merlene Laughter, M.D.  Diplomate, Tax adviser of Psychiatry and Neurology ( Neurology). 03/03/2014, 7:15 PM

## 2014-03-03 NOTE — Care Management Note (Addendum)
    Page 1 of 2   03/05/2014     11:34:30 AM CARE MANAGEMENT NOTE 03/05/2014  Patient:  [name],[name]   Account Number:  [id number]  Date Initiated:  03/03/2014  Documentation initiated by:  Sharrie RothmanBLACKWELL,Marlow Hendrie C  Subjective/Objective Assessment:   Pt admitted from home with altered mental status. Pt lives with her husband and will return home at discharge. Pt has been independent with ADL's prior to a couple of days before hospitalization. Pt has a walker from a previous surgery.     Action/Plan:   Will continue to follow for discharge planning needs.   Anticipated DC Date:  03/05/2014   Anticipated DC Plan:  HOME/SELF CARE      DC Planning Services  CM consult      [facility]  [facility]   Choice offered to / List presented to:  C-3 Spouse        HH arranged  HH-1 RN  HH-2 PT      HH agency  [facility]   Status of service:  Completed, signed off Medicare Important Message given?   (If response is "NO", the following Medicare IM given date fields will be blank) Date Medicare IM given:   Medicare IM given by:   Date Additional Medicare IM given:   Additional Medicare IM given by:    Discharge Disposition:  HOME/SELF CARE  Per UR Regulation:    If discussed at Long Length of Stay Meetings, dates discussed:    Comments:  03/05/14 1130 Arlyss Queenammy Shawn Dannenberg, RN BSN CM [facility]per pts husband choice). [facility] and will collect the pts information from the chart. HH services to start within 48 hours of discharge. Pt has a cane, walker, and BSC for home use. No other DME needs noted. Pts husband and pts nurse aware of discharge arrangements.  03/03/14 1415 Arlyss Queenammy Neliah Cuyler, RN BSN CM

## 2014-03-04 ENCOUNTER — Observation Stay (HOSPITAL_COMMUNITY)
Admit: 2014-03-04 | Discharge: 2014-03-04 | Disposition: A | Discharge status: Home / Self Care | Payer: Medicare Other | Attending: Neurology | Admitting: Neurology

## 2014-03-04 LAB — TSH: TSH: 1.27 u[IU]/mL (ref 0.350–4.500)

## 2014-03-04 LAB — HOMOCYSTEINE: Homocysteine: 13.1 umol/L (ref 4.0–15.4)

## 2014-03-04 LAB — GLUCOSE, CAPILLARY
Glucose-Capillary: 117 mg/dL — ABNORMAL HIGH (ref 70–99)
Glucose-Capillary: 136 mg/dL — ABNORMAL HIGH (ref 70–99)

## 2014-03-04 LAB — C-REACTIVE PROTEIN: CRP: <0.5 mg/dL — ABNORMAL LOW (ref <0.60)

## 2014-03-04 LAB — VITAMIN B12: Vitamin B-12: >2000 pg/mL — ABNORMAL HIGH (ref 211–911)

## 2014-03-04 MED ORDER — ALPRAZOLAM 0.5 MG PO TABS
0.5000 mg | ORAL_TABLET | Freq: Three times a day (TID) | ORAL | Status: DC
  Administered 2014-03-04 – 2014-03-05 (×4): 0.5 mg via ORAL
  Filled 2014-03-04 (×4): qty 1

## 2014-03-04 MED ORDER — LORAZEPAM 2 MG/ML IJ SOLN
1.0000 mg | INTRAMUSCULAR | Status: DC | PRN
  Administered 2014-03-04 – 2014-03-05 (×4): 1 mg via INTRAVENOUS
  Filled 2014-03-04 (×4): qty 1

## 2014-03-04 MED ORDER — LORAZEPAM 1 MG PO TABS: 1.0000 mg | ORAL_TABLET | ORAL | Status: DC | PRN

## 2014-03-04 NOTE — Progress Notes (Signed)
Patient ID: ROSELIN WIEMANN, female   DOB: Feb 23, 1946, 68 y.o.   MRN: [id number]  [place] A. Merlene Laughter, MD     [email or web address]          TWANNA RESH is an 68 y.o. female.   Assessment/Plan: 1. Toxic metabolic encephalopathy likely multifactorial. The prime suspect is multiple psychotropic medications particularly overuse of Lyrica. Additionally, review of her creatinine function indicates that it has been declining which undoubtedly contributes to low clearance of medications. Her creatinine was 0.6 but has more than doubled Over the years. My recommendation is that we should discontinue the Lyrica which has been done. I will also discontinue the fentanyl patch for now. EEG is fine. Continue with when necessary Ativan as needed.  It appears that the patient has had some agitation today. She has been given Ativan. Husband reports this has made a significant difference. She clearly is improved on examination. She reports no complaints today. She states that she is better.   GENERAL: Patient is agitated and irritated but in no acute distress. He is restless.  HEENT: Supple. Atraumatic normocephalic.  ABDOMEN: soft  EXTREMITIES:Puffy nonpitting edema of the legs. Marked arthritic changes of the knees.  BACK: Normal.  SKIN: Normal by inspection.  MENTAL STATUS: She is sleeping but easily arousable. She is a lot improved. She is not restless or agitated at this point. She status post Ativan. She is oriented to hospital and follows commands briskly. She speaks in full Comprehendible  sentences. CRANIAL NERVES: Pupils are equal, round and reactive to light and accommodation; extra ocular movements are full, there is no significant nystagmus; visual fields are full; upper and lower facial muscles are normal in strength and symmetric, there is no flattening of the nasolabial folds; tongue is midline; uvula is midline; shoulder elevation is normal.  MOTOR: Normal tone, bulk and  strength; no pronator drift.  COORDINATION: Left finger to nose is normal, right finger to nose is normal, No rest tremor; no intention tremor; no postural tremor; no bradykinesia.        Objective: Vital signs in last 24 hours: Temp:  [97.1 F (36.2 C)-98.6 F (37 C)] 98.6 F (37 C) (07/09 1436) Pulse Rate:  [73-76] 73 (07/09 1436) Resp:  [20] 20 (07/09 1436) BP: (162-164)/(80-84) 162/80 mmHg (07/09 1436) SpO2:  [97 %-100 %] 97 % (07/09 1436) Weight:  [98.703 kg (217 lb 9.6 oz)] 98.703 kg (217 lb 9.6 oz) (07/09 0534)  Intake/Output from previous day: 07/08 0701 - 07/09 0700 In: 621.3 [I.V.:621.3] Out: -  Intake/Output this shift:   Nutritional status: General   Lab Results: Results for orders placed during the hospital encounter of 03/02/14 (from the past 48 hour(s))  TROPONIN I     Status: None   Collection Time    03/02/14  8:49 PM      Result Value Ref Range   Troponin I <0.30  <0.30 ng/mL   Comment:            Due to the release kinetics of cTnI,     a negative result within the first hours     of the onset of symptoms does not rule out     myocardial infarction with certainty.     If myocardial infarction is still suspected,     repeat the test at appropriate intervals.  GLUCOSE, CAPILLARY     Status: Abnormal   Collection Time    03/02/14 10:21 PM      Result  Value Ref Range   Glucose-Capillary 121 (*) 70 - 99 mg/dL   Comment 1 Notify RN    TROPONIN I     Status: None   Collection Time    03/03/14  2:22 AM      Result Value Ref Range   Troponin I <0.30  <0.30 ng/mL   Comment:            Due to the release kinetics of cTnI,     a negative result within the first hours     of the onset of symptoms does not rule out     myocardial infarction with certainty.     If myocardial infarction is still suspected,     repeat the test at appropriate intervals.  CBC     Status: None   Collection Time    03/03/14  2:22 AM      Result Value Ref Range   WBC 9.6   4.0 - 10.5 K/uL   RBC 4.46  3.87 - 5.11 MIL/uL   Hemoglobin 12.2  12.0 - 15.0 g/dL   HCT 37.2  36.0 - 46.0 %   MCV 83.4  78.0 - 100.0 fL   MCH 27.4  26.0 - 34.0 pg   MCHC 32.8  30.0 - 36.0 g/dL   RDW 15.5  11.5 - 15.5 %   Platelets 230  150 - 400 K/uL  COMPREHENSIVE METABOLIC PANEL     Status: Abnormal   Collection Time    03/03/14  2:22 AM      Result Value Ref Range   Sodium 141  137 - 147 mEq/L   Potassium 3.1 (*) 3.7 - 5.3 mEq/L   Chloride 100  96 - 112 mEq/L   CO2 26  19 - 32 mEq/L   Glucose, Bld 119 (*) 70 - 99 mg/dL   BUN 23  6 - 23 mg/dL   Creatinine, Ser 1.38 (*) 0.50 - 1.10 mg/dL   Calcium 9.6  8.4 - 10.5 mg/dL   Total Protein 7.7  6.0 - 8.3 g/dL   Albumin 3.7  3.5 - 5.2 g/dL   AST 28  0 - 37 U/L   ALT 14  0 - 35 U/L   Alkaline Phosphatase 67  39 - 117 U/L   Total Bilirubin 0.4  0.3 - 1.2 mg/dL   GFR calc non Af Amer 39 (*) >90 mL/min   GFR calc Af Amer 45 (*) >90 mL/min   Comment: (NOTE)     The eGFR has been calculated using the CKD EPI equation.     This calculation has not been validated in all clinical situations.     eGFR's persistently <90 mL/min signify possible Chronic Kidney     Disease.   Anion gap 15  5 - 15  GLUCOSE, CAPILLARY     Status: Abnormal   Collection Time    03/03/14  7:47 AM      Result Value Ref Range   Glucose-Capillary 121 (*) 70 - 99 mg/dL   Comment 1 Notify RN    TROPONIN I     Status: None   Collection Time    03/03/14  8:07 AM      Result Value Ref Range   Troponin I <0.30  <0.30 ng/mL   Comment:            Due to the release kinetics of cTnI,     a negative result within the first hours     of  the onset of symptoms does not rule out     myocardial infarction with certainty.     If myocardial infarction is still suspected,     repeat the test at appropriate intervals.  GLUCOSE, CAPILLARY     Status: Abnormal   Collection Time    03/03/14 12:01 PM      Result Value Ref Range   Glucose-Capillary 132 (*) 70 - 99 mg/dL    Comment 1 Notify RN     Comment 2 Documented in Chart    GLUCOSE, CAPILLARY     Status: Abnormal   Collection Time    03/03/14  4:59 PM      Result Value Ref Range   Glucose-Capillary 124 (*) 70 - 99 mg/dL   Comment 1 Notify RN    TSH     Status: None   Collection Time    03/03/14  7:23 PM      Result Value Ref Range   TSH 1.270  0.350 - 4.500 uIU/mL   Comment: Performed at [place]     Status: None   Collection Time    03/03/14  7:24 PM      Result Value Ref Range   Sed Rate 13  0 - 22 mm/hr  GLUCOSE, CAPILLARY     Status: Abnormal   Collection Time    03/03/14  7:32 PM      Result Value Ref Range   Glucose-Capillary 125 (*) 70 - 99 mg/dL   Comment 1 Documented in Chart     Comment 2 Notify RN    VITAMIN B12     Status: Abnormal   Collection Time    03/04/14  6:31 AM      Result Value Ref Range   Vitamin B-12 >2000 (*) 211 - 911 pg/mL   Comment: Performed at [place]     Status: Abnormal   Collection Time    03/04/14  6:31 AM      Result Value Ref Range   CRP <0.5 (*) <0.60 mg/dL   Comment: Performed at [place]     Status: None   Collection Time    03/04/14  6:31 AM      Result Value Ref Range   Homocysteine 13.1  4.0 - 15.4 umol/L   Comment: Performed at [place], CAPILLARY     Status: Abnormal   Collection Time    03/04/14  7:31 AM      Result Value Ref Range   Glucose-Capillary 117 (*) 70 - 99 mg/dL   Comment 1 Notify RN    GLUCOSE, CAPILLARY     Status: Abnormal   Collection Time    03/04/14 11:53 AM      Result Value Ref Range   Glucose-Capillary 136 (*) 70 - 99 mg/dL   Comment 1 Notify RN      Lipid Panel No results found for this basename: CHOL, TRIG, HDL, CHOLHDL, VLDL, LDLCALC,  in the last 72 hours  Studies/Results: Mr Brain Wo Contrast  03/02/2014   CLINICAL DATA:  Altered mental status.  Evaluate for stroke.  EXAM: MRI HEAD WITHOUT  CONTRAST  TECHNIQUE: Multiplanar, multiecho pulse sequences of the brain and surrounding structures were obtained without intravenous contrast.  COMPARISON:  Prior CT performed earlier on the same day.  FINDINGS: Study is degraded by motion artifact.  Mild diffuse prominence of the CSF containing spaces is compatible  with generalized cerebral atrophy. Few scattered subcentimeter T2/FLAIR hyperintensity seen within the periventricular white matter, nonspecific, but likely related to minimal small vessel changes.  No mass lesion, midline shift, or extra-axial fluid collection. Ventricles are normal in size without evidence of hydrocephalus.  No diffusion-weighted signal abnormality is identified to suggest acute intracranial infarct. Gray-white matter differentiation is maintained. Normal flow voids are seen within the intracranial vasculature. No intracranial hemorrhage identified.  The cervicomedullary junction is normal. Pituitary gland is within normal limits. Pituitary stalk is midline. No acute abnormality seen about the globes and optic nerves.  The bone marrow signal intensity is normal. Calvarium is intact. Degenerative changes noted within the partially visualized upper cervical spine.  Scalp soft tissues are unremarkable.  Paranasal sinuses are clear. Scattered fluid signal intensity noted within the right mastoid air cells inferiorly.  IMPRESSION: 1. No acute intracranial infarct or other abnormality identified. 2. Mild age-related cerebral atrophy.   Electronically Signed   By: Jeannine Boga M.D.   On: 03/02/2014 22:46    Medications:  Scheduled Meds: . ALPRAZolam  0.5 mg Oral Q8H  . amLODipine  5 mg Oral Daily  . enoxaparin (LOVENOX) injection  40 mg Subcutaneous Q24H  . escitalopram  20 mg Oral QHS  . hydroxychloroquine  200 mg Oral Daily  . insulin aspart  0-9 Units Subcutaneous TID WC  . lisinopril  2.5 mg Oral Daily  . pantoprazole  40 mg Oral BID  . simvastatin  40 mg Oral QHS    . sodium chloride  3 mL Intravenous Q12H   Continuous Infusions: . 0.9 % NaCl with KCl 20 mEq / L 75 mL/hr at 03/04/14 1030   PRN Meds:.acetaminophen, acetaminophen, LORazepam, LORazepam, ondansetron (ZOFRAN) IV, ondansetron, oxyCODONE     LOS: 2 days   Abdinasir Spadafore A. Merlene Laughter, M.D.  Diplomate, Tax adviser of Psychiatry and Neurology ( Neurology).

## 2014-03-04 NOTE — Procedures (Signed)
[facility] [name] A. Tiffany Pilgrimoonquah, MD     [email or web address]           HISTORY: The patient is a 68 year old female who presents with confusion and altered mental status. This study is being done to evaluate for nonconvulsive seizures.  MEDICATIONS: Scheduled Meds: . ALPRAZolam  0.5 mg Oral Q8H  . amLODipine  5 mg Oral Daily  . enoxaparin (LOVENOX) injection  40 mg Subcutaneous Q24H  . escitalopram  20 mg Oral QHS  . hydroxychloroquine  200 mg Oral Daily  . insulin aspart  0-9 Units Subcutaneous TID WC  . lisinopril  2.5 mg Oral Daily  . pantoprazole  40 mg Oral BID  . simvastatin  40 mg Oral QHS  . sodium chloride  3 mL Intravenous Q12H   Continuous Infusions: . 0.9 % NaCl with KCl 20 mEq / L 75 mL/hr at 03/04/14 1030   PRN Meds:.acetaminophen, acetaminophen, LORazepam, LORazepam, ondansetron (ZOFRAN) IV, ondansetron, oxyCODONE  Prior to Admission medications   Medication Sig Start Date End Date Taking? Authorizing Provider  alendronate (FOSAMAX) 70 MG tablet Take 70 mg by mouth once a week. Take with a full glass of water on an empty stomach.   Yes Historical Provider, MD  amLODipine (NORVASC) 5 MG tablet Take 5 mg by mouth daily.   Yes Historical Provider, MD  Cyanocobalamin (B-12 PO) Take 1 tablet by mouth daily.   Yes Historical Provider, MD  darifenacin (ENABLEX) 15 MG 24 hr tablet Take 15 mg by mouth daily.   Yes Historical Provider, MD  escitalopram (LEXAPRO) 20 MG tablet Take 20 mg by mouth at bedtime.    Yes Historical Provider, MD  fentaNYL (DURAGESIC - DOSED MCG/HR) 50 MCG/HR Place 50 mcg onto the skin every 3 (three) days.   Yes Historical Provider, MD  furosemide (LASIX) 40 MG tablet Take 40 mg by mouth 2 (two) times daily.   Yes Historical Provider, MD  glipiZIDE (GLUCOTROL XL) 5 MG 24 hr tablet Take 5 mg by mouth daily with breakfast.   Yes Historical Provider, MD  hydroxychloroquine (PLAQUENIL) 200 MG tablet Take 200 mg by mouth daily.   Yes Historical  Provider, MD  lisinopril (PRINIVIL,ZESTRIL) 2.5 MG tablet Take 2.5 mg by mouth daily.   Yes Historical Provider, MD  methocarbamol (ROBAXIN) 500 MG tablet Take 500 mg by mouth every 6 (six) hours as needed for muscle spasms.   Yes Historical Provider, MD  Multiple Vitamin (MULTIVITAMIN WITH MINERALS) TABS tablet Take 1 tablet by mouth daily.   Yes Historical Provider, MD  oxycodone (OXY-IR) 5 MG capsule Take 10 mg by mouth 4 (four) times daily as needed for pain.   Yes Historical Provider, MD  pantoprazole (PROTONIX) 40 MG tablet Take 40 mg by mouth 2 (two) times daily.   Yes Historical Provider, MD  potassium chloride SA (K-DUR,KLOR-CON) 20 MEQ tablet Take 20 mEq by mouth 2 (two) times daily.   Yes Historical Provider, MD  pregabalin (LYRICA) 50 MG capsule Take 50 mg by mouth 3 (three) times daily.   Yes Historical Provider, MD  Pyridoxine HCl (B-6 PO) Take 1 tablet by mouth daily.   Yes Historical Provider, MD  simvastatin (ZOCOR) 40 MG tablet Take 40 mg by mouth at bedtime.   Yes Historical Provider, MD  sodium bicarbonate 650 MG tablet Take 650 mg by mouth 2 (two) times daily.   Yes Historical Provider, MD  solifenacin (VESICARE) 5 MG tablet Take 5 mg by mouth daily.   Yes Historical  Provider, MD  Vitamin D, Ergocalciferol, (DRISDOL) 50000 UNITS CAPS capsule Take 50,000 Units by mouth every 7 (seven) days. *take once weekly on the same day each week*   Yes Historical Provider, MD      ANALYSIS: A 16 channel recording using standard 10 20 measurements is conducted for 20 minutes. There is a well-formed posterior dominant rhythm that gets as high 7-1/2 Hz it attenuates without opening. There is beta activity observed in the frontal areas. Awake and drowsy activities are recorded. The patient is noted to have frequent jerking involving the extremities and head but without electrographic correlates. There are no focal or lateralized slowing. Photic simulation and hyperventilation were not carried  out. There are no epileptiform activities observed.   IMPRESSION:  1. This recording of the awake and drowsy states shows mild generalized slowing. Otherwise, there are no epileptiform activities observed.      Marykathleen Russi A. [name], M.D.  Diplomate, Biomedical engineer of Psychiatry and Neurology ( Neurology).

## 2014-03-04 NOTE — Progress Notes (Signed)
UR Completed Evanna Washinton Graves-Bigelow, RN,BSN [phone]  

## 2014-03-04 NOTE — Progress Notes (Signed)
Subjective: She has continued confused. After discussion with her husband she had been on Xanax at home but) out of his prior to developing hallucinations. This may be more of her problem. She has been very agitated. She did do better after she received Ativan intravenously. She had previously been on gabapentin and was switched to Lyrica about 2 months ago.  Objective: Vital signs in last 24 hours: Temp:  [97.1 F (36.2 C)-98.6 F (37 C)] 97.1 F (36.2 C) (07/09 0534) Pulse Rate:  [76-99] 76 (07/09 0534) Resp:  [20] 20 (07/09 0534) BP: (144-164)/(84-96) 164/84 mmHg (07/09 0534) SpO2:  [97 %-100 %] 100 % (07/09 0534) Weight:  [98.703 kg (217 lb 9.6 oz)] 98.703 kg (217 lb 9.6 oz) (07/09 0534) Weight change: 3.447 kg (7 lb 9.6 oz) Last BM Date: 02/27/14  Intake/Output from previous day: 07/08 0701 - 07/09 0700 In: 621.3 [I.V.:621.3] Out: -   PHYSICAL EXAM General appearance: alert, cooperative and moderately obese Resp: clear to auscultation bilaterally Cardio: regular rate and rhythm, S1, S2 normal, no murmur, click, rub or gallop GI: soft, non-tender; bowel sounds normal; no masses,  no organomegaly Extremities: extremities normal, atraumatic, no cyanosis or edema  Lab Results:  Results for orders placed during the hospital encounter of 03/02/14 (from the past 48 hour(s))  CBC WITH DIFFERENTIAL     Status: Abnormal   Collection Time    03/02/14  3:30 PM      Result Value Ref Range   WBC 10.5  4.0 - 10.5 K/uL   Comment: WHITE COUNT CONFIRMED ON SMEAR   RBC 4.75  3.87 - 5.11 MIL/uL   Hemoglobin 13.2  12.0 - 15.0 g/dL   HCT 39.5  36.0 - 46.0 %   MCV 83.2  78.0 - 100.0 fL   MCH 27.8  26.0 - 34.0 pg   MCHC 33.4  30.0 - 36.0 g/dL   RDW 15.2  11.5 - 15.5 %   Platelets 279  150 - 400 K/uL   Neutrophils Relative % 79 (*) 43 - 77 %   Lymphocytes Relative 15  12 - 46 %   Monocytes Relative 6  3 - 12 %   Eosinophils Relative 0  0 - 5 %   Basophils Relative 0  0 - 1 %   Neutro  Abs 8.3 (*) 1.7 - 7.7 K/uL   Lymphs Abs 1.6  0.7 - 4.0 K/uL   Monocytes Absolute 0.6  0.1 - 1.0 K/uL   Eosinophils Absolute 0.0  0.0 - 0.7 K/uL   Basophils Absolute 0.0  0.0 - 0.1 K/uL  COMPREHENSIVE METABOLIC PANEL     Status: Abnormal   Collection Time    03/02/14  3:30 PM      Result Value Ref Range   Sodium 141  137 - 147 mEq/L   Potassium 3.3 (*) 3.7 - 5.3 mEq/L   Chloride 101  96 - 112 mEq/L   CO2 22  19 - 32 mEq/L   Glucose, Bld 175 (*) 70 - 99 mg/dL   BUN 20  6 - 23 mg/dL   Creatinine, Ser 1.38 (*) 0.50 - 1.10 mg/dL   Calcium 9.7  8.4 - 10.5 mg/dL   Total Protein 8.4 (*) 6.0 - 8.3 g/dL   Albumin 4.0  3.5 - 5.2 g/dL   AST 33  0 - 37 U/L   ALT 16  0 - 35 U/L   Alkaline Phosphatase 75  39 - 117 U/L   Total Bilirubin 0.5  0.3 - 1.2 mg/dL   GFR calc non Af Amer 39 (*) >90 mL/min   GFR calc Af Amer 45 (*) >90 mL/min   Comment: (NOTE)     The eGFR has been calculated using the CKD EPI equation.     This calculation has not been validated in all clinical situations.     eGFR's persistently <90 mL/min signify possible Chronic Kidney     Disease.   Anion gap 18 (*) 5 - 15  TROPONIN I     Status: None   Collection Time    03/02/14  3:30 PM      Result Value Ref Range   Troponin I <0.30  <0.30 ng/mL   Comment:            Due to the release kinetics of cTnI,     a negative result within the first hours     of the onset of symptoms does not rule out     myocardial infarction with certainty.     If myocardial infarction is still suspected,     repeat the test at appropriate intervals.  URINALYSIS, ROUTINE W REFLEX MICROSCOPIC     Status: Abnormal   Collection Time    03/02/14  5:25 PM      Result Value Ref Range   Color, Urine YELLOW  YELLOW   APPearance CLEAR  CLEAR   Specific Gravity, Urine 1.020  1.005 - 1.030   pH 6.0  5.0 - 8.0   Glucose, UA NEGATIVE  NEGATIVE mg/dL   Hgb urine dipstick MODERATE (*) NEGATIVE   Bilirubin Urine SMALL (*) NEGATIVE   Ketones, ur 15 (*)  NEGATIVE mg/dL   Protein, ur 100 (*) NEGATIVE mg/dL   Urobilinogen, UA 0.2  0.0 - 1.0 mg/dL   Nitrite NEGATIVE  NEGATIVE   Leukocytes, UA NEGATIVE  NEGATIVE  URINE MICROSCOPIC-ADD ON     Status: None   Collection Time    03/02/14  5:25 PM      Result Value Ref Range   Squamous Epithelial / LPF RARE  RARE   WBC, UA 0-2  <3 WBC/hpf   RBC / HPF 7-10  <3 RBC/hpf   Bacteria, UA RARE  RARE  TROPONIN I     Status: None   Collection Time    03/02/14  8:49 PM      Result Value Ref Range   Troponin I <0.30  <0.30 ng/mL   Comment:            Due to the release kinetics of cTnI,     a negative result within the first hours     of the onset of symptoms does not rule out     myocardial infarction with certainty.     If myocardial infarction is still suspected,     repeat the test at appropriate intervals.  GLUCOSE, CAPILLARY     Status: Abnormal   Collection Time    03/02/14 10:21 PM      Result Value Ref Range   Glucose-Capillary 121 (*) 70 - 99 mg/dL   Comment 1 Notify RN    TROPONIN I     Status: None   Collection Time    03/03/14  2:22 AM      Result Value Ref Range   Troponin I <0.30  <0.30 ng/mL   Comment:            Due to the release kinetics of cTnI,     a  negative result within the first hours     of the onset of symptoms does not rule out     myocardial infarction with certainty.     If myocardial infarction is still suspected,     repeat the test at appropriate intervals.  CBC     Status: None   Collection Time    03/03/14  2:22 AM      Result Value Ref Range   WBC 9.6  4.0 - 10.5 K/uL   RBC 4.46  3.87 - 5.11 MIL/uL   Hemoglobin 12.2  12.0 - 15.0 g/dL   HCT 37.2  36.0 - 46.0 %   MCV 83.4  78.0 - 100.0 fL   MCH 27.4  26.0 - 34.0 pg   MCHC 32.8  30.0 - 36.0 g/dL   RDW 15.5  11.5 - 15.5 %   Platelets 230  150 - 400 K/uL  COMPREHENSIVE METABOLIC PANEL     Status: Abnormal   Collection Time    03/03/14  2:22 AM      Result Value Ref Range   Sodium 141  137 - 147  mEq/L   Potassium 3.1 (*) 3.7 - 5.3 mEq/L   Chloride 100  96 - 112 mEq/L   CO2 26  19 - 32 mEq/L   Glucose, Bld 119 (*) 70 - 99 mg/dL   BUN 23  6 - 23 mg/dL   Creatinine, Ser 1.38 (*) 0.50 - 1.10 mg/dL   Calcium 9.6  8.4 - 10.5 mg/dL   Total Protein 7.7  6.0 - 8.3 g/dL   Albumin 3.7  3.5 - 5.2 g/dL   AST 28  0 - 37 U/L   ALT 14  0 - 35 U/L   Alkaline Phosphatase 67  39 - 117 U/L   Total Bilirubin 0.4  0.3 - 1.2 mg/dL   GFR calc non Af Amer 39 (*) >90 mL/min   GFR calc Af Amer 45 (*) >90 mL/min   Comment: (NOTE)     The eGFR has been calculated using the CKD EPI equation.     This calculation has not been validated in all clinical situations.     eGFR's persistently <90 mL/min signify possible Chronic Kidney     Disease.   Anion gap 15  5 - 15  GLUCOSE, CAPILLARY     Status: Abnormal   Collection Time    03/03/14  7:47 AM      Result Value Ref Range   Glucose-Capillary 121 (*) 70 - 99 mg/dL   Comment 1 Notify RN    TROPONIN I     Status: None   Collection Time    03/03/14  8:07 AM      Result Value Ref Range   Troponin I <0.30  <0.30 ng/mL   Comment:            Due to the release kinetics of cTnI,     a negative result within the first hours     of the onset of symptoms does not rule out     myocardial infarction with certainty.     If myocardial infarction is still suspected,     repeat the test at appropriate intervals.  GLUCOSE, CAPILLARY     Status: Abnormal   Collection Time    03/03/14 12:01 PM      Result Value Ref Range   Glucose-Capillary 132 (*) 70 - 99 mg/dL   Comment 1 Notify RN  Comment 2 Documented in Chart    GLUCOSE, CAPILLARY     Status: Abnormal   Collection Time    03/03/14  4:59 PM      Result Value Ref Range   Glucose-Capillary 124 (*) 70 - 99 mg/dL   Comment 1 Notify RN    SEDIMENTATION RATE     Status: None   Collection Time    03/03/14  7:24 PM      Result Value Ref Range   Sed Rate 13  0 - 22 mm/hr  GLUCOSE, CAPILLARY     Status:  Abnormal   Collection Time    03/03/14  7:32 PM      Result Value Ref Range   Glucose-Capillary 125 (*) 70 - 99 mg/dL   Comment 1 Documented in Chart     Comment 2 Notify RN    GLUCOSE, CAPILLARY     Status: Abnormal   Collection Time    03/04/14  7:31 AM      Result Value Ref Range   Glucose-Capillary 117 (*) 70 - 99 mg/dL   Comment 1 Notify RN      ABGS No results found for this basename: PHART, PCO2, PO2ART, TCO2, HCO3,  in the last 72 hours CULTURES No results found for this or any previous visit (from the past 240 hour(s)). Studies/Results: Ct Head Wo Contrast  03/02/2014   CLINICAL DATA:  Altered mental status. Hallucinations. No known injury.  EXAM: CT HEAD WITHOUT CONTRAST  TECHNIQUE: Contiguous axial images were obtained from the base of the skull through the vertex without intravenous contrast.  COMPARISON:  None.  FINDINGS: There is no evidence of acute intracranial hemorrhage, mass lesion, brain edema or extra-axial fluid collection. The ventricles and subarachnoid spaces are appropriately sized for age. There is no CT evidence of acute cortical infarction. Intracranial vascular calcifications are noted.  The visualized paranasal sinuses, mastoid air cells and middle ears are clear. The calvarium is intact.  IMPRESSION: Unremarkable noncontrast head CT.   Electronically Signed   By: Camie Patience M.D.   On: 03/02/2014 18:15   Mr Brain Wo Contrast  03/02/2014   CLINICAL DATA:  Altered mental status.  Evaluate for stroke.  EXAM: MRI HEAD WITHOUT CONTRAST  TECHNIQUE: Multiplanar, multiecho pulse sequences of the brain and surrounding structures were obtained without intravenous contrast.  COMPARISON:  Prior CT performed earlier on the same day.  FINDINGS: Study is degraded by motion artifact.  Mild diffuse prominence of the CSF containing spaces is compatible with generalized cerebral atrophy. Few scattered subcentimeter T2/FLAIR hyperintensity seen within the periventricular white  matter, nonspecific, but likely related to minimal small vessel changes.  No mass lesion, midline shift, or extra-axial fluid collection. Ventricles are normal in size without evidence of hydrocephalus.  No diffusion-weighted signal abnormality is identified to suggest acute intracranial infarct. Gray-white matter differentiation is maintained. Normal flow voids are seen within the intracranial vasculature. No intracranial hemorrhage identified.  The cervicomedullary junction is normal. Pituitary gland is within normal limits. Pituitary stalk is midline. No acute abnormality seen about the globes and optic nerves.  The bone marrow signal intensity is normal. Calvarium is intact. Degenerative changes noted within the partially visualized upper cervical spine.  Scalp soft tissues are unremarkable.  Paranasal sinuses are clear. Scattered fluid signal intensity noted within the right mastoid air cells inferiorly.  IMPRESSION: 1. No acute intracranial infarct or other abnormality identified. 2. Mild age-related cerebral atrophy.   Electronically Signed   By: Marland Kitchen  Jeannine Boga M.D.   On: 03/02/2014 22:46   Dg Chest Port 1 View  03/02/2014   CLINICAL DATA:  Altered mental status.  EXAM: PORTABLE CHEST - 1 VIEW  COMPARISON:  02/23/2011.  FINDINGS: The cardiac silhouette, mediastinal and hilar contours are within normal limits and stable. Right infrahilar scarring changes are noted. No infiltrates, edema or effusions. The bony thorax is intact.  IMPRESSION: No acute cardiopulmonary findings.   Electronically Signed   By: Kalman Jewels M.D.   On: 03/02/2014 17:46    Medications:  Prior to Admission:  Prescriptions prior to admission  Medication Sig Dispense Refill  . alendronate (FOSAMAX) 70 MG tablet Take 70 mg by mouth once a week. Take with a full glass of water on an empty stomach.      Marland Kitchen amLODipine (NORVASC) 5 MG tablet Take 5 mg by mouth daily.      . Cyanocobalamin (B-12 PO) Take 1 tablet by mouth  daily.      Marland Kitchen darifenacin (ENABLEX) 15 MG 24 hr tablet Take 15 mg by mouth daily.      Marland Kitchen escitalopram (LEXAPRO) 20 MG tablet Take 20 mg by mouth at bedtime.       . fentaNYL (DURAGESIC - DOSED MCG/HR) 50 MCG/HR Place 50 mcg onto the skin every 3 (three) days.      . furosemide (LASIX) 40 MG tablet Take 40 mg by mouth 2 (two) times daily.      Marland Kitchen glipiZIDE (GLUCOTROL XL) 5 MG 24 hr tablet Take 5 mg by mouth daily with breakfast.      . hydroxychloroquine (PLAQUENIL) 200 MG tablet Take 200 mg by mouth daily.      Marland Kitchen lisinopril (PRINIVIL,ZESTRIL) 2.5 MG tablet Take 2.5 mg by mouth daily.      . methocarbamol (ROBAXIN) 500 MG tablet Take 500 mg by mouth every 6 (six) hours as needed for muscle spasms.      . Multiple Vitamin (MULTIVITAMIN WITH MINERALS) TABS tablet Take 1 tablet by mouth daily.      Marland Kitchen oxycodone (OXY-IR) 5 MG capsule Take 10 mg by mouth 4 (four) times daily as needed for pain.      . pantoprazole (PROTONIX) 40 MG tablet Take 40 mg by mouth 2 (two) times daily.      . potassium chloride SA (K-DUR,KLOR-CON) 20 MEQ tablet Take 20 mEq by mouth 2 (two) times daily.      . pregabalin (LYRICA) 50 MG capsule Take 50 mg by mouth 3 (three) times daily.      . Pyridoxine HCl (B-6 PO) Take 1 tablet by mouth daily.      . simvastatin (ZOCOR) 40 MG tablet Take 40 mg by mouth at bedtime.      . sodium bicarbonate 650 MG tablet Take 650 mg by mouth 2 (two) times daily.      . solifenacin (VESICARE) 5 MG tablet Take 5 mg by mouth daily.      . Vitamin D, Ergocalciferol, (DRISDOL) 50000 UNITS CAPS capsule Take 50,000 Units by mouth every 7 (seven) days. *take once weekly on the same day each week*       Scheduled: . ALPRAZolam  0.5 mg Oral Q8H  . amLODipine  5 mg Oral Daily  . enoxaparin (LOVENOX) injection  40 mg Subcutaneous Q24H  . escitalopram  20 mg Oral QHS  . hydroxychloroquine  200 mg Oral Daily  . insulin aspart  0-9 Units Subcutaneous TID WC  . lisinopril  2.5 mg Oral  Daily  .  pantoprazole  40 mg Oral BID  . simvastatin  40 mg Oral QHS  . sodium chloride  3 mL Intravenous Q12H   Continuous: . 0.9 % NaCl with KCl 20 mEq / L 75 mL/hr at 03/03/14 1708   UXY:[id number], acetaminophen, LORazepam, LORazepam, ondansetron (ZOFRAN) IV, ondansetron, oxyCODONE  Assesment: She was admitted with acute encephalopathy, altered mental status and hallucinations. This may be from withdrawal from alprazolam which she ran out of about a week ago. She also took too much of her Lyrica. It is clear that she's not going to be able to manage her medications. I discussed all this with her husband. Active Problems:   Encephalopathy   Altered mental status   Sarcoidosis   Chronic back pain    Plan: Restart Xanax continue other treatments.    LOS: 2 days   Ladashia Demarinis L 03/04/2014, 8:43 AM

## 2014-03-04 NOTE — Progress Notes (Signed)
EEG Completed; Results Pending  

## 2014-03-05 LAB — GLUCOSE, CAPILLARY
Glucose-Capillary: 130 mg/dL — ABNORMAL HIGH (ref 70–99)
Glucose-Capillary: 147 mg/dL — ABNORMAL HIGH (ref 70–99)
Glucose-Capillary: 115 mg/dL — ABNORMAL HIGH (ref 70–99)
Glucose-Capillary: 131 mg/dL — ABNORMAL HIGH (ref 70–99)

## 2014-03-05 MED ORDER — OXYCODONE HCL 5 MG PO CAPS: 5.0000 mg | ORAL_CAPSULE | Freq: Four times a day (QID) | ORAL | Status: DC | PRN

## 2014-03-05 MED ORDER — PREGABALIN 50 MG PO CAPS: 50.0000 mg | ORAL_CAPSULE | Freq: Every day | ORAL | Status: DC

## 2014-03-05 MED ORDER — FENTANYL 25 MCG/HR TD PT72: 25.0000 ug | MEDICATED_PATCH | TRANSDERMAL | Status: DC

## 2014-03-05 MED ORDER — ALPRAZOLAM 0.5 MG PO TABS: 0.5000 mg | ORAL_TABLET | Freq: Three times a day (TID) | ORAL | Status: DC

## 2014-03-05 NOTE — Progress Notes (Signed)
Subjective: She is much improved today. She's back to her baseline mental status. I discussed her situation with her husband and told him that she is classified as observation. We're going to see if she can get up and move around and if so I will discharge her later today  Objective: Vital signs in last 24 hours: Temp:  [98.5 F (36.9 C)-98.6 F (37 C)] 98.5 F (36.9 C) (07/10 0534) Pulse Rate:  [70-74] 70 (07/10 0534) Resp:  [20] 20 (07/10 0534) BP: (144-162)/(69-85) 144/69 mmHg (07/10 0534) SpO2:  [96 %-98 %] 98 % (07/10 0534) Weight change:  Last BM Date: 02/27/14  Intake/Output from previous day: 07/09 0701 - 07/10 0700 In: 3517.5 [P.O.:720; I.V.:2797.5] Out: -   PHYSICAL EXAM General appearance: alert, cooperative, no distress and moderately obese Resp: clear to auscultation bilaterally Cardio: regular rate and rhythm, S1, S2 normal, no murmur, click, rub or gallop GI: soft, non-tender; bowel sounds normal; no masses,  no organomegaly Extremities: extremities normal, atraumatic, no cyanosis or edema  Lab Results:  Results for orders placed during the hospital encounter of 03/02/14 (from the past 48 hour(s))  GLUCOSE, CAPILLARY     Status: Abnormal   Collection Time    03/03/14 12:01 PM      Result Value Ref Range   Glucose-Capillary 132 (*) 70 - 99 mg/dL   Comment 1 Notify RN     Comment 2 Documented in Chart    GLUCOSE, CAPILLARY     Status: Abnormal   Collection Time    03/03/14  4:59 PM      Result Value Ref Range   Glucose-Capillary 124 (*) 70 - 99 mg/dL   Comment 1 Notify RN    TSH     Status: None   Collection Time    03/03/14  7:23 PM      Result Value Ref Range   TSH 1.270  0.350 - 4.500 uIU/mL   Comment: [facility]  SEDIMENTATION RATE     Status: None   Collection Time    03/03/14  7:24 PM      Result Value Ref Range   Sed Rate 13  0 - 22 mm/hr  GLUCOSE, CAPILLARY     Status: Abnormal   Collection Time    03/03/14  7:32 PM       Result Value Ref Range   Glucose-Capillary 125 (*) 70 - 99 mg/dL   Comment 1 Documented in Chart     Comment 2 Notify RN    VITAMIN B12     Status: Abnormal   Collection Time    03/04/14  6:31 AM      Result Value Ref Range   Vitamin B-12 >2000 (*) 211 - 911 pg/mL   Comment: [facility]formed at [facility]  C-REACTIVE PROTEIN     Status: Abnormal   Collection Time    03/04/14  6:31 AM      Result Value Ref Range   CRP <0.5 (*) <0.60 mg/dL   Comment: Performed at [facility]  HOMOCYSTEINE     Status: None   Collection Time    03/04/14  6:31 AM      Result Value Ref Range   Homocysteine 13.1  4.0 - 15.4 umol/L   Comment: Performed at [facility]  GLUCOSE, CAPILLARY     Status: Abnormal   Collection Time    03/04/14  7:31 AM      Result Value Ref Range  Glucose-Capillary 117 (*) 70 - 99 mg/dL   Comment 1 Notify RN    GLUCOSE, CAPILLARY     Status: Abnormal   Collection Time    03/04/14 11:53 AM      Result Value Ref Range   Glucose-Capillary 136 (*) 70 - 99 mg/dL   Comment 1 Notify RN    GLUCOSE, CAPILLARY     Status: Abnormal   Collection Time    03/04/14  5:16 PM      Result Value Ref Range   Glucose-Capillary 130 (*) 70 - 99 mg/dL   Comment 1 Notify RN    GLUCOSE, CAPILLARY     Status: Abnormal   Collection Time    03/04/14  9:02 PM      Result Value Ref Range   Glucose-Capillary 147 (*) 70 - 99 mg/dL   Comment 1 Notify RN    GLUCOSE, CAPILLARY     Status: Abnormal   Collection Time    03/05/14  7:47 AM      Result Value Ref Range   Glucose-Capillary 115 (*) 70 - 99 mg/dL    ABGS No results found for this basename: PHART, PCO2, PO2ART, TCO2, HCO3,  in the last 72 hours CULTURES No results found for this or any previous visit (from the past 240 hour(s)). Studies/Results: No results found.  Medications:  Prior to Admission:  Prescriptions prior to admission  Medication Sig Dispense Refill  . alendronate (FOSAMAX) 70 MG tablet  Take 70 mg by mouth once a week. Take with a full glass of water on an empty stomach.      Marland Kitchen amLODipine (NORVASC) 5 MG tablet Take 5 mg by mouth daily.      . Cyanocobalamin (B-12 PO) Take 1 tablet by mouth daily.      Marland Kitchen darifenacin (ENABLEX) 15 MG 24 hr tablet Take 15 mg by mouth daily.      Marland Kitchen escitalopram (LEXAPRO) 20 MG tablet Take 20 mg by mouth at bedtime.       . fentaNYL (DURAGESIC - DOSED MCG/HR) 50 MCG/HR Place 50 mcg onto the skin every 3 (three) days.      . furosemide (LASIX) 40 MG tablet Take 40 mg by mouth 2 (two) times daily.      Marland Kitchen glipiZIDE (GLUCOTROL XL) 5 MG 24 hr tablet Take 5 mg by mouth daily with breakfast.      . hydroxychloroquine (PLAQUENIL) 200 MG tablet Take 200 mg by mouth daily.      Marland Kitchen lisinopril (PRINIVIL,ZESTRIL) 2.5 MG tablet Take 2.5 mg by mouth daily.      . methocarbamol (ROBAXIN) 500 MG tablet Take 500 mg by mouth every 6 (six) hours as needed for muscle spasms.      . Multiple Vitamin (MULTIVITAMIN WITH MINERALS) TABS tablet Take 1 tablet by mouth daily.      Marland Kitchen oxycodone (OXY-IR) 5 MG capsule Take 10 mg by mouth 4 (four) times daily as needed for pain.      . pantoprazole (PROTONIX) 40 MG tablet Take 40 mg by mouth 2 (two) times daily.      . potassium chloride SA (K-DUR,KLOR-CON) 20 MEQ tablet Take 20 mEq by mouth 2 (two) times daily.      . pregabalin (LYRICA) 50 MG capsule Take 50 mg by mouth 3 (three) times daily.      . Pyridoxine HCl (B-6 PO) Take 1 tablet by mouth daily.      . simvastatin (ZOCOR) 40 MG tablet Take  40 mg by mouth at bedtime.      . sodium bicarbonate 650 MG tablet Take 650 mg by mouth 2 (two) times daily.      . solifenacin (VESICARE) 5 MG tablet Take 5 mg by mouth daily.      . Vitamin D, Ergocalciferol, (DRISDOL) 50000 UNITS CAPS capsule Take 50,000 Units by mouth every 7 (seven) days. *take once weekly on the same day each week*       Scheduled: . ALPRAZolam  0.5 mg Oral Q8H  . amLODipine  5 mg Oral Daily  . enoxaparin  (LOVENOX) injection  40 mg Subcutaneous Q24H  . escitalopram  20 mg Oral QHS  . hydroxychloroquine  200 mg Oral Daily  . insulin aspart  0-9 Units Subcutaneous TID WC  . lisinopril  2.5 mg Oral Daily  . pantoprazole  40 mg Oral BID  . simvastatin  40 mg Oral QHS  . sodium chloride  3 mL Intravenous Q12H   Continuous: . 0.9 % NaCl with KCl 20 mEq / L 75 mL/hr at 03/05/14 1610   RUE:[id number], acetaminophen, LORazepam, LORazepam, ondansetron (ZOFRAN) IV, ondansetron, oxyCODONE  Assesment: She was admitted with acute encephalopathy and altered mental status it is probably related to Xanax withdrawal. She also took too much Lyrica. She is much improved. She has sarcoidosis which is been in remission and chronic pain from her back and from a severe fracture in her leg. Active Problems:   Encephalopathy   Altered mental status   Sarcoidosis   Chronic back pain    Plan: Likely discharge later today.    LOS: 3 days   Dorsey Authement L 03/05/2014, 8:35 AM

## 2014-03-05 NOTE — Discharge Summary (Signed)
Physician Discharge Summary  Patient ID: [name] MRN: [id number] DOB/AGE: 03/12/1946 68 y.o. Primary Care Physician:Veanna Dower L, MD Admit date: 03/02/2014 Discharge date: 03/05/2014    Discharge Diagnoses:   Active Problems:   Encephalopathy   Altered mental status   Sarcoidosis   Chronic back pain  hypertension Diabetes Overactive bladder Depression    Medication List    STOP taking these medications       fentaNYL 50 MCG/HR  Commonly known as:  DURAGESIC - dosed mcg/hr  Replaced by:  fentaNYL 25 MCG/HR patch     solifenacin 5 MG tablet  Commonly known as:  VESICARE      TAKE these medications       alendronate 70 MG tablet  Commonly known as:  FOSAMAX  Take 70 mg by mouth once a week. Take with a full glass of water on an empty stomach.     ALPRAZolam 0.5 MG tablet  Commonly known as:  XANAX  Take 1 tablet (0.5 mg total) by mouth every 8 (eight) hours.     amLODipine 5 MG tablet  Commonly known as:  NORVASC  Take 5 mg by mouth daily.     B-12 PO  Take 1 tablet by mouth daily.     B-6 PO  Take 1 tablet by mouth daily.     darifenacin 15 MG 24 hr tablet  Commonly known as:  ENABLEX  Take 15 mg by mouth daily.     escitalopram 20 MG tablet  Commonly known as:  LEXAPRO  Take 20 mg by mouth at bedtime.     fentaNYL 25 MCG/HR patch  Commonly known as:  DURAGESIC  Place 1 patch (25 mcg total) onto the skin every 3 (three) days.     furosemide 40 MG tablet  Commonly known as:  LASIX  Take 40 mg by mouth 2 (two) times daily.     glipiZIDE 5 MG 24 hr tablet  Commonly known as:  GLUCOTROL XL  Take 5 mg by mouth daily with breakfast.     hydroxychloroquine 200 MG tablet  Commonly known as:  PLAQUENIL  Take 200 mg by mouth daily.     lisinopril 2.5 MG tablet  Commonly known as:  PRINIVIL,ZESTRIL  Take 2.5 mg by mouth daily.     methocarbamol 500 MG tablet  Commonly known as:  ROBAXIN  Take 500 mg by mouth every 6 (six) hours as needed  for muscle spasms.     multivitamin with minerals Tabs tablet  Take 1 tablet by mouth daily.     oxycodone 5 MG capsule  Commonly known as:  OXY-IR  Take 1 capsule (5 mg total) by mouth 4 (four) times daily as needed for pain.     pantoprazole 40 MG tablet  Commonly known as:  PROTONIX  Take 40 mg by mouth 2 (two) times daily.     potassium chloride SA 20 MEQ tablet  Commonly known as:  K-DUR,KLOR-CON  Take 20 mEq by mouth 2 (two) times daily.     pregabalin 50 MG capsule  Commonly known as:  LYRICA  Take 1 capsule (50 mg total) by mouth at bedtime.     simvastatin 40 MG tablet  Commonly known as:  ZOCOR  Take 40 mg by mouth at bedtime.     sodium bicarbonate 650 MG tablet  Take 650 mg by mouth 2 (two) times daily.     Vitamin D (Ergocalciferol) 50000 UNITS Caps capsule  Commonly known as:  DRISDOL  Take 50,000 Units by mouth every 7 (seven) days. *take once weekly on the same day each week*        Discharged Condition: Improved    Consults: Neurology  Significant Diagnostic Studies: Ct Head Wo Contrast  03/02/2014   CLINICAL DATA:  Altered mental status. Hallucinations. No known injury.  EXAM: CT HEAD WITHOUT CONTRAST  TECHNIQUE: Contiguous axial images were obtained from the base of the skull through the vertex without intravenous contrast.  COMPARISON:  None.  FINDINGS: There is no evidence of acute intracranial hemorrhage, mass lesion, brain edema or extra-axial fluid collection. The ventricles and subarachnoid spaces are appropriately sized for age. There is no CT evidence of acute cortical infarction. Intracranial vascular calcifications are noted.  The visualized paranasal sinuses, mastoid air cells and middle ears are clear. The calvarium is intact.  IMPRESSION: Unremarkable noncontrast head CT.   Electronically Signed   By: Roxy HorsemanBill  Veazey M.D.   On: 03/02/2014 18:15   Mr Brain Wo Contrast  03/02/2014   CLINICAL DATA:  Altered mental status.  Evaluate for stroke.   EXAM: MRI HEAD WITHOUT CONTRAST  TECHNIQUE: Multiplanar, multiecho pulse sequences of the brain and surrounding structures were obtained without intravenous contrast.  COMPARISON:  Prior CT performed earlier on the same day.  FINDINGS: Study is degraded by motion artifact.  Mild diffuse prominence of the CSF containing spaces is compatible with generalized cerebral atrophy. Few scattered subcentimeter T2/FLAIR hyperintensity seen within the periventricular white matter, nonspecific, but likely related to minimal small vessel changes.  No mass lesion, midline shift, or extra-axial fluid collection. Ventricles are normal in size without evidence of hydrocephalus.  No diffusion-weighted signal abnormality is identified to suggest acute intracranial infarct. Gray-white matter differentiation is maintained. Normal flow voids are seen within the intracranial vasculature. No intracranial hemorrhage identified.  The cervicomedullary junction is normal. Pituitary gland is within normal limits. Pituitary stalk is midline. No acute abnormality seen about the globes and optic nerves.  The bone marrow signal intensity is normal. Calvarium is intact. Degenerative changes noted within the partially visualized upper cervical spine.  Scalp soft tissues are unremarkable.  Paranasal sinuses are clear. Scattered fluid signal intensity noted within the right mastoid air cells inferiorly.  IMPRESSION: 1. No acute intracranial infarct or other abnormality identified. 2. Mild age-related cerebral atrophy.   Electronically Signed   By: Rise MuBenjamin  McClintock M.D.   On: 03/02/2014 22:46   Dg Chest Port 1 View  03/02/2014   CLINICAL DATA:  Altered mental status.  EXAM: PORTABLE CHEST - 1 VIEW  COMPARISON:  02/23/2011.  FINDINGS: The cardiac silhouette, mediastinal and hilar contours are within normal limits and stable. Right infrahilar scarring changes are noted. No infiltrates, edema or effusions. The bony thorax is intact.  IMPRESSION: No  acute cardiopulmonary findings.   Electronically Signed   By: Loralie ChampagneMark  Gallerani M.D.   On: 03/02/2014 17:46    Lab Results: Basic Metabolic Panel:  Recent Labs  16/05/9606/07/15 1530 03/03/14 0222  NA 141 141  K 3.3* 3.1*  CL 101 100  CO2 22 26  GLUCOSE 175* 119*  BUN 20 23  CREATININE 1.38* 1.38*  CALCIUM 9.7 9.6   Liver Function Tests:  Recent Labs  03/02/14 1530 03/03/14 0222  AST 33 28  ALT 16 14  ALKPHOS 75 67  BILITOT 0.5 0.4  PROT 8.4* 7.7  ALBUMIN 4.0 3.7     CBC:  Recent Labs  03/02/14 1530 03/03/14 0222  WBC 10.5 9.6  NEUTROABS 8.3*  --   HGB 13.2 12.2  HCT 39.5 37.2  MCV 83.2 83.4  PLT 279 230    No results found for this or any previous visit (from the past 240 hour(s)).   Hospital Course: She was admitted with altered mental status. She had been hallucinating. Her husband found that she was taking 2 of her Lyrica at a time. It was thought that this might be the problem but she continued having difficulty even after the Lyrica was discontinued. I went back and reviewed her medications with him again and it turns out that she was on Xanax at home but had run out about 3 days prior to developing the problems. He did not have that on her home medication list because he only brought the bottles he could find that he could not find a bottle of Xanax. When she was started back on benzodiazepines she resumed her normal mental status. She is awake and oriented.   Discharge Exam: Blood pressure 144/69, pulse 70, temperature 98.5 F (36.9 C), temperature source Oral, resp. rate 20, height 5\' 8"  (1.727 m), weight 98.703 kg (217 lb 9.6 oz), SpO2 98.00%. She is awake and alert . Chest is clear.  Disposition: home with home health services. Significant reduction of her pain meds.      Discharge Instructions   Discharge patient    Complete by:  As directed   After lunch if she is able to ambulate     Face-to-face encounter (required for Medicare/Medicaid patients)     Complete by:  As directed   I Leba Tibbitts L certify that this patient is under my care and that I, or a nurse practitioner or physician's assistant working with me, had a face-to-face encounter that meets the physician face-to-face encounter requirements with this patient on 03/05/2014. The encounter with the patient was in whole, or in part for the following medical condition(s) which is the primary reason for home health care (List medical condition): Altered mental status/encephalopathy  The encounter with the patient was in whole, or in part, for the following medical condition, which is the primary reason for home health care:  Altered mental status/encephalopathy  I certify that, based on my findings, the following services are medically necessary home health services:   Nursing Physical therapy    My clinical findings support the need for the above services:  Shortness of breath with activity  Further, I certify that my clinical findings support that this patient is homebound due to:  Shortness of Breath with activity  Reason for Medically Necessary Home Health Services:  Skilled Nursing- Change/Decline in Patient Status     Home Health    Complete by:  As directed   To provide the following care/treatments:   PT RN               Signed: Lia Vigilante L   03/05/2014, 8:44 AM

## 2014-03-05 NOTE — Progress Notes (Signed)
UR Completed Khamari Sheehan Graves-Bigelow, RN,BSN [phone]  

## 2014-03-05 NOTE — Plan of Care (Signed)
Problem: Discharge Progression Outcomes Goal: Pain controlled with appropriate interventions Outcome: Completed/Met Date Met:  03/05/14 Denies pain

## 2014-11-16 ENCOUNTER — Ambulatory Visit (INDEPENDENT_AMBULATORY_CARE_PROVIDER_SITE_OTHER): Payer: Medicare Other | Admitting: Urology

## 2014-11-16 DIAGNOSIS — N3281 Overactive bladder: Secondary | ICD-10-CM

## 2014-11-16 DIAGNOSIS — R351 Nocturia: Secondary | ICD-10-CM | POA: Diagnosis not present

## 2015-03-10 ENCOUNTER — Other Ambulatory Visit (HOSPITAL_COMMUNITY): Payer: Self-pay | Admitting: Pulmonary Disease

## 2015-03-10 DIAGNOSIS — Z1231 Encounter for screening mammogram for malignant neoplasm of breast: Secondary | ICD-10-CM

## 2015-03-14 ENCOUNTER — Encounter (INDEPENDENT_AMBULATORY_CARE_PROVIDER_SITE_OTHER): Payer: Self-pay | Admitting: *Deleted

## 2015-03-21 ENCOUNTER — Ambulatory Visit (HOSPITAL_COMMUNITY): Payer: Medicare Other

## 2015-04-25 ENCOUNTER — Ambulatory Visit (HOSPITAL_COMMUNITY)
Admission: RE | Admit: 2015-04-25 | Discharge: 2015-04-25 | Disposition: A | Discharge status: Home / Self Care | Payer: Medicare Other | Source: Ambulatory Visit | Attending: Pulmonary Disease | Admitting: Pulmonary Disease

## 2015-04-25 DIAGNOSIS — Z1231 Encounter for screening mammogram for malignant neoplasm of breast: Secondary | ICD-10-CM | POA: Insufficient documentation

## 2016-02-01 ENCOUNTER — Other Ambulatory Visit (HOSPITAL_COMMUNITY): Payer: Self-pay | Admitting: Pulmonary Disease

## 2016-02-01 DIAGNOSIS — Z780 Asymptomatic menopausal state: Secondary | ICD-10-CM

## 2016-02-06 ENCOUNTER — Other Ambulatory Visit (HOSPITAL_COMMUNITY): Payer: Medicare Other

## 2016-03-13 ENCOUNTER — Ambulatory Visit (INDEPENDENT_AMBULATORY_CARE_PROVIDER_SITE_OTHER): Payer: Medicare Other | Admitting: Urology

## 2016-03-13 DIAGNOSIS — N3281 Overactive bladder: Secondary | ICD-10-CM

## 2016-05-22 ENCOUNTER — Other Ambulatory Visit (HOSPITAL_COMMUNITY): Payer: Self-pay | Admitting: Pulmonary Disease

## 2016-05-22 DIAGNOSIS — Z1231 Encounter for screening mammogram for malignant neoplasm of breast: Secondary | ICD-10-CM

## 2016-05-30 ENCOUNTER — Ambulatory Visit (HOSPITAL_COMMUNITY)
Admission: RE | Admit: 2016-05-30 | Discharge: 2016-05-30 | Disposition: A | Discharge status: Home / Self Care | Payer: Medicare Other | Source: Ambulatory Visit | Attending: Pulmonary Disease | Admitting: Pulmonary Disease

## 2016-05-30 DIAGNOSIS — M8588 Other specified disorders of bone density and structure, other site: Secondary | ICD-10-CM | POA: Diagnosis not present

## 2016-05-30 DIAGNOSIS — Z780 Asymptomatic menopausal state: Secondary | ICD-10-CM | POA: Diagnosis not present

## 2016-05-30 DIAGNOSIS — Z1231 Encounter for screening mammogram for malignant neoplasm of breast: Secondary | ICD-10-CM | POA: Insufficient documentation

## 2016-06-01 ENCOUNTER — Other Ambulatory Visit (HOSPITAL_COMMUNITY): Payer: Medicare Other

## 2017-01-18 ENCOUNTER — Encounter (HOSPITAL_COMMUNITY): Payer: Self-pay | Admitting: Emergency Medicine

## 2017-01-18 ENCOUNTER — Emergency Department (HOSPITAL_COMMUNITY)
Admission: EM | Admit: 2017-01-18 | Discharge: 2017-01-18 | Disposition: A | Discharge status: Home / Self Care | Payer: Medicare Other | Attending: Emergency Medicine | Admitting: Emergency Medicine

## 2017-01-18 ENCOUNTER — Emergency Department (HOSPITAL_COMMUNITY): Payer: Medicare Other

## 2017-01-18 DIAGNOSIS — R350 Frequency of micturition: Secondary | ICD-10-CM | POA: Diagnosis not present

## 2017-01-18 DIAGNOSIS — Z7984 Long term (current) use of oral hypoglycemic drugs: Secondary | ICD-10-CM | POA: Diagnosis not present

## 2017-01-18 DIAGNOSIS — I2510 Atherosclerotic heart disease of native coronary artery without angina pectoris: Secondary | ICD-10-CM | POA: Insufficient documentation

## 2017-01-18 DIAGNOSIS — Z79899 Other long term (current) drug therapy: Secondary | ICD-10-CM | POA: Insufficient documentation

## 2017-01-18 DIAGNOSIS — E119 Type 2 diabetes mellitus without complications: Secondary | ICD-10-CM | POA: Diagnosis not present

## 2017-01-18 DIAGNOSIS — K8681 Exocrine pancreatic insufficiency: Secondary | ICD-10-CM | POA: Diagnosis not present

## 2017-01-18 DIAGNOSIS — R300 Dysuria: Secondary | ICD-10-CM | POA: Diagnosis present

## 2017-01-18 LAB — URINALYSIS, ROUTINE W REFLEX MICROSCOPIC
Bilirubin Urine: NEGATIVE
Glucose, UA: NEGATIVE mg/dL
Hgb urine dipstick: NEGATIVE
Ketones, ur: NEGATIVE mg/dL
Nitrite: NEGATIVE
Protein, ur: NEGATIVE mg/dL
Specific Gravity, Urine: 1.01 (ref 1.005–1.030)
pH: 7 (ref 5.0–8.0)

## 2017-01-18 LAB — CBC WITH DIFFERENTIAL/PLATELET
Basophils Absolute: 0 10*3/uL (ref 0.0–0.1)
Basophils Relative: 1 %
Eosinophils Absolute: 0.1 10*3/uL (ref 0.0–0.7)
Eosinophils Relative: 2 %
HCT: 38.2 % (ref 36.0–46.0)
Hemoglobin: 12.3 g/dL (ref 12.0–15.0)
Lymphocytes Relative: 42 %
Lymphs Abs: 1.9 10*3/uL (ref 0.7–4.0)
MCH: 28.5 pg (ref 26.0–34.0)
MCHC: 32.2 g/dL (ref 30.0–36.0)
MCV: 88.6 fL (ref 78.0–100.0)
Monocytes Absolute: 0.4 10*3/uL (ref 0.1–1.0)
Monocytes Relative: 9 %
Neutro Abs: 2.1 10*3/uL (ref 1.7–7.7)
Neutrophils Relative %: 46 %
Platelets: 173 10*3/uL (ref 150–400)
RBC: 4.31 MIL/uL (ref 3.87–5.11)
RDW: 15.8 % — ABNORMAL HIGH (ref 11.5–15.5)
WBC: 4.6 10*3/uL (ref 4.0–10.5)

## 2017-01-18 LAB — COMPREHENSIVE METABOLIC PANEL
ALT: 10 U/L — ABNORMAL LOW (ref 14–54)
AST: 20 U/L (ref 15–41)
Albumin: 3.9 g/dL (ref 3.5–5.0)
Alkaline Phosphatase: 38 U/L (ref 38–126)
Anion gap: 7 (ref 5–15)
BUN: 14 mg/dL (ref 6–20)
CO2: 26 mmol/L (ref 22–32)
Calcium: 9.1 mg/dL (ref 8.9–10.3)
Chloride: 105 mmol/L (ref 101–111)
Creatinine, Ser: 1.11 mg/dL — ABNORMAL HIGH (ref 0.44–1.00)
GFR calc Af Amer: 58 mL/min — ABNORMAL LOW (ref >60)
GFR calc non Af Amer: 50 mL/min — ABNORMAL LOW (ref >60)
Glucose, Bld: 84 mg/dL (ref 65–99)
Potassium: 3.8 mmol/L (ref 3.5–5.1)
Sodium: 138 mmol/L (ref 135–145)
Total Bilirubin: 0.5 mg/dL (ref 0.3–1.2)
Total Protein: 7 g/dL (ref 6.5–8.1)

## 2017-01-18 MED ORDER — TRAMADOL HCL 50 MG PO TABS: 50.0000 mg | ORAL_TABLET | Freq: Four times a day (QID) | ORAL | 0 refills | Status: DC | PRN

## 2017-01-18 NOTE — ED Provider Notes (Signed)
[facility]Y DEPT Provider Note   CSN: [id number] Arrival date & time: 01/18/17  0705     History   Chief Complaint Chief Complaint  Patient presents with  . Dysuria    HPI [name] is a 71 y.o. female.  Patient states that she's been having dysuria and frequency. She also has some lower abdominal discomfort. Patient has a history of uric to logical problems.   The history is provided by the patient. No language interpreter was used.  Dysuria   This is a recurrent problem. The problem occurs intermittently. The problem has not changed since onset.The quality of the pain is described as burning. The pain is at a severity of 4/10. The pain is moderate. There has been no fever. She is sexually active. There is no history of pyelonephritis. Pertinent negatives include no chills, no frequency and no hematuria. She has tried nothing for the symptoms.    Past Medical History:  Diagnosis Date  . Arthritis   . Coronary artery disease   . Diabetes mellitus without complication (HCC)   . Osteoporosis   . Renal disorder   . Sarcoidosis     Patient Active Problem List   Diagnosis Date Noted  . Encephalopathy 03/02/2014  . Altered mental status 03/02/2014  . Sarcoidosis 03/02/2014  . Chronic back pain 03/02/2014    Past Surgical History:  Procedure Laterality Date  . BACK SURGERY    . FRACTURE SURGERY      OB History    No data available       Home Medications    Prior to Admission medications   Medication Sig Start Date End Date Taking? Authorizing Provider  ALPRAZolam Prudy Feeler) 0.5 MG tablet Take 1 tablet (0.5 mg total) by mouth every 8 (eight) hours. 03/05/14  Yes Kari Baars, MD  amLODipine (NORVASC) 5 MG tablet Take 5 mg by mouth daily.   Yes [provider]  darifenacin (ENABLEX) 15 MG 24 hr tablet Take 15 mg by mouth daily.   Yes [provider]  escitalopram (LEXAPRO) 20 MG tablet Take 20 mg by mouth at bedtime.    Yes [provider]  fentaNYL (DURAGESIC) 25 MCG/HR patch Place 1 patch (25 mcg total) onto the skin every 3 (three) days. 03/05/14  Yes Kari Baars, MD  furosemide (LASIX) 40 MG tablet Take 40 mg by mouth 2 (two) times daily.   Yes [provider]  glipiZIDE (GLUCOTROL XL) 5 MG 24 hr tablet Take 5 mg by mouth daily with breakfast.   Yes [provider]  hydroxychloroquine (PLAQUENIL) 200 MG tablet Take 200 mg by mouth daily.   Yes [provider]  lisinopril (PRINIVIL,ZESTRIL) 2.5 MG tablet Take 2.5 mg by mouth daily.   Yes [provider]  methocarbamol (ROBAXIN) 500 MG tablet Take 500 mg by mouth every 6 (six) hours as needed for muscle spasms.   Yes [provider]  pantoprazole (PROTONIX) 40 MG tablet Take 40 mg by mouth 2 (two) times daily.   Yes [provider]  potassium chloride SA (K-DUR,KLOR-CON) 20 MEQ tablet Take 20 mEq by mouth 2 (two) times daily.   Yes [provider]  pregabalin (LYRICA) 50 MG capsule Take 1 capsule (50 mg total) by mouth at bedtime. 03/05/14  Yes Kari Baars, MD  simvastatin (ZOCOR) 40 MG tablet Take 40 mg by mouth at bedtime.   Yes [provider]  sodium bicarbonate 650 MG tablet Take 650 mg by mouth 2 (two)  times daily.   Yes [provider]  alendronate (FOSAMAX) 70 MG tablet Take 70 mg by mouth once a week. Take with a full glass of water on an empty stomach.    [provider]  oxycodone (OXY-IR) 5 MG capsule Take 1 capsule (5 mg total) by mouth 4 (four) times daily as needed for pain. 03/05/14   Kari Baars, MD  traMADol (ULTRAM) 50 MG tablet Take 1 tablet (50 mg total) by mouth every 6 (six) hours as needed. 01/18/17   Bethann Berkshire, MD  Vitamin D, Ergocalciferol, (DRISDOL) 50000 UNITS CAPS capsule Take 50,000 Units by mouth every 7 (seven) days. *take once weekly on the same day each week*    [provider]    Family History History reviewed. No  pertinent family history.  Social History Social History  Substance Use Topics  . Smoking status: Never Smoker  . Smokeless tobacco: Not on file  . Alcohol use No     Allergies   Darvon [propoxyphene]; Morphine and related; and Penicillins   Review of Systems Review of Systems  Constitutional: Negative for appetite change, chills and fatigue.  HENT: Negative for congestion, ear discharge and sinus pressure.   Eyes: Negative for discharge.  Respiratory: Negative for cough.   Cardiovascular: Negative for chest pain.  Gastrointestinal: Negative for abdominal pain and diarrhea.  Genitourinary: Positive for dysuria. Negative for frequency and hematuria.  Musculoskeletal: Negative for back pain.  Skin: Negative for rash.  Neurological: Negative for seizures and headaches.  Psychiatric/Behavioral: Negative for hallucinations.     Physical Exam Updated Vital Signs BP 103/82 (BP Location: Right Arm)   Pulse 70   Temp 98.2 F (36.8 C) (Oral)   Resp 18   Ht 5\' 8"  (1.727 m)   Wt 99.8 kg (220 lb)   SpO2 93%   BMI 33.45 kg/m   Physical Exam  Constitutional: She is oriented to person, place, and time. She appears well-developed.  HENT:  Head: Normocephalic.  Eyes: Conjunctivae and EOM are normal. No scleral icterus.  Neck: Neck supple. No thyromegaly present.  Cardiovascular: Normal rate and regular rhythm.  Exam reveals no gallop and no friction rub.   No murmur heard. Pulmonary/Chest: No stridor. She has no wheezes. She has no rales. She exhibits no tenderness.  Abdominal: She exhibits no distension. There is tenderness. There is no rebound.  Mild left lower quadrant tender  Musculoskeletal: Normal range of motion. She exhibits no edema.  Lymphadenopathy:    She has no cervical adenopathy.  Neurological: She is oriented to person, place, and time. She exhibits normal muscle tone. Coordination normal.  Skin: No rash noted. No erythema.  Psychiatric: She has a normal mood  and affect. Her behavior is normal.     ED Treatments / Results  Labs (all labs ordered are listed, but only abnormal results are displayed) Labs Reviewed  URINALYSIS, ROUTINE W REFLEX MICROSCOPIC - Abnormal; Notable for the following:       Result Value   Color, Urine STRAW (*)    APPearance HAZY (*)    Leukocytes, UA LARGE (*)    Bacteria, UA RARE (*)    Squamous Epithelial / LPF 0-5 (*)    Non Squamous Epithelial 0-5 (*)    Crystals PRESENT (*)    All other components within normal limits  CBC WITH DIFFERENTIAL/PLATELET - Abnormal; Notable for the following:    RDW 15.8 (*)    All other components within normal limits  COMPREHENSIVE METABOLIC  PANEL - Abnormal; Notable for the following:    Creatinine, Ser 1.11 (*)    ALT 10 (*)    GFR calc non Af Amer 50 (*)    GFR calc Af Amer 58 (*)    All other components within normal limits  URINE CULTURE    EKG  EKG Interpretation None       Radiology Ct Renal Stone Study  Result Date: 01/18/2017 CLINICAL DATA:  Urinary pressure, frequency EXAM: CT ABDOMEN AND PELVIS WITHOUT CONTRAST TECHNIQUE: Multidetector CT imaging of the abdomen and pelvis was performed following the standard protocol without IV contrast. COMPARISON:  08/29/2005 FINDINGS: Lower chest: Cardiomegaly. Scarring in the lung bases. No effusions. Hepatobiliary: Layering gallstones within the gallbladder. No focal hepatic abnormality. Pancreas: Pancreatic atrophy. No focal abnormality or ductal dilatation. Spleen: No focal abnormality.  Normal size. Adrenals/Urinary Tract: Bilateral renal cysts, some which are hyperdense and likely reflect hemorrhagic or proteinaceous cysts. No hydronephrosis or stones. Urinary bladder unremarkable. Stomach/Bowel: Normal appendix. Stomach, large and small bowel grossly unremarkable. Vascular/Lymphatic: Diffuse aortic and iliac calcifications. No adenopathy. Reproductive: Uterus and adnexa unremarkable.  No mass. Other: No free fluid or  free air. Musculoskeletal: Postoperative changes in the lumbar spine. No acute bony abnormality. IMPRESSION: Bilateral renal hypodense and hyperdense cysts, no hydronephrosis or stones. Cholelithiasis. Aortoiliac atherosclerosis. Electronically Signed   By: Charlett NoseKevin  Dover M.D.   On: 01/18/2017 09:40    Procedures Procedures (including critical care time)  Medications Ordered in ED Medications - No data to display   Initial Impression / Assessment and Plan / ED Course  I have reviewed the triage vital signs and the nursing notes.  Pertinent labs & imaging results that were available during my care of the patient were reviewed by me and considered in my medical decision making (see chart for details).     Labs including urinalysis unremarkable. CT abdomen unremarkable. Patient given some Ultram and told to follow back up with her urologist. She also had a urine culture done  Final Clinical Impressions(s) / ED Diagnoses   Final diagnoses:  Urinary frequency    New Prescriptions New Prescriptions   TRAMADOL (ULTRAM) 50 MG TABLET    Take 1 tablet (50 mg total) by mouth every 6 (six) hours as needed.     Bethann BerkshireZammit, Cordale Manera, MD 01/18/17 1045

## 2017-01-18 NOTE — ED Triage Notes (Signed)
Pt reports urinary pressure, frequency, and burning for over a month progressively getting worse.  Sees urologist for same and is tx with Toviaz.

## 2017-01-18 NOTE — Discharge Instructions (Signed)
Follow-up with your urologist in 1-2 weeks °

## 2017-01-19 LAB — URINE CULTURE

## 2017-01-22 ENCOUNTER — Other Ambulatory Visit (HOSPITAL_COMMUNITY)
Admission: AD | Admit: 2017-01-22 | Discharge: 2017-01-22 | Disposition: A | Discharge status: Home / Self Care | Payer: Medicare Other | Source: Skilled Nursing Facility | Attending: Urology | Admitting: Urology

## 2017-01-22 ENCOUNTER — Ambulatory Visit (INDEPENDENT_AMBULATORY_CARE_PROVIDER_SITE_OTHER): Payer: Medicare Other | Admitting: Urology

## 2017-01-22 DIAGNOSIS — R109 Unspecified abdominal pain: Secondary | ICD-10-CM

## 2017-01-22 DIAGNOSIS — N3281 Overactive bladder: Secondary | ICD-10-CM | POA: Diagnosis not present

## 2017-01-22 DIAGNOSIS — N3941 Urge incontinence: Secondary | ICD-10-CM

## 2017-01-24 LAB — URINE CULTURE: Culture: NO GROWTH

## 2017-02-11 DIAGNOSIS — H9201 Otalgia, right ear: Secondary | ICD-10-CM

## 2017-02-11 DIAGNOSIS — H6121 Impacted cerumen, right ear: Secondary | ICD-10-CM | POA: Insufficient documentation

## 2017-02-11 DIAGNOSIS — H903 Sensorineural hearing loss, bilateral: Secondary | ICD-10-CM | POA: Insufficient documentation

## 2017-02-11 HISTORY — DX: Otalgia, right ear: H92.01

## 2018-05-27 ENCOUNTER — Ambulatory Visit: Payer: Medicare Other | Admitting: Urology

## 2018-07-03 ENCOUNTER — Other Ambulatory Visit (HOSPITAL_COMMUNITY): Payer: Self-pay | Admitting: Pulmonary Disease

## 2018-07-03 DIAGNOSIS — Z780 Asymptomatic menopausal state: Secondary | ICD-10-CM

## 2018-07-03 DIAGNOSIS — Z1231 Encounter for screening mammogram for malignant neoplasm of breast: Secondary | ICD-10-CM

## 2018-07-03 LAB — COMPREHENSIVE METABOLIC PANEL
Albumin: 4.2 (ref 3.5–5.0)
Calcium: 9.3 (ref 8.7–10.7)
GFR calc Af Amer: 47
GFR calc non Af Amer: 40
Globulin: 2.9

## 2018-07-03 LAB — CBC: RBC: 4.91 (ref 3.87–5.11)

## 2018-07-03 LAB — HEPATIC FUNCTION PANEL
ALT: 14 (ref 7–35)
AST: 21 (ref 13–35)
Alkaline Phosphatase: 52 (ref 25–125)
Bilirubin, Total: 0.5

## 2018-07-03 LAB — BASIC METABOLIC PANEL
BUN: 14 (ref 4–21)
CO2: 104 — AB (ref 13–22)
Chloride: 104 (ref 99–108)
Creatinine: 1.3 — AB (ref <=1.1)
Glucose: 148
Potassium: 4.1 (ref 3.4–5.3)
Sodium: 139 (ref 137–147)

## 2018-07-03 LAB — CBC AND DIFFERENTIAL
HCT: 42 (ref 36–46)
Hemoglobin: 13.5 (ref 12.0–16.0)
Neutrophils Absolute: 3825
Platelets: 233 (ref 150–399)
WBC: 6.1

## 2018-07-03 LAB — TSH: TSH: 3.19 (ref <=5.90)

## 2018-07-03 LAB — HEMOGLOBIN A1C: Hemoglobin A1C: 6

## 2018-07-04 LAB — LIPID PANEL
Cholesterol: 188 (ref 0–200)
HDL: 61 (ref 35–70)
Triglycerides: 159 (ref 40–160)

## 2018-07-13 LAB — LIPID PANEL: LDL Cholesterol: 101

## 2018-07-16 ENCOUNTER — Ambulatory Visit (HOSPITAL_COMMUNITY)
Admission: RE | Admit: 2018-07-16 | Discharge: 2018-07-16 | Disposition: A | Discharge status: Home / Self Care | Payer: Medicare Other | Source: Ambulatory Visit | Attending: Pulmonary Disease | Admitting: Pulmonary Disease

## 2018-07-16 DIAGNOSIS — Z780 Asymptomatic menopausal state: Secondary | ICD-10-CM

## 2018-07-16 DIAGNOSIS — M85832 Other specified disorders of bone density and structure, left forearm: Secondary | ICD-10-CM | POA: Insufficient documentation

## 2018-07-16 DIAGNOSIS — Z1231 Encounter for screening mammogram for malignant neoplasm of breast: Secondary | ICD-10-CM | POA: Diagnosis present

## 2018-07-16 DIAGNOSIS — M85852 Other specified disorders of bone density and structure, left thigh: Secondary | ICD-10-CM | POA: Diagnosis not present

## 2018-07-16 LAB — HM DEXA SCAN

## 2018-07-17 LAB — HM MAMMOGRAPHY

## 2018-09-02 ENCOUNTER — Ambulatory Visit: Payer: Medicare Other | Admitting: Urology

## 2019-10-26 ENCOUNTER — Ambulatory Visit: Payer: Medicare Other | Admitting: Family Medicine

## 2019-10-27 ENCOUNTER — Ambulatory Visit: Payer: Self-pay | Attending: Internal Medicine

## 2019-10-27 DIAGNOSIS — Z23 Encounter for immunization: Secondary | ICD-10-CM | POA: Insufficient documentation

## 2019-10-27 NOTE — Progress Notes (Signed)
COVID-19 Vaccine Information can be found at: [email or web address] For questions related to vaccine distribution or appointments, please email [email or web address] or call [phone].    

## 2019-10-27 NOTE — Progress Notes (Signed)
   Covid-19 Vaccination Clinic  Name:  SHENEE WIGNALL    MRN: [id number] DOB: 28-Apr-1946  10/27/2019  Ms. Heney was observed post Covid-19 immunization for 15 minutes without incident. She was provided with Vaccine Information Sheet and instruction to access the V-Safe system.   Ms. Oswald was instructed to call 911 with any severe reactions post vaccine: Marland Kitchen Difficulty breathing  . Swelling of face and throat  . A fast heartbeat  . A bad rash all over body  . Dizziness and weakness   Immunizations Administered    Name Date Dose VIS Date Route   Moderna COVID-19 Vaccine 10/27/2019  4:31 PM 0.5 mL 07/28/2019 Intramuscular   Manufacturer: Moderna   Lot: [id number]   NDC: 86825-749-35

## 2019-10-31 DIAGNOSIS — G894 Chronic pain syndrome: Secondary | ICD-10-CM | POA: Insufficient documentation

## 2019-10-31 DIAGNOSIS — M130 Polyarthritis, unspecified: Secondary | ICD-10-CM | POA: Insufficient documentation

## 2019-10-31 DIAGNOSIS — M5416 Radiculopathy, lumbar region: Secondary | ICD-10-CM | POA: Insufficient documentation

## 2019-10-31 DIAGNOSIS — E1142 Type 2 diabetes mellitus with diabetic polyneuropathy: Secondary | ICD-10-CM | POA: Insufficient documentation

## 2019-11-19 ENCOUNTER — Other Ambulatory Visit: Payer: Self-pay | Admitting: *Deleted

## 2019-11-19 DIAGNOSIS — R002 Palpitations: Secondary | ICD-10-CM

## 2019-11-19 NOTE — Progress Notes (Signed)
Request from Dr. Ronal Fear office for event monitor. Order placed. Called pt to notify no answer. Left msg to call back.

## 2019-11-24 ENCOUNTER — Ambulatory Visit: Payer: Self-pay | Attending: Internal Medicine

## 2019-11-24 DIAGNOSIS — Z23 Encounter for immunization: Secondary | ICD-10-CM

## 2019-11-24 NOTE — Progress Notes (Signed)
   Covid-19 Vaccination Clinic  Name:  KISHANA BATTEY    MRN: [id number] DOB: 1946-03-25  11/24/2019  Ms. Plata was observed post Covid-19 immunization for 15 minutes without incident. She was provided with Vaccine Information Sheet and instruction to access the V-Safe system.   Ms. Pagan was instructed to call 911 with any severe reactions post vaccine: Marland Kitchen Difficulty breathing  . Swelling of face and throat  . A fast heartbeat  . A bad rash all over body  . Dizziness and weakness   Immunizations Administered    Name Date Dose VIS Date Route   Moderna COVID-19 Vaccine 11/24/2019  3:07 PM 0.5 mL 07/28/2019 Intramuscular   Manufacturer: Moderna   Lot: [id number]   NDC: 72550-016-42

## 2019-11-30 ENCOUNTER — Other Ambulatory Visit: Payer: Self-pay | Admitting: Family Medicine

## 2019-12-02 ENCOUNTER — Encounter (INDEPENDENT_AMBULATORY_CARE_PROVIDER_SITE_OTHER): Payer: Self-pay

## 2019-12-02 ENCOUNTER — Ambulatory Visit (INDEPENDENT_AMBULATORY_CARE_PROVIDER_SITE_OTHER): Payer: Medicare PPO | Admitting: Family Medicine

## 2019-12-02 ENCOUNTER — Other Ambulatory Visit: Payer: Self-pay

## 2019-12-02 ENCOUNTER — Encounter: Payer: Self-pay | Admitting: Family Medicine

## 2019-12-02 VITALS — BP 124/84 | HR 101 | Temp 97.1°F | Resp 15 | Ht 68.0 in | Wt 204.0 lb

## 2019-12-02 DIAGNOSIS — M549 Dorsalgia, unspecified: Secondary | ICD-10-CM

## 2019-12-02 DIAGNOSIS — D869 Sarcoidosis, unspecified: Secondary | ICD-10-CM

## 2019-12-02 DIAGNOSIS — I10 Essential (primary) hypertension: Secondary | ICD-10-CM | POA: Diagnosis not present

## 2019-12-02 DIAGNOSIS — F321 Major depressive disorder, single episode, moderate: Secondary | ICD-10-CM | POA: Diagnosis not present

## 2019-12-02 DIAGNOSIS — G8929 Other chronic pain: Secondary | ICD-10-CM

## 2019-12-02 DIAGNOSIS — E785 Hyperlipidemia, unspecified: Secondary | ICD-10-CM | POA: Diagnosis not present

## 2019-12-02 DIAGNOSIS — E118 Type 2 diabetes mellitus with unspecified complications: Secondary | ICD-10-CM | POA: Insufficient documentation

## 2019-12-02 DIAGNOSIS — E119 Type 2 diabetes mellitus without complications: Secondary | ICD-10-CM | POA: Diagnosis not present

## 2019-12-02 NOTE — Assessment & Plan Note (Signed)
Is followed by Dr. Gerilyn Pilgrim.  Does have depression from this.  Is willing to see therapy at this time.  Advised to continue to follow Dr. Ronal Fear recommendations for treatment.

## 2019-12-02 NOTE — Assessment & Plan Note (Signed)
PHQ is 10.  Denies having any SI or HI.  Continue Lexapro at this time and start therapy.  Appreciate collaboration in her care.

## 2019-12-02 NOTE — Assessment & Plan Note (Signed)
[name] is encouraged to maintain a well balanced diet that is low in salt. Controlled, continue current medication regimen. Additionally, she is also reminded that exercise is beneficial for heart health and control of  Blood pressure. 30-60 minutes daily is recommended-walking was suggested.   

## 2019-12-02 NOTE — Progress Notes (Signed)
Subjective:  Patient ID: [name], female    DOB: 06-Jun-1946  Age: 74 y.o. MRN: [id number]  CC:  Chief Complaint  Patient presents with  . Establish Care      HPI  HPI   Mrs. Machuca is a 74 year old female patient here to establish care today was a patient of [name]. She has a significant history of arthritis, CAD, diabetes without complication, hyperlipidemia, hypertension, osteoporosis with multiple fractures, renal disorder, sarcoidosis.  She is seen by Dr. Gerilyn Pilgrim for pain management. Reports that she did a Cologuard in 2019 and it was negative.   Is up-to-date on her vaccinations.  Needs to get in with her eye doctor for her diabetic eye exam.  Sees Dr. Alden Hipp in [place] for this.  Also needs to get updated mammogram.  Reports taking all her medications without any issues or concerns.  Reports that she has trouble with her pain and it is really depressed her and caused her to have a decreased interest in life in general.  She reports that she is taking her Lexapro as directed.  Reports that she is not taking the Xanax anymore and that she abruptly stopped that and that caused her to go into hallucinations for 4 days and she was hospitalized.  She reports that she has a lot of stress with her family calling her especially her sister "dope addict".  She reports her children have also stated this to her.  She is open to talking to a therapist today and would like a referral.  She thinks her labs were drawn in the beginning of this year but she is unsure exactly what date. Is open to getting repeat labs soon.  Outside of her above concerns with the medication use for pain and her anxiety dealing with her family she has no other issues to report today.  Today patient denies signs and symptoms of COVID 19 infection including fever, chills, cough, shortness of breath, and headache. Past Medical, Surgical, Social History, Allergies, and Medications have been  Reviewed.   Past Medical History:  Diagnosis Date  . Arthritis   . Coronary artery disease   . Diabetes mellitus without complication (HCC)   . Encephalopathy 03/02/2014  . Hyperlipidemia   . Hypertension   . Osteoporosis   . Renal disorder   . Sarcoidosis     No outpatient medications have been marked as taking for the 12/02/19 encounter (Office Visit) with Freddy Finner, NP.    ROS:  Review of Systems  Constitutional: Negative.   HENT: Negative.   Eyes: Negative.   Respiratory: Negative.   Cardiovascular: Negative.   Gastrointestinal: Negative.   Genitourinary: Negative.   Musculoskeletal: Negative.        Chronic pain  Skin: Negative.   Neurological: Negative.   Endo/Heme/Allergies: Negative.   Psychiatric/Behavioral: Positive for depression. The patient is nervous/anxious.   All other systems reviewed and are negative.    Objective:   Today's Vitals: BP 124/84   Pulse (!) 101   Temp (!) 97.1 F (36.2 C)   Resp 15   Ht 5\' 8"  (1.727 m)   Wt 204 lb (92.5 kg)   SpO2 98%   BMI 31.02 kg/m  Vitals with BMI 12/02/2019 01/18/2017 01/18/2017  Height 5\' 8"  - -  Weight 204 lbs - -  BMI 31.03 - -  Systolic 124 101 01/20/2017  Diastolic 84 66 82  Pulse 101 60 70     Physical Exam Vitals  and nursing note reviewed.  Constitutional:      Appearance: Normal appearance. She is well-developed and well-groomed. She is obese.  HENT:     Head: Normocephalic and atraumatic.     Right Ear: External ear normal.     Left Ear: External ear normal.     Mouth/Throat:     Comments: Mask in place  Eyes:     General:        Right eye: No discharge.        Left eye: No discharge.     Conjunctiva/sclera: Conjunctivae normal.  Cardiovascular:     Rate and Rhythm: Normal rate and regular rhythm.     Pulses: Normal pulses.     Heart sounds: Normal heart sounds.  Pulmonary:     Effort: Pulmonary effort is normal.     Breath sounds: Normal breath sounds.  Musculoskeletal:         General: Normal range of motion.     Cervical back: Normal range of motion and neck supple.  Skin:    General: Skin is warm.  Neurological:     General: No focal deficit present.     Mental Status: She is alert and oriented to person, place, and time.  Psychiatric:        Attention and Perception: Attention normal.        Mood and Affect: Mood is depressed. Affect is tearful.        Speech: Speech normal.        Behavior: Behavior normal. Behavior is cooperative.        Thought Content: Thought content normal.        Cognition and Memory: Cognition normal.        Judgment: Judgment normal.    Depression screen PHQ 2/9 12/02/2019  Decreased Interest 2  Down, Depressed, Hopeless 3  PHQ - 2 Score 5  Altered sleeping 1  Tired, decreased energy 3  Change in appetite 1  Feeling bad or failure about yourself  0  Trouble concentrating 0  Moving slowly or fidgety/restless 0  Suicidal thoughts 0  PHQ-9 Score 10  Difficult doing work/chores Somewhat difficult     Assessment   1. Depression, major, single episode, moderate ([place])   2. Diabetes mellitus without complication ([place])   3. Hyperlipidemia, unspecified hyperlipidemia type   4. Essential hypertension   5. Sarcoidosis   6. Chronic back pain, unspecified back location, unspecified back pain laterality     Tests ordered Orders Placed This Encounter  Procedures  . CBC  . COMPLETE METABOLIC PANEL WITH GFR  . Hemoglobin A1c  . Lipid panel  . Ambulatory referral to [facility]: Please see assessment and plan per problem list above.   No orders of the defined types were placed in this encounter.   Patient to follow-up in 03/03/2020   Perlie Mayo, NP

## 2019-12-02 NOTE — Assessment & Plan Note (Signed)
Needs updated labs, continue current medication regime for now. Is on statin therapy as well.  And has controlled blood pressure.  Needs to get updated eye exam reports that she will follow-up on this.

## 2019-12-02 NOTE — Patient Instructions (Addendum)
I appreciate the opportunity to provide you with care for your health and wellness. Today we discussed: establish care  Follow up: 3 months   Labs fasting one week before next appt  Referral to Therapy made  Happy Spring :)  Please continue to practice social distancing to keep you, your family, and our community safe.  If you must go out, please wear a mask and practice good handwashing.  It was a pleasure to see you and I look forward to continuing to work together on your health and well-being. Please do not hesitate to call the office if you need care or have questions about your care.  Have a wonderful day and week. With Gratitude, Tereasa Coop, DNP, AGNP-BC

## 2019-12-02 NOTE — Assessment & Plan Note (Signed)
Continue cholesterol medicine and encourage low-fat diet getting updated labs.

## 2019-12-07 ENCOUNTER — Other Ambulatory Visit: Payer: Self-pay | Admitting: *Deleted

## 2019-12-07 MED ORDER — SIMVASTATIN 40 MG PO TABS: 40.0000 mg | ORAL_TABLET | Freq: Every day | ORAL | 5 refills | Status: DC

## 2019-12-07 MED ORDER — POTASSIUM CHLORIDE CRYS ER 20 MEQ PO TBCR: 20.0000 meq | EXTENDED_RELEASE_TABLET | Freq: Two times a day (BID) | ORAL | 3 refills | Status: DC

## 2019-12-17 ENCOUNTER — Telehealth: Payer: Self-pay

## 2019-12-17 NOTE — Telephone Encounter (Signed)
Needs virtual visit with available provider

## 2019-12-17 NOTE — Telephone Encounter (Signed)
Pt is calling with a sore throat, for a week now, and has gargled with salt water with no releif

## 2019-12-21 ENCOUNTER — Other Ambulatory Visit: Payer: Self-pay

## 2019-12-21 ENCOUNTER — Ambulatory Visit
Admission: EM | Admit: 2019-12-21 | Discharge: 2019-12-21 | Disposition: A | Discharge status: Home / Self Care | Payer: Medicare PPO | Attending: Emergency Medicine | Admitting: Emergency Medicine

## 2019-12-21 DIAGNOSIS — J0140 Acute pansinusitis, unspecified: Secondary | ICD-10-CM

## 2019-12-21 MED ORDER — DOXYCYCLINE HYCLATE 100 MG PO CAPS: 100.0000 mg | ORAL_CAPSULE | Freq: Two times a day (BID) | ORAL | 0 refills | Status: DC

## 2019-12-21 NOTE — Discharge Instructions (Signed)
Rest and push fluids Doxycycline prescribed.  Take as directed and to completion Continue with OTC ibuprofen/tylenol as needed for pain Follow up with PCP later this week or next week for recheck and to ensure symptoms are improving Return or go to the ED if you have any new or worsening symptoms such as fever, chills, worsening sinus pain/pressure, cough, sore throat, chest pain, shortness of breath, abdominal pain, changes in bowel or bladder habits, etc..Marland Kitchen

## 2019-12-21 NOTE — ED Provider Notes (Signed)
[facility]   [id number] 12/21/19 Arrival Time: 1509   CC: URI symptoms  SUBJECTIVE: History from: patient.  [name] is a 74 y.o. female who presents with HA, runny nose, congestion, RT sided facial pain/ pressure, and sore throat x 2-3 weeks.  Denies sick exposure to COVID, flu or strep.  Denies recent travel.  Has tried OTC medications without relief.  Denies aggravating factors. Reports previous symptoms in the past with sinus infection.   Denies fever, chills, fatigue, cough, SOB, wheezing, chest pain, nausea, vomiting, changes in bowel or bladder habits.    Has received both COVID vaccines  ROS: As per HPI.  All other pertinent ROS negative.     Past Medical History:  Diagnosis Date  . Arthritis   . Coronary artery disease   . Diabetes mellitus without complication (HCC)   . Encephalopathy 03/02/2014  . Hyperlipidemia   . Hypertension   . Osteoporosis   . Renal disorder   . Sarcoidosis    Past Surgical History:  Procedure Laterality Date  . ANKLE SURGERY    . BACK SURGERY    . FRACTURE SURGERY     tibia    Allergies  Allergen Reactions  . Darvon [Propoxyphene] Nausea And Vomiting  . Morphine And Related Itching and Rash  . Penicillins Itching and Rash    Pt denies this allergy   No current facility-administered medications on file prior to encounter.   Current Outpatient Medications on File Prior to Encounter  Medication Sig Dispense Refill  . alendronate (FOSAMAX) 70 MG tablet Take 70 mg by mouth once a week. Take with a full glass of water on an empty stomach.    . ALPRAZolam (XANAX) 0.5 MG tablet Take 1 tablet (0.5 mg total) by mouth every 8 (eight) hours. (Patient not taking: Reported on 12/02/2019) 90 tablet 1  . amLODipine (NORVASC) 5 MG tablet Take 5 mg by mouth daily.    Marland Kitchen darifenacin (ENABLEX) 15 MG 24 hr tablet Take 15 mg by mouth daily.    Marland Kitchen escitalopram (LEXAPRO) 20 MG tablet Take 20 mg by mouth at bedtime.     . fentaNYL (DURAGESIC)  25 MCG/HR patch Place 1 patch (25 mcg total) onto the skin every 3 (three) days. 5 patch 0  . furosemide (LASIX) 40 MG tablet Take 40 mg by mouth 2 (two) times daily.    Marland Kitchen glipiZIDE (GLUCOTROL XL) 5 MG 24 hr tablet Take 5 mg by mouth daily with breakfast.    . hydroxychloroquine (PLAQUENIL) 200 MG tablet Take 200 mg by mouth daily.    Marland Kitchen lisinopril (PRINIVIL,ZESTRIL) 2.5 MG tablet Take 2.5 mg by mouth daily.    . methocarbamol (ROBAXIN) 500 MG tablet Take 500 mg by mouth every 6 (six) hours as needed for muscle spasms.    Marland Kitchen oxycodone (OXY-IR) 5 MG capsule Take 1 capsule (5 mg total) by mouth 4 (four) times daily as needed for pain. 30 capsule 0  . pantoprazole (PROTONIX) 40 MG tablet Take 40 mg by mouth 2 (two) times daily.    . potassium chloride SA (KLOR-CON) 20 MEQ tablet Take 1 tablet (20 mEq total) by mouth 2 (two) times daily. 90 tablet 3  . pregabalin (LYRICA) 50 MG capsule Take 1 capsule (50 mg total) by mouth at bedtime. 30 capsule 1  . simvastatin (ZOCOR) 40 MG tablet Take 1 tablet (40 mg total) by mouth at bedtime. 30 tablet 5  . sodium bicarbonate 650 MG tablet Take 650 mg  by mouth 2 (two) times daily.    . traMADol (ULTRAM) 50 MG tablet Take 1 tablet (50 mg total) by mouth every 6 (six) hours as needed. 15 tablet 0  . Vitamin D, Ergocalciferol, (DRISDOL) 50000 UNITS CAPS capsule Take 50,000 Units by mouth every 7 (seven) days. *take once weekly on the same day each week*     Social History   Socioeconomic History  . Marital status: Married    Spouse name: Edd Arbour   . Number of children: 3  . Years of education: Not on file  . Highest education level: Not on file  Occupational History  . Not on file  Tobacco Use  . Smoking status: Never Smoker  . Smokeless tobacco: Never Used  Substance and Sexual Activity  . Alcohol use: No  . Drug use: No  . Sexual activity: Not Currently  Other Topics Concern  . 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 Difficulty of Paying Living Expenses: Not hard at all  Food Insecurity: No Food Insecurity  . Worried About Charity fundraiser in the Last Year: Never true  . Ran Out of Food in the Last Year: Never true  Transportation Needs: No Transportation Needs  . Lack of Transportation (Medical): No  . Lack of Transportation (Non-Medical): No  Physical Activity: Inactive  . Days of Exercise per Week: 0 days  . Minutes of Exercise per Session: 0 min  Stress: No Stress Concern Present  . Feeling of Stress : Not at all  Social Connections: Somewhat Isolated  . Frequency of Communication with Friends and Family: Twice a week  . Frequency of Social Gatherings with Friends and Family: Once a week  . Attends Religious Services: Never  . Active Member of Clubs or Organizations: No  . Attends Archivist Meetings: Never  . Marital Status: Married  Human resources officer Violence: Not At Risk  . Fear of Current or Ex-Partner: No  . Emotionally Abused: No  . Physically Abused: No  . Sexually Abused: No   Family History  Problem Relation Age of Onset  . Hypertension Mother   . Hyperlipidemia Mother   . Throat cancer Father     OBJECTIVE:  Vitals:   12/21/19 1516  BP: 120/85  Pulse: 76  Resp: 17  Temp: 99.1 F (37.3 C)  TempSrc: Oral  SpO2: 94%     General appearance: alert; appears mildly fatigued, but nontoxic; speaking in full sentences and tolerating own secretions HEENT: NCAT; Ears: EACs clear, TMs pearly gray; Eyes: PERRL.  EOM grossly intact. Sinuses: mildly TTP over RT  maxillary/ frontal sinuses; Nose: nares patent without rhinorrhea, Throat: oropharynx clear, tonsils non erythematous or enlarged, uvula midline  Neck: supple without LAD Lungs: unlabored respirations, symmetrical air entry; cough: absent; no respiratory distress; CTAB Heart: regular rate and rhythm.   Skin: warm and dry Psychological: alert and  cooperative; normal mood and affect  ASSESSMENT & PLAN:  1. Acute non-recurrent pansinusitis     Meds ordered this encounter  Medications  . doxycycline (VIBRAMYCIN) 100 MG capsule    Sig: Take 1 capsule (100 mg total) by mouth 2 (two) times daily.    Dispense:  20 capsule    Refill:  0    Order Specific Question:   Supervising Provider    Answer:   Eustace Moore [[id number]]   Rest and push fluids Doxycycline prescribed.  Take as directed and to completion Continue with OTC ibuprofen/tylenol as needed for pain Follow up with PCP later this week or next week for recheck and to ensure symptoms are improving Return or go to the ED if you have any new or worsening symptoms such as fever, chills, worsening sinus pain/pressure, cough, sore throat, chest pain, shortness of breath, abdominal pain, changes in bowel or bladder habits, etc...   Reviewed expectations re: course of current medical issues. Questions answered. Outlined signs and symptoms indicating need for more acute intervention. Patient verbalized understanding. After Visit Summary given.         Rennis Harding, PA-C 12/21/19 1548

## 2019-12-21 NOTE — ED Triage Notes (Signed)
Sore throat for past 3 weeks

## 2019-12-21 NOTE — Telephone Encounter (Signed)
Pt is not feeling any better, head is hurting and lymph nodes are swolling, advised pt to go to the Urgent Care on [address]

## 2019-12-29 ENCOUNTER — Other Ambulatory Visit: Payer: Self-pay

## 2019-12-29 ENCOUNTER — Ambulatory Visit (INDEPENDENT_AMBULATORY_CARE_PROVIDER_SITE_OTHER): Payer: Medicare PPO

## 2019-12-29 DIAGNOSIS — R002 Palpitations: Secondary | ICD-10-CM | POA: Diagnosis not present

## 2019-12-30 ENCOUNTER — Other Ambulatory Visit: Payer: Self-pay | Admitting: *Deleted

## 2019-12-30 ENCOUNTER — Telehealth: Payer: Self-pay

## 2019-12-30 MED ORDER — ALENDRONATE SODIUM 70 MG PO TABS: 70.0000 mg | ORAL_TABLET | ORAL | 1 refills | Status: DC

## 2019-12-30 MED ORDER — VITAMIN D (ERGOCALCIFEROL) 1.25 MG (50000 UNIT) PO CAPS: 50000.0000 [IU] | ORAL_CAPSULE | ORAL | 1 refills | Status: DC

## 2019-12-30 MED ORDER — HYDROXYCHLOROQUINE SULFATE 200 MG PO TABS: 200.0000 mg | ORAL_TABLET | Freq: Every day | ORAL | 3 refills | Status: DC

## 2019-12-30 NOTE — Telephone Encounter (Signed)
Pt is calling stating that the Antibiotics is not working and neither is the the motrin.. please call

## 2019-12-31 ENCOUNTER — Telehealth (INDEPENDENT_AMBULATORY_CARE_PROVIDER_SITE_OTHER): Payer: Medicare PPO | Admitting: Family Medicine

## 2019-12-31 ENCOUNTER — Other Ambulatory Visit: Payer: Self-pay

## 2019-12-31 ENCOUNTER — Encounter: Payer: Self-pay | Admitting: Family Medicine

## 2019-12-31 VITALS — BP 120/85 | Ht 68.0 in | Wt 204.0 lb

## 2019-12-31 DIAGNOSIS — H65196 Other acute nonsuppurative otitis media, recurrent, bilateral: Secondary | ICD-10-CM

## 2019-12-31 DIAGNOSIS — H6690 Otitis media, unspecified, unspecified ear: Secondary | ICD-10-CM | POA: Insufficient documentation

## 2019-12-31 MED ORDER — AZITHROMYCIN 250 MG PO TABS: ORAL_TABLET | ORAL | 0 refills | Status: DC

## 2019-12-31 NOTE — Assessment & Plan Note (Signed)
Failed treatment fully with acute otitis media that was paired with pansinusitis. Has an allergy to penicillin will try Zithromax.  Completed her full course of doxycycline prior to this.  Would like to refrain from using fluoroquinolones at this time.  Encourage use of Tylenol more than Motrin secondary to declining kidney function.  Encourage additional rest, vitamin C fresh fruits and vegetables, hydration with water.   Reviewed side effects, risks and benefits of medication.   Patient acknowledged agreement and understanding of the plan.

## 2019-12-31 NOTE — Telephone Encounter (Signed)
Pt seen this am. 

## 2019-12-31 NOTE — Progress Notes (Signed)
Virtual Visit via Telephone Note   This visit type was conducted due to national recommendations for restrictions regarding the COVID-19 Pandemic (e.g. social distancing) in an effort to limit this patient's exposure and mitigate transmission in our community.  Due to her co-morbid illnesses, this patient is at least at moderate risk for complications without adequate follow up.  This format is felt to be most appropriate for this patient at this time.  The patient did not have access to video technology/had technical difficulties with video requiring transitioning to audio format only (telephone).  All issues noted in this document were discussed and addressed.  No physical exam could be performed with this format.    Evaluation Performed:  Follow-up visit  Date:  12/31/2019   ID:  [name], DOB [birth year], MRN [id number]  Patient Location: Home Provider Location: Office  Location of Patient: Home Location of Provider: Telehealth Consent was obtain for visit to be over via telehealth. I verified that I am speaking with the correct person using two identifiers.  PCP:  Freddy Finner, NP   Chief Complaint:  Sore throat  History of Present Illness:    [name] is a 74 y.o. female with recent history of acute nonrecurrent pansinusitis.  Went to urgent care on April 26 with headache, runny nose, congestion, right-sided facial pain and pressure and sore throat for 2 to 3 weeks.  She denied having any sick exposure or Covid exposure, flu or strep.  Denied any recent travel at that time.  Had tried over-the-counter medications without relief.  Denied any aggravating factors at that time.  Reported previous symptoms in the past with sinus infection.  Denies had any fevers, chills, cough, shortness of breath, wheezing, chest pain, nausea, vomiting, changes in bowel or bladder habits.  Had received both Covid vaccines.  Today she reports that she completed her antibiotics but has not  gotten any better with her ear pain or sore throat.  She reports that all of her sinus congestion and facial pain and headache have been relieved.  She reports both ears are aggravating her and are uncomfortable.  She has tried taking Motrin and Tylenol without relief of her sore throat.  She additionally wants me to know that her mother passed away over the weekend  and that things are doing better at home for her. She is trying to take better care of herself.  The patient does not have symptoms concerning for COVID-19 infection (fever, chills, cough, or new shortness of breath).   Past Medical, Surgical, Social History, Allergies, and Medications have been Reviewed.  Past Medical History:  Diagnosis Date  . Arthritis   . Coronary artery disease   . Diabetes mellitus without complication (HCC)   . Encephalopathy 03/02/2014  . Hyperlipidemia   . Hypertension   . Osteoporosis   . Renal disorder   . Sarcoidosis    Past Surgical History:  Procedure Laterality Date  . ANKLE SURGERY    . BACK SURGERY    . FRACTURE SURGERY     tibia      Current Meds  Medication Sig  . alendronate (FOSAMAX) 70 MG tablet Take 1 tablet (70 mg total) by mouth once a week. Take with a full glass of water on an empty stomach.  . ALPRAZolam (XANAX) 0.5 MG tablet Take 1 tablet (0.5 mg total) by mouth every 8 (eight) hours.  Marland Kitchen amLODipine (NORVASC) 5 MG tablet Take 5 mg by mouth daily.  Marland Kitchen  darifenacin (ENABLEX) 15 MG 24 hr tablet Take 15 mg by mouth daily.  Marland Kitchen escitalopram (LEXAPRO) 20 MG tablet Take 20 mg by mouth at bedtime.   . fentaNYL (DURAGESIC) 25 MCG/HR patch Place 1 patch (25 mcg total) onto the skin every 3 (three) days.  . furosemide (LASIX) 40 MG tablet Take 40 mg by mouth 2 (two) times daily.  Marland Kitchen glipiZIDE (GLUCOTROL XL) 5 MG 24 hr tablet Take 5 mg by mouth daily with breakfast.  . hydroxychloroquine (PLAQUENIL) 200 MG tablet Take 1 tablet (200 mg total) by mouth daily.  Marland Kitchen lisinopril  (PRINIVIL,ZESTRIL) 2.5 MG tablet Take 2.5 mg by mouth daily.  . methocarbamol (ROBAXIN) 500 MG tablet Take 500 mg by mouth every 6 (six) hours as needed for muscle spasms.  Marland Kitchen oxycodone (OXY-IR) 5 MG capsule Take 1 capsule (5 mg total) by mouth 4 (four) times daily as needed for pain.  . pantoprazole (PROTONIX) 40 MG tablet Take 40 mg by mouth 2 (two) times daily.  . potassium chloride SA (KLOR-CON) 20 MEQ tablet Take 1 tablet (20 mEq total) by mouth 2 (two) times daily.  . pregabalin (LYRICA) 50 MG capsule Take 1 capsule (50 mg total) by mouth at bedtime.  . simvastatin (ZOCOR) 40 MG tablet Take 1 tablet (40 mg total) by mouth at bedtime.  . sodium bicarbonate 650 MG tablet Take 650 mg by mouth 2 (two) times daily.  . traMADol (ULTRAM) 50 MG tablet Take 1 tablet (50 mg total) by mouth every 6 (six) hours as needed.  . Vitamin D, Ergocalciferol, (DRISDOL) 1.25 MG (50000 UNIT) CAPS capsule Take 1 capsule (50,000 Units total) by mouth every 7 (seven) days. *take once weekly on the same day each week*  . [DISCONTINUED] doxycycline (VIBRAMYCIN) 100 MG capsule Take 1 capsule (100 mg total) by mouth 2 (two) times daily.     Allergies:   Darvon [propoxyphene], Morphine and related, and Penicillins   ROS:   Please see the history of present illness.    All other systems reviewed and are negative.   Labs/Other Tests and Data Reviewed:    Recent Labs: No results found for requested labs within last 8760 hours.   Recent Lipid Panel Lab Results  Component Value Date/Time   CHOL 188 07/03/2018 12:00 AM   TRIG 159 07/03/2018 12:00 AM   HDL 61 07/03/2018 12:00 AM   LDLCALC 101 07/13/2018 12:00 AM    Wt Readings from Last 3 Encounters:  12/31/19 204 lb (92.5 kg)  12/02/19 204 lb (92.5 kg)  01/18/17 220 lb (99.8 kg)     Objective:    Vital Signs:  BP 120/85   Ht 5\' 8"  (1.727 m)   Wt 204 lb (92.5 kg)   BMI 31.02 kg/m    VITAL SIGNS:  reviewed GEN:  alert and oriented RESPIRATORY:  no  shortness of breath in conversation  PSYCH:  normal affect   ASSESSMENT & PLAN:    1. Other recurrent acute nonsuppurative otitis media of both ears  - azithromycin (ZITHROMAX) 250 MG tablet; 500 mg by mouth on day 1, then 250 mg by mouth on days 2-5.  Dispense: 6 each; Refill: 0   Time:   Today, I have spent 10 minutes with the patient with telehealth technology discussing the above problems.     Medication Adjustments/Labs and Tests Ordered: Current medicines are reviewed at length with the patient today.  Concerns regarding medicines are outlined above.   Tests Ordered: No orders of the  defined types were placed in this encounter.   Medication Changes: No orders of the defined types were placed in this encounter.   Disposition:  Follow up 03/03/2020 Signed, Freddy Finner, NP  12/31/2019 9:18 AM     [facility]

## 2019-12-31 NOTE — Patient Instructions (Addendum)
I appreciate the opportunity to provide you with care for your health and wellness. Today we discussed: ear infection  Follow up: as scheduled  No labs or referrals today  Please pick up and take Zithromax as directed.  If you have any issues or concerns or do not feel better after completing this medication please let us know.  I encourage you to get plenty of rest, hydration with water, eat a well-balanced diet boosting your vitamin C intake with fresh fruits and vegetables.  Please continue to practice social distancing to keep you, your family, and our community safe.  If you must go out, please wear a mask and practice good handwashing.  It was a pleasure to see you and I look forward to continuing to work together on your health and well-being. Please do not hesitate to call the office if you need care or have questions about your care.  Have a wonderful day and week. With Gratitude, Tereasa Coop, DNP, AGNP-BC

## 2020-01-06 ENCOUNTER — Telehealth: Payer: Self-pay | Admitting: *Deleted

## 2020-01-06 NOTE — Telephone Encounter (Signed)
She needs to go to Urgent Care to see if she has strep or something else going on. Warm salt water gargles can some ties help as well, but if not UC is best due to no open appts.

## 2020-01-06 NOTE — Telephone Encounter (Signed)
Pt called she had an appt on 12-31-19. She is still no better and the tylenol is not helping her throat wanted to know what she could do next?

## 2020-01-06 NOTE — Telephone Encounter (Signed)
Pt advised to go to urgent care with verbal  understanding

## 2020-01-07 ENCOUNTER — Ambulatory Visit
Admission: EM | Admit: 2020-01-07 | Discharge: 2020-01-07 | Disposition: A | Discharge status: Home / Self Care | Payer: Medicare PPO | Attending: Emergency Medicine | Admitting: Emergency Medicine

## 2020-01-07 ENCOUNTER — Other Ambulatory Visit: Payer: Self-pay

## 2020-01-07 DIAGNOSIS — J029 Acute pharyngitis, unspecified: Secondary | ICD-10-CM | POA: Diagnosis not present

## 2020-01-07 DIAGNOSIS — H6591 Unspecified nonsuppurative otitis media, right ear: Secondary | ICD-10-CM | POA: Insufficient documentation

## 2020-01-07 LAB — POCT RAPID STREP A (OFFICE): Rapid Strep A Screen: NEGATIVE

## 2020-01-07 MED ORDER — FLUTICASONE PROPIONATE 50 MCG/ACT NA SUSP
1.0000 | Freq: Every day | NASAL | 0 refills | Status: DC
Start: 2020-01-07 — End: 2020-03-03

## 2020-01-07 MED ORDER — FLUCONAZOLE 150 MG PO TABS
150.0000 mg | ORAL_TABLET | Freq: Every day | ORAL | 0 refills | Status: DC
Start: 2020-01-07 — End: 2020-03-03

## 2020-01-07 MED ORDER — MENTHOL 3 MG MT LOZG: 1.0000 | LOZENGE | OROMUCOSAL | 1 refills | Status: DC | PRN

## 2020-01-07 NOTE — Discharge Instructions (Signed)
POC strep test was negative Cepacol lozenges was prescribed for symptomatic relief She was advised to continue to use salt and warm water to gargle Flonase was prescribed for middle ear effusion/use as directed Diflucan was prescribed for yeast infection Follow-up with PCP Return or go to ED for reevaluation or worsening symptoms

## 2020-01-07 NOTE — ED Provider Notes (Signed)
[facility]    CSN: [id number] Arrival date & time: 01/07/20  1500      History   Chief Complaint Chief Complaint  Patient presents with   Sore Throat    HPI [name] is a 74 y.o. female.   Who presented to the urgent care with a complaint of right-sided sore throat for more than 1 month.  Was previously seen on 4/26 and was prescribed doxycycline as she was allergic to penicillin.  Was also seen on 5/6 by her PCP and was prescribed azithromycin.  Patient reports medication has not been effective as she is still complaining of sore throat and right ear pain.  Denies any exposure to Covid, flu or strep.  Denies recent travel.  Denies aggravating or alleviating factors.  Denies chills, fever, fatigue, cough shortness of breath, wheezing, chest pain, nausea, vomiting, change in bowel or bladder changes  The history is provided by the patient.  Sore Throat    Past Medical History:  Diagnosis Date   Arthritis    Coronary artery disease    Diabetes mellitus without complication (HCC)    Encephalopathy 03/02/2014   Hyperlipidemia    Hypertension    Osteoporosis    Renal disorder    Sarcoidosis     Patient Active Problem List   Diagnosis Date Noted   Otitis media 12/31/2019   Depression, major, single episode, moderate (HCC) 12/02/2019   Diabetes mellitus without complication (HCC) 12/02/2019   Hyperlipidemia 12/02/2019   Essential hypertension 12/02/2019   Sarcoidosis 03/02/2014   Chronic back pain 03/02/2014    Past Surgical History:  Procedure Laterality Date   ANKLE SURGERY     BACK SURGERY     FRACTURE SURGERY     tibia     OB History   No obstetric history on file.      Home Medications    Prior to Admission medications   Medication Sig Start Date End Date Taking? Authorizing Provider  alendronate (FOSAMAX) 70 MG tablet Take 1 tablet (70 mg total) by mouth once a week. Take with a full glass of water on an empty  stomach. 12/30/19   Freddy Finner, NP  ALPRAZolam Prudy Feeler) 0.5 MG tablet Take 1 tablet (0.5 mg total) by mouth every 8 (eight) hours. 03/05/14   Kari Baars, MD  amLODipine (NORVASC) 5 MG tablet Take 5 mg by mouth daily.    [provider]  azithromycin (ZITHROMAX) 250 MG tablet 500 mg by mouth on day 1, then 250 mg by mouth on days 2-5. 12/31/19   Freddy Finner, NP  darifenacin (ENABLEX) 15 MG 24 hr tablet Take 15 mg by mouth daily.    [provider]  escitalopram (LEXAPRO) 20 MG tablet Take 20 mg by mouth at bedtime.     [provider]  fentaNYL (DURAGESIC) 25 MCG/HR patch Place 1 patch (25 mcg total) onto the skin every 3 (three) days. 03/05/14   Kari Baars, MD  fluconazole (DIFLUCAN) 150 MG tablet Take 1 tablet (150 mg total) by mouth daily. Take the second dose 72 hours after the first dose if symptom does not resolve 01/07/20   Jalonda Antigua, Zachery Dakins, FNP  fluticasone (FLONASE) 50 MCG/ACT nasal spray Place 1 spray into both nostrils daily for 14 days. 01/07/20 01/21/20  Randy Whitener, Zachery Dakins, FNP  furosemide (LASIX) 40 MG tablet Take 40 mg by mouth 2 (two) times daily.    [provider]  glipiZIDE (GLUCOTROL XL) 5 MG 24  hr tablet Take 5 mg by mouth daily with breakfast.    [provider]  hydroxychloroquine (PLAQUENIL) 200 MG tablet Take 1 tablet (200 mg total) by mouth daily. 12/30/19   Freddy Finner, NP  lisinopril (PRINIVIL,ZESTRIL) 2.5 MG tablet Take 2.5 mg by mouth daily.    [provider]  menthol-cetylpyridinium (CEPACOL) 3 MG lozenge Take 1 lozenge (3 mg total) by mouth as needed for sore throat. 01/07/20   Telina Kleckley, Zachery Dakins, FNP  methocarbamol (ROBAXIN) 500 MG tablet Take 500 mg by mouth every 6 (six) hours as needed for muscle spasms.    [provider]  oxycodone (OXY-IR) 5 MG capsule Take 1 capsule (5 mg total) by mouth 4 (four) times daily as needed for pain. 03/05/14   Kari Baars, MD  pantoprazole (PROTONIX) 40 MG  tablet Take 40 mg by mouth 2 (two) times daily.    [provider]  potassium chloride SA (KLOR-CON) 20 MEQ tablet Take 1 tablet (20 mEq total) by mouth 2 (two) times daily. 12/07/19   Freddy Finner, NP  pregabalin (LYRICA) 50 MG capsule Take 1 capsule (50 mg total) by mouth at bedtime. 03/05/14   Kari Baars, MD  simvastatin (ZOCOR) 40 MG tablet Take 1 tablet (40 mg total) by mouth at bedtime. 12/07/19   Freddy Finner, NP  sodium bicarbonate 650 MG tablet Take 650 mg by mouth 2 (two) times daily.    [provider]  traMADol (ULTRAM) 50 MG tablet Take 1 tablet (50 mg total) by mouth every 6 (six) hours as needed. 01/18/17   Bethann Berkshire, MD  Vitamin D, Ergocalciferol, (DRISDOL) 1.25 MG (50000 UNIT) CAPS capsule Take 1 capsule (50,000 Units total) by mouth every 7 (seven) days. *take once weekly on the same day each week* 12/30/19   Freddy Finner, NP    Family History Family History  Problem Relation Age of Onset   Hypertension Mother    Hyperlipidemia Mother    Throat cancer Father     Social History Social History   Tobacco Use   Smoking status: Never Smoker   Smokeless tobacco: Never Used  Substance Use Topics   Alcohol use: No   Drug use: No     Allergies   Darvon [propoxyphene], Morphine and related, and Penicillins   Review of Systems Review of Systems  Constitutional: Negative.   HENT: Positive for ear pain and sore throat.   Respiratory: Negative.   Cardiovascular: Negative.   Gastrointestinal: Negative.   Neurological: Negative.   All other systems reviewed and are negative.    Physical Exam Triage Vital Signs ED Triage Vitals  Enc Vitals Group     BP 01/07/20 1511 119/74     Pulse Rate 01/07/20 1511 84     Resp 01/07/20 1511 18     Temp 01/07/20 1511 98.9 F (37.2 C)     Temp src --      SpO2 01/07/20 1511 98 %     Weight --      Height --      Head Circumference --      Peak Flow --      Pain Score 01/07/20 1509 7       Pain Loc --      Pain Edu? --      Excl. in GC? --    No data found.  Updated Vital Signs BP 119/74    Pulse 84    Temp 98.9 F (37.2 C)  Resp 18    SpO2 98%   Visual Acuity Right Eye Distance:   Left Eye Distance:   Bilateral Distance:    Right Eye Near:   Left Eye Near:    Bilateral Near:     Physical Exam Vitals and nursing note reviewed.  Constitutional:      General: She is not in acute distress.    Appearance: She is well-developed and normal weight. She is not ill-appearing, toxic-appearing or diaphoretic.  HENT:     Head: Normocephalic.     Right Ear: Ear canal and external ear normal. A middle ear effusion is present. There is no impacted cerumen.     Left Ear: Tympanic membrane, ear canal and external ear normal. There is no impacted cerumen.     Mouth/Throat:     Dentition: Abnormal dentition.     Tonsils: 1+ on the right. 1+ on the left.      Comments: Broken tooth currently no abscess present Cardiovascular:     Rate and Rhythm: Normal rate and regular rhythm.     Pulses: Normal pulses.     Heart sounds: Normal heart sounds. No murmur. No friction rub. No gallop.   Pulmonary:     Effort: Pulmonary effort is normal. No respiratory distress.     Breath sounds: Normal breath sounds. No stridor. No wheezing, rhonchi or rales.  Chest:     Chest wall: No tenderness.  Neurological:     Mental Status: She is alert.      UC Treatments / Results  Labs (all labs ordered are listed, but only abnormal results are displayed) Labs Reviewed  CULTURE, GROUP A STREP [facility])  POCT RAPID STREP A (OFFICE)    EKG   Radiology No results found.  Procedures Procedures (including critical care time)  Medications Ordered in UC Medications - No data to display  Initial Impression / Assessment and Plan / UC Course  I have reviewed the triage vital signs and the nursing notes.  Pertinent labs & imaging results that were available during my care of the  patient were reviewed by me and considered in my medical decision making (see chart for details).    Patient is stable for discharge.  She has completed doxycycline and azithromycin while presenting with the same symptoms.  Symptoms have not not been resolved.  This suggest symptoms may be viral.  POC strep test was negative.  There is a broken tooth on the right upper gum.  Currently there is no pain but there is a possibility that this could develop into dental abscess.  Clindamycin may be the preferable antibiotic for her if she return.  Cepacol lozenges and Flonase will be prescribed respectively for sore throat and middle ear effusion.  She has developed yeast infection while taking doxycycline and azithromycin  that was prescribed previously.  Diflucan will prescribe  Final Clinical Impressions(s) / UC Diagnoses   Final diagnoses:  Sore throat  Fluid level behind tympanic membrane of right ear     Discharge Instructions     POC strep test was negative Cepacol lozenges was prescribed for symptomatic relief She was advised to continue to use salt and warm water to gargle Flonase was prescribed for middle ear effusion/use as directed Diflucan was prescribed for yeast infection Follow-up with PCP Return or go to ED for reevaluation or worsening symptoms    ED Prescriptions    Medication Sig Dispense Auth. Provider   menthol-cetylpyridinium (CEPACOL) 3 MG lozenge Take 1 lozenge (  3 mg total) by mouth as needed for sore throat. 100 tablet Marciana Uplinger S, FNP   fluticasone (FLONASE) 50 MCG/ACT nasal spray Place 1 spray into both nostrils daily for 14 days. 16 g Jadarious Dobbins, Darrelyn Hillock, FNP   fluconazole (DIFLUCAN) 150 MG tablet Take 1 tablet (150 mg total) by mouth daily. Take the second dose 72 hours after the first dose if symptom does not resolve 3 tablet Nikkole Placzek, Darrelyn Hillock, FNP     PDMP not reviewed this encounter.   Emerson Monte, [place] 01/07/20 1553

## 2020-01-07 NOTE — ED Triage Notes (Signed)
Pt has had recurrent sore throat on right side. Pt has been treated with azithromycin recently with no releif

## 2020-01-10 LAB — CULTURE, GROUP A STREP (THRC)

## 2020-02-24 ENCOUNTER — Other Ambulatory Visit: Payer: Self-pay

## 2020-02-26 ENCOUNTER — Other Ambulatory Visit: Payer: Self-pay

## 2020-02-26 LAB — CBC
HCT: 41.4 % (ref 35.0–45.0)
Hemoglobin: 13.1 g/dL (ref 11.7–15.5)
MCH: 27.8 pg (ref 27.0–33.0)
MCHC: 31.6 g/dL — ABNORMAL LOW (ref 32.0–36.0)
MCV: 87.7 fL (ref 80.0–100.0)
MPV: 12.2 fL (ref 7.5–12.5)
Platelets: 203 10*3/uL (ref 140–400)
RBC: 4.72 10*6/uL (ref 3.80–5.10)
RDW: 15.2 % — ABNORMAL HIGH (ref 11.0–15.0)
WBC: 4.8 10*3/uL (ref 3.8–10.8)

## 2020-02-26 LAB — LIPID PANEL
Cholesterol: 211 mg/dL — ABNORMAL HIGH (ref <200)
HDL: 62 mg/dL (ref >=50)
LDL Cholesterol (Calc): 121 mg/dL (calc) — ABNORMAL HIGH
Non-HDL Cholesterol (Calc): 149 mg/dL (calc) — ABNORMAL HIGH (ref <130)
Total CHOL/HDL Ratio: 3.4 (calc) (ref <5.0)
Triglycerides: 167 mg/dL — ABNORMAL HIGH (ref <150)

## 2020-02-26 LAB — COMPLETE METABOLIC PANEL WITH GFR
AG Ratio: 1.3 (calc) (ref 1.0–2.5)
ALT: 9 U/L (ref 6–29)
AST: 15 U/L (ref 10–35)
Albumin: 3.9 g/dL (ref 3.6–5.1)
Alkaline phosphatase (APISO): 49 U/L (ref 37–153)
BUN/Creatinine Ratio: 11 (calc) (ref 6–22)
BUN: 16 mg/dL (ref 7–25)
CO2: 30 mmol/L (ref 20–32)
Calcium: 9.6 mg/dL (ref 8.6–10.4)
Chloride: 102 mmol/L (ref 98–110)
Creat: 1.41 mg/dL — ABNORMAL HIGH (ref 0.60–0.93)
GFR, Est African American: 43 mL/min/{1.73_m2} — ABNORMAL LOW (ref >=60)
GFR, Est Non African American: 37 mL/min/{1.73_m2} — ABNORMAL LOW (ref >=60)
Globulin: 3 g/dL (calc) (ref 1.9–3.7)
Glucose, Bld: 112 mg/dL — ABNORMAL HIGH (ref 65–99)
Potassium: 3.4 mmol/L — ABNORMAL LOW (ref 3.5–5.3)
Sodium: 142 mmol/L (ref 135–146)
Total Bilirubin: 0.5 mg/dL (ref 0.2–1.2)
Total Protein: 6.9 g/dL (ref 6.1–8.1)

## 2020-02-26 LAB — HEMOGLOBIN A1C
Hgb A1c MFr Bld: 5.6 % of total Hgb (ref <5.7)
Mean Plasma Glucose: 114 (calc)
eAG (mmol/L): 6.3 (calc)

## 2020-02-26 MED ORDER — SODIUM BICARBONATE 650 MG PO TABS: 650.0000 mg | ORAL_TABLET | Freq: Two times a day (BID) | ORAL | 0 refills | Status: DC

## 2020-02-26 MED ORDER — LISINOPRIL 2.5 MG PO TABS: 2.5000 mg | ORAL_TABLET | Freq: Every day | ORAL | 0 refills | Status: DC

## 2020-02-26 MED ORDER — TOVIAZ 4 MG PO TB24: 4.0000 mg | ORAL_TABLET | Freq: Every day | ORAL | 0 refills | Status: DC

## 2020-03-03 ENCOUNTER — Encounter: Payer: Self-pay | Admitting: Family Medicine

## 2020-03-03 ENCOUNTER — Other Ambulatory Visit: Payer: Self-pay

## 2020-03-03 ENCOUNTER — Telehealth (INDEPENDENT_AMBULATORY_CARE_PROVIDER_SITE_OTHER): Payer: Medicare PPO | Admitting: Family Medicine

## 2020-03-03 VITALS — BP 119/74 | Ht 68.0 in | Wt 204.0 lb

## 2020-03-03 DIAGNOSIS — E119 Type 2 diabetes mellitus without complications: Secondary | ICD-10-CM | POA: Diagnosis not present

## 2020-03-03 DIAGNOSIS — E782 Mixed hyperlipidemia: Secondary | ICD-10-CM | POA: Diagnosis not present

## 2020-03-03 DIAGNOSIS — I10 Essential (primary) hypertension: Secondary | ICD-10-CM | POA: Diagnosis not present

## 2020-03-03 DIAGNOSIS — K219 Gastro-esophageal reflux disease without esophagitis: Secondary | ICD-10-CM | POA: Diagnosis not present

## 2020-03-03 DIAGNOSIS — R070 Pain in throat: Secondary | ICD-10-CM

## 2020-03-03 DIAGNOSIS — F419 Anxiety disorder, unspecified: Secondary | ICD-10-CM

## 2020-03-03 HISTORY — DX: Pain in throat: R07.0

## 2020-03-03 MED ORDER — BUSPIRONE HCL 5 MG PO TABS: 5.0000 mg | ORAL_TABLET | Freq: Two times a day (BID) | ORAL | 1 refills | Status: DC

## 2020-03-03 NOTE — Assessment & Plan Note (Signed)
She is encouraged to continue cholesterol control with diet control.  And medication.

## 2020-03-03 NOTE — Progress Notes (Addendum)
Virtual Visit via Telephone Note   This visit type was conducted due to national recommendations for restrictions regarding the COVID-19 Pandemic (e.g. social distancing) in an effort to limit this patient's exposure and mitigate transmission in our community.  Due to her co-morbid illnesses, this patient is at least at moderate risk for complications without adequate follow up.  This format is felt to be most appropriate for this patient at this time.  The patient did not have access to video technology/had technical difficulties with video requiring transitioning to audio format only (telephone).  All issues noted in this document were discussed and addressed.  No physical exam could be performed with this format.    Evaluation Performed:  Follow-up visit  Date:  03/03/2020   ID:  [name], DOB 06/30/1946, MRN [id number]  Patient Location: Home Provider Location: Office/Clinic  Location of Patient: Home Location of Provider: Telehealth Consent was obtain for visit to be over via telehealth. I verified that I am speaking with the correct person using two identifiers.  PCP:  Freddy Finner, NP   Chief Complaint: 72-month follow-up History of Present Illness:    [name] is a 74 y.o. female with history of tension, hyperlipidemia, arthritis, diabetes.  Reports taking her medications okay without any issue.  Denies having any sleep trouble.  Denies have any changes in bowel or bladder habits.  Denies having any blood in stool or urine.  Denies having any changes in dentition chewing and swallowing okay.  Denies having any memory changes denies having any falls.  Denies having any skin issues or concerns to discuss today.  Overall is doing well without any issues.  ENT noted some inflammation in throat. She is adjusting how she takes protonix.   The patient does not have symptoms concerning for COVID-19 infection (fever, chills, cough, or new shortness of breath).   Past  Medical, Surgical, Social History, Allergies, and Medications have been Reviewed.  Past Medical History:  Diagnosis Date  . Arthritis   . Coronary artery disease   . Diabetes mellitus without complication (HCC)   . Encephalopathy 03/02/2014  . Hyperlipidemia   . Hypertension   . Osteoporosis   . Renal disorder   . Sarcoidosis    Past Surgical History:  Procedure Laterality Date  . ANKLE SURGERY    . BACK SURGERY    . FRACTURE SURGERY     tibia      Current Meds  Medication Sig  . alendronate (FOSAMAX) 70 MG tablet Take 1 tablet (70 mg total) by mouth once a week. Take with a full glass of water on an empty stomach.  Marland Kitchen azithromycin (ZITHROMAX) 250 MG tablet 500 mg by mouth on day 1, then 250 mg by mouth on days 2-5.  Marland Kitchen darifenacin (ENABLEX) 15 MG 24 hr tablet Take 15 mg by mouth daily.  Marland Kitchen escitalopram (LEXAPRO) 20 MG tablet Take 20 mg by mouth at bedtime.   . fentaNYL (DURAGESIC) 25 MCG/HR patch Place 1 patch (25 mcg total) onto the skin every 3 (three) days.  . furosemide (LASIX) 40 MG tablet Take 40 mg by mouth 2 (two) times daily.  Marland Kitchen glipiZIDE (GLUCOTROL XL) 5 MG 24 hr tablet Take 5 mg by mouth daily with breakfast.  . hydroxychloroquine (PLAQUENIL) 200 MG tablet Take 1 tablet (200 mg total) by mouth daily.  Marland Kitchen lisinopril (ZESTRIL) 2.5 MG tablet Take 1 tablet (2.5 mg total) by mouth daily.  Marland Kitchen menthol-cetylpyridinium (CEPACOL) 3  MG lozenge Take 1 lozenge (3 mg total) by mouth as needed for sore throat.  . methocarbamol (ROBAXIN) 500 MG tablet Take 500 mg by mouth every 6 (six) hours as needed for muscle spasms.  Marland Kitchen oxycodone (OXY-IR) 5 MG capsule Take 1 capsule (5 mg total) by mouth 4 (four) times daily as needed for pain.  . pantoprazole (PROTONIX) 40 MG tablet Take 40 mg by mouth 2 (two) times daily.  . potassium chloride SA (KLOR-CON) 20 MEQ tablet Take 1 tablet (20 mEq total) by mouth 2 (two) times daily.  . pregabalin (LYRICA) 50 MG capsule Take 1 capsule (50 mg total) by  mouth at bedtime.  . simvastatin (ZOCOR) 40 MG tablet Take 1 tablet (40 mg total) by mouth at bedtime.  . sodium bicarbonate 650 MG tablet Take 1 tablet (650 mg total) by mouth 2 (two) times daily.  . TOVIAZ 4 MG TB24 tablet Take 1 tablet (4 mg total) by mouth daily.  . traMADol (ULTRAM) 50 MG tablet Take 1 tablet (50 mg total) by mouth every 6 (six) hours as needed.  . Vitamin D, Ergocalciferol, (DRISDOL) 1.25 MG (50000 UNIT) CAPS capsule Take 1 capsule (50,000 Units total) by mouth every 7 (seven) days. *take once weekly on the same day each week*     Allergies:   Darvon [propoxyphene], Morphine and related, and Penicillins   ROS:   Please see the history of present illness.    All other systems reviewed and are negative.   Labs/Other Tests and Data Reviewed:    Recent Labs: 02/25/2020: ALT 9; BUN 16; Creat 1.41; Hemoglobin 13.1; Platelets 203; Potassium 3.4; Sodium 142   Recent Lipid Panel Lab Results  Component Value Date/Time   CHOL 211 (H) 02/25/2020 03:30 PM   TRIG 167 (H) 02/25/2020 03:30 PM   HDL 62 02/25/2020 03:30 PM   CHOLHDL 3.4 02/25/2020 03:30 PM   LDLCALC 121 (H) 02/25/2020 03:30 PM    Wt Readings from Last 3 Encounters:  03/03/20 204 lb (92.5 kg)  12/31/19 204 lb (92.5 kg)  12/02/19 204 lb (92.5 kg)     Objective:    Vital Signs:  BP 119/74   Ht 5\' 8"  (1.727 m)   Wt 204 lb (92.5 kg)   BMI 31.02 kg/m    VITAL SIGNS:  reviewed GEN:  alert and oriented  RESPIRATORY:  no shortness of breath in conversation  PSYCH:  normal affect and mood   GAD 7 : Generalized Anxiety Score 03/03/2020  Nervous, Anxious, on Edge 1  Control/stop worrying 0  Worry too much - different things 0  Trouble relaxing 1  Restless 0  Easily annoyed or irritable 0  Afraid - awful might happen 0  Total GAD 7 Score 2  Anxiety Difficulty Somewhat difficult      ASSESSMENT & PLAN:      1. Essential hypertension   2. Diabetes mellitus without complication (HCC)   3.  Mixed hyperlipidemia  4. Gastroesophageal reflux disease, unspecified whether esophagitis present  5. Anxiety  - busPIRone (BUSPAR) 5 MG tablet; Take 1 tablet (5 mg total) by mouth 2 (two) times daily.  Dispense: 60 tablet; Refill: 1   Time:   Today, I have spent 15 minutes with the patient with telehealth technology discussing the above problems.     Medication Adjustments/Labs and Tests Ordered: Current medicines are reviewed at length with the patient today.  Concerns regarding medicines are outlined above.   Tests Ordered: No orders of the defined  types were placed in this encounter.   Medication Changes: No orders of the defined types were placed in this encounter.   Disposition:  Follow up 6 weeks in office  Signed, Freddy Finner, NP  03/03/2020 2:26 PM     [facility]

## 2020-03-03 NOTE — Assessment & Plan Note (Signed)
[name] is encouraged to check blood sugar daily as directed. Continue current medications. Is on statin as well. Educated on importance of maintain a well balanced diabetic friendly diet.  She is reminded the importance of maintaining  good blood sugars,  taking medications as directed, daily foot care, annual eye exams. Additionally educated about keeping good control over blood pressure and cholesterol as well.

## 2020-03-03 NOTE — Assessment & Plan Note (Signed)
Continue Protonix as directed by ENT

## 2020-03-03 NOTE — Patient Instructions (Addendum)
I appreciate the opportunity to provide you with care for your health and wellness. Today we discussed: anxiety  Follow up: 6 weeks in office  No labs or referrals today  Start Buspar daily as directed. Call if you have any trouble or concerns. Hope you start to feel better and can relax and rest soon.   Please continue to practice social distancing to keep you, your family, and our community safe.  If you must go out, please wear a mask and practice good handwashing.  It was a pleasure to see you and I look forward to continuing to work together on your health and well-being. Please do not hesitate to call the office if you need care or have questions about your care.  Have a wonderful day and week. With Gratitude, Tereasa Coop, DNP, AGNP-BC

## 2020-03-03 NOTE — Assessment & Plan Note (Signed)
[name] is encouraged to maintain a well balanced diet that is low in salt. Controlled, continue current medication regimen. Additionally, she is also reminded that exercise is beneficial for heart health and control of  Blood pressure. 30-60 minutes daily is recommended-walking was suggested.

## 2020-03-03 NOTE — Addendum Note (Signed)
Addended by: Freddy Finner on: 03/03/2020 03:03 PM   Modules accepted: Orders

## 2020-03-16 ENCOUNTER — Other Ambulatory Visit: Payer: Self-pay

## 2020-03-16 MED ORDER — ALENDRONATE SODIUM 70 MG PO TABS: 70.0000 mg | ORAL_TABLET | ORAL | 1 refills | Status: DC

## 2020-03-16 MED ORDER — VITAMIN D (ERGOCALCIFEROL) 1.25 MG (50000 UNIT) PO CAPS: 50000.0000 [IU] | ORAL_CAPSULE | ORAL | 1 refills | Status: DC

## 2020-03-16 MED ORDER — FUROSEMIDE 40 MG PO TABS: 40.0000 mg | ORAL_TABLET | Freq: Two times a day (BID) | ORAL | 1 refills | Status: DC

## 2020-03-17 ENCOUNTER — Telehealth: Payer: Self-pay

## 2020-03-17 NOTE — Telephone Encounter (Signed)
Pt is requesting Lyrica 100mg   60 capsules-2 times daily  sent to [facility] pharmacy   Pt states that [facility] was prescribing the medication , but isnt anymore,and she is out tomorrow

## 2020-03-17 NOTE — Telephone Encounter (Signed)
Do you want to fill this or does she need a visit ?

## 2020-03-17 NOTE — Telephone Encounter (Signed)
She will need to have him prescribe it as he started it and is treating her with it.

## 2020-03-18 NOTE — Telephone Encounter (Signed)
She said the only reason he was prescribing the lyrica is because dr Juanetta Gosling left and she didn't have a primary care dr the only thing he is prescribing the pain management medication please advise

## 2020-03-18 NOTE — Telephone Encounter (Signed)
I do not feel comfortable prescribing this medication.  Given the pain management medications that she is on. If she would like to talk to Dr. Gerilyn Pilgrim about continuing this.  She can do so.  However at this time I do not feel like this is a good or safe medications for her to be on.

## 2020-03-18 NOTE — Telephone Encounter (Signed)
LVM for pt to call the office regarding medication  °

## 2020-03-18 NOTE — Telephone Encounter (Signed)
Pt advised of recommendations with verbal understanding  

## 2020-03-29 ENCOUNTER — Other Ambulatory Visit: Payer: Self-pay | Admitting: *Deleted

## 2020-03-29 MED ORDER — LISINOPRIL 2.5 MG PO TABS: 2.5000 mg | ORAL_TABLET | Freq: Every day | ORAL | 0 refills | Status: DC

## 2020-03-29 MED ORDER — TOVIAZ 4 MG PO TB24: 4.0000 mg | ORAL_TABLET | Freq: Every day | ORAL | 0 refills | Status: DC

## 2020-03-29 MED ORDER — SODIUM BICARBONATE 650 MG PO TABS: 650.0000 mg | ORAL_TABLET | Freq: Two times a day (BID) | ORAL | 0 refills | Status: DC

## 2020-04-14 ENCOUNTER — Telehealth: Payer: Medicare PPO | Admitting: Family Medicine

## 2020-04-21 ENCOUNTER — Encounter: Payer: Self-pay | Admitting: Family Medicine

## 2020-04-21 ENCOUNTER — Other Ambulatory Visit: Payer: Self-pay

## 2020-04-21 ENCOUNTER — Telehealth (INDEPENDENT_AMBULATORY_CARE_PROVIDER_SITE_OTHER): Payer: Medicare PPO | Admitting: Family Medicine

## 2020-04-21 VITALS — BP 119/74 | Ht 68.0 in | Wt 204.0 lb

## 2020-04-21 DIAGNOSIS — K146 Glossodynia: Secondary | ICD-10-CM | POA: Diagnosis not present

## 2020-04-21 DIAGNOSIS — F321 Major depressive disorder, single episode, moderate: Secondary | ICD-10-CM

## 2020-04-21 MED ORDER — MAGIC MOUTHWASH W/LIDOCAINE: 5.0000 mL | Freq: Two times a day (BID) | ORAL | 0 refills | Status: DC | PRN

## 2020-04-21 NOTE — Progress Notes (Signed)
Virtual Visit via Telephone Note   This visit type was conducted due to national recommendations for restrictions regarding the COVID-19 Pandemic (e.g. social distancing) in an effort to limit this patient's exposure and mitigate transmission in our community.  Due to her co-morbid illnesses, this patient is at least at moderate risk for complications without adequate follow up.  This format is felt to be most appropriate for this patient at this time.  The patient did not have access to video technology/had technical difficulties with video requiring transitioning to audio format only (telephone).  All issues noted in this document were discussed and addressed.  No physical exam could be performed with this format.    Evaluation Performed:  Follow-up visit  Date:  04/21/2020   ID:  [name], DOB [birth year], MRN [id number]  Patient Location: Home Provider Location: Office/Clinic  Location of Patient: Home Location of Provider: Telehealth Consent was obtain for visit to be over via telehealth. I verified that I am speaking with the correct person using two identifiers.  PCP:  Freddy Finner, NP   Chief Complaint: 8-week follow-up  History of Present Illness:    [name] is a 74 y.o. female with significant history of pain, diabetes, hyperlipidemia, hypertension, osteoporosis, sarcoidosis.  Is on pain management medication as well.  Overall she reports that she is doing okay but she needs a refill of a solution called doxepin for her mouth because she gets neurological pains in her tongue.  I have never prescribed this medication.  She reports that she does not see the doctor that normally prescribes this medication to her and she is just now getting reestablished on her Lyrica.  I have advised that I do not prescribe this type of medication for when she is needing it for.  But I am happy to do a different type of mouthwash that might help with the discomfort she is experiencing.   She is in agreements with this.  She denies having any other issues or concerns to discuss today.  The patient does not have symptoms concerning for COVID-19 infection (fever, chills, cough, or new shortness of breath).   Past Medical, Surgical, Social History, Allergies, and Medications have been Reviewed.  Past Medical History:  Diagnosis Date  . Arthritis   . Coronary artery disease   . Diabetes mellitus without complication (HCC)   . Encephalopathy 03/02/2014  . Hyperlipidemia   . Hypertension   . Osteoporosis   . Renal disorder   . Sarcoidosis    Past Surgical History:  Procedure Laterality Date  . ANKLE SURGERY    . BACK SURGERY    . FRACTURE SURGERY     tibia      Current Meds  Medication Sig  . alendronate (FOSAMAX) 70 MG tablet Take 1 tablet (70 mg total) by mouth once a week. Take with a full glass of water on an empty stomach.  . busPIRone (BUSPAR) 5 MG tablet Take 1 tablet (5 mg total) by mouth 2 (two) times daily.  Marland Kitchen darifenacin (ENABLEX) 15 MG 24 hr tablet Take 15 mg by mouth daily.  Marland Kitchen escitalopram (LEXAPRO) 20 MG tablet Take 20 mg by mouth at bedtime.   . fentaNYL (DURAGESIC) 25 MCG/HR patch Place 1 patch (25 mcg total) onto the skin every 3 (three) days.  . furosemide (LASIX) 40 MG tablet Take 1 tablet (40 mg total) by mouth 2 (two) times daily.  Marland Kitchen glipiZIDE (GLUCOTROL XL) 5 MG 24  hr tablet Take 5 mg by mouth daily with breakfast.  . hydroxychloroquine (PLAQUENIL) 200 MG tablet Take 1 tablet (200 mg total) by mouth daily.  Marland Kitchen lisinopril (ZESTRIL) 2.5 MG tablet Take 1 tablet (2.5 mg total) by mouth daily.  Marland Kitchen menthol-cetylpyridinium (CEPACOL) 3 MG lozenge Take 1 lozenge (3 mg total) by mouth as needed for sore throat.  . methocarbamol (ROBAXIN) 500 MG tablet Take 500 mg by mouth every 6 (six) hours as needed for muscle spasms.  Marland Kitchen oxycodone (OXY-IR) 5 MG capsule Take 1 capsule (5 mg total) by mouth 4 (four) times daily as needed for pain.  . pantoprazole (PROTONIX)  40 MG tablet Take 40 mg by mouth 2 (two) times daily.  . potassium chloride SA (KLOR-CON) 20 MEQ tablet Take 1 tablet (20 mEq total) by mouth 2 (two) times daily.  . pregabalin (LYRICA) 50 MG capsule Take 1 capsule (50 mg total) by mouth at bedtime.  . simvastatin (ZOCOR) 40 MG tablet Take 1 tablet (40 mg total) by mouth at bedtime.  . sodium bicarbonate 650 MG tablet Take 1 tablet (650 mg total) by mouth 2 (two) times daily.  . TOVIAZ 4 MG TB24 tablet Take 1 tablet (4 mg total) by mouth daily.  . traMADol (ULTRAM) 50 MG tablet Take 1 tablet (50 mg total) by mouth every 6 (six) hours as needed.  . Vitamin D, Ergocalciferol, (DRISDOL) 1.25 MG (50000 UNIT) CAPS capsule Take 1 capsule (50,000 Units total) by mouth every 7 (seven) days. *take once weekly on the same day each week*     Allergies:   Darvon [propoxyphene], Morphine and related, and Penicillins   ROS:   Please see the history of present illness.    All other systems reviewed and are negative.   Labs/Other Tests and Data Reviewed:    Recent Labs: 02/25/2020: ALT 9; BUN 16; Creat 1.41; Hemoglobin 13.1; Platelets 203; Potassium 3.4; Sodium 142   Recent Lipid Panel Lab Results  Component Value Date/Time   CHOL 211 (H) 02/25/2020 03:30 PM   TRIG 167 (H) 02/25/2020 03:30 PM   HDL 62 02/25/2020 03:30 PM   CHOLHDL 3.4 02/25/2020 03:30 PM   LDLCALC 121 (H) 02/25/2020 03:30 PM    Wt Readings from Last 3 Encounters:  04/21/20 204 lb (92.5 kg)  03/03/20 204 lb (92.5 kg)  12/31/19 204 lb (92.5 kg)     Objective:    Vital Signs:  BP 119/74   Ht 5\' 8"  (1.727 m)   Wt 204 lb (92.5 kg)   BMI 31.02 kg/m    VITAL SIGNS:  reviewed GEN:  no acute distress RESPIRATORY:  no shortness of breath in conversation PSYCH:  normal affect   ASSESSMENT & PLAN:    1. Tongue pain   2. Depression, major, single episode, moderate (HCC)    Time:   Today, I have spent 10 minutes with the patient with telehealth technology discussing the  above problems.     Medication Adjustments/Labs and Tests Ordered: Current medicines are reviewed at length with the patient today.  Concerns regarding medicines are outlined above.   Tests Ordered: No orders of the defined types were placed in this encounter.   Medication Changes: No orders of the defined types were placed in this encounter.   Disposition:  Follow up 5 months  Signed, , NP  04/21/2020 10:41 AM     04/23/2020 [facility]

## 2020-04-22 ENCOUNTER — Encounter: Payer: Self-pay | Admitting: Family Medicine

## 2020-04-22 DIAGNOSIS — K146 Glossodynia: Secondary | ICD-10-CM | POA: Insufficient documentation

## 2020-04-22 NOTE — Patient Instructions (Signed)
I appreciate the opportunity to provide you with care for your health and wellness. Today we discussed: mouth rinse needs  Follow up: 5 months or as needed  No labs or referrals today  Please take the medication as directed. Call if you have problems.  Please continue to practice social distancing to keep you, your family, and our community safe.  If you must go out, please wear a mask and practice good handwashing.  It was a pleasure to see you and I look forward to continuing to work together on your health and well-being. Please do not hesitate to call the office if you need care or have questions about your care.  Have a wonderful day and week. With Gratitude, Tereasa Coop, DNP, AGNP-BC

## 2020-04-22 NOTE — Assessment & Plan Note (Signed)
Magic mouthwash with lidocaine provided. Encouraged that she is not to eat or drink anything after using the mouthwash secondary to the numbness that it can cause.  She reports understanding of this.  I also advised her not to swallow the medication and to spit it back out.  She reports understanding of this as well.

## 2020-04-22 NOTE — Assessment & Plan Note (Signed)
PHQ 0.  Denies having any SI or HI.  Continue Lexapro at this time.

## 2020-04-26 ENCOUNTER — Other Ambulatory Visit: Payer: Self-pay | Admitting: *Deleted

## 2020-04-26 MED ORDER — LISINOPRIL 2.5 MG PO TABS: 2.5000 mg | ORAL_TABLET | Freq: Every day | ORAL | 0 refills | Status: DC

## 2020-04-26 MED ORDER — HYDROXYCHLOROQUINE SULFATE 200 MG PO TABS: 200.0000 mg | ORAL_TABLET | Freq: Every day | ORAL | 3 refills | Status: DC

## 2020-04-26 MED ORDER — SODIUM BICARBONATE 650 MG PO TABS: 650.0000 mg | ORAL_TABLET | Freq: Two times a day (BID) | ORAL | 0 refills | Status: DC

## 2020-04-26 MED ORDER — TOVIAZ 4 MG PO TB24: 4.0000 mg | ORAL_TABLET | Freq: Every day | ORAL | 0 refills | Status: DC

## 2020-04-28 ENCOUNTER — Telehealth: Payer: Self-pay | Admitting: *Deleted

## 2020-04-28 NOTE — Telephone Encounter (Signed)
Referral to ENT °

## 2020-04-28 NOTE — Telephone Encounter (Signed)
Pt called just wanted to let you know that the magic mouthwash is not working and her tongue is worse. What would you advise?

## 2020-04-29 ENCOUNTER — Other Ambulatory Visit: Payer: Self-pay | Admitting: *Deleted

## 2020-04-29 DIAGNOSIS — K146 Glossodynia: Secondary | ICD-10-CM

## 2020-04-29 NOTE — Telephone Encounter (Signed)
LVM for pt to let her know we placed referral to ENT

## 2020-05-10 ENCOUNTER — Ambulatory Visit (INDEPENDENT_AMBULATORY_CARE_PROVIDER_SITE_OTHER): Payer: Medicare PPO | Admitting: Otolaryngology

## 2020-05-10 ENCOUNTER — Other Ambulatory Visit: Payer: Self-pay

## 2020-05-10 ENCOUNTER — Encounter (INDEPENDENT_AMBULATORY_CARE_PROVIDER_SITE_OTHER): Payer: Self-pay | Admitting: Otolaryngology

## 2020-05-10 VITALS — Temp 97.7°F

## 2020-05-10 DIAGNOSIS — J312 Chronic pharyngitis: Secondary | ICD-10-CM | POA: Diagnosis not present

## 2020-05-10 DIAGNOSIS — J310 Chronic rhinitis: Secondary | ICD-10-CM | POA: Diagnosis not present

## 2020-05-10 NOTE — Progress Notes (Signed)
HPI: [name] is a 74 y.o. female who presents is referred by her PCP for evaluation of chronic throat and tongue pain.  She apparently is seen at a pain clinic because of chronic narcotic use.  She complains of pain on her tongue as well as pain in the right side of her throat.  For the tongue pain one of her doctors prescribed doxepin solution that she uses and will help with the pain in the tongue.  Doxepin is a tricyclic antidepressant.  However she also takes oxycodone and other narcotics for chronic pain that is managed by the pain clinic. She wanted her lymph nodes checked and also to make sure she does not have cancer..  Past Medical History:  Diagnosis Date  . Arthritis   . Coronary artery disease   . Diabetes mellitus without complication (HCC)   . Encephalopathy 03/02/2014  . Hyperlipidemia   . Hypertension   . Osteoporosis   . Referred otalgia of right ear 02/11/2017  . Renal disorder   . Sarcoidosis   . Throat pain 03/03/2020   Past Surgical History:  Procedure Laterality Date  . ANKLE SURGERY    . BACK SURGERY    . FRACTURE SURGERY     tibia    Social History   Socioeconomic History  . Marital status: Married    Spouse name: Christen Bame   . Number of children: 3  . Years of education: Not on file  . Highest education level: Not on file  Occupational History  . Not on file  Tobacco Use  . Smoking status: Never Smoker  . Smokeless tobacco: Never Used  Substance and Sexual Activity  . Alcohol use: No  . Drug use: No  . Sexual activity: Not Currently  Other Topics Concern  . 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 Difficulty of Paying Living Expenses: Not hard at all  Food Insecurity: No Food Insecurity  . Worried About Programme researcher, broadcasting/film/video in the Last Year: Never true  . Ran Out of Food in the Last Year: Never true  Transportation Needs: No Transportation Needs  . Lack of Transportation (Medical): No  . Lack of Transportation (Non-Medical): No  Physical Activity: Inactive  . Days of Exercise per Week: 0 days  . Minutes of Exercise per Session: 0 min  Stress: No Stress Concern Present  . Feeling of Stress : Not at all  Social Connections: Moderately Isolated  . Frequency of Communication with Friends and Family: Twice a week  . Frequency of Social Gatherings with Friends and Family: Once a week  . Attends Religious Services: Never  . Active Member of Clubs or Organizations: No  . Attends Banker Meetings: Never  . Marital Status: Married   Family History  Problem Relation Age of Onset  . Hypertension Mother   . Hyperlipidemia Mother   .  Throat cancer Father    Allergies  Allergen Reactions  . Darvon [Propoxyphene] Nausea And Vomiting  . Morphine And Related Itching and Rash  . Penicillins Itching and Rash    Pt denies this allergy   Prior to Admission medications   Medication Sig Start Date End Date Taking? Authorizing Provider  alendronate (FOSAMAX) 70 MG tablet Take 1 tablet (70 mg total) by mouth once a week. Take with a full glass of water on an empty stomach. 03/16/20  Yes Freddy Finner, NP  busPIRone (BUSPAR) 5 MG tablet Take 1 tablet (5 mg total) by mouth 2 (two) times daily. 03/03/20  Yes Freddy Finner, NP  darifenacin (ENABLEX) 15 MG 24 hr tablet Take 15 mg by mouth daily.   Yes [provider]  escitalopram (LEXAPRO) 20 MG tablet Take 20 mg by mouth at bedtime.    Yes [provider]  fentaNYL (DURAGESIC) 25 MCG/HR patch Place 1 patch (25 mcg total)  onto the skin every 3 (three) days. 03/05/14  Yes Kari Baars, MD  furosemide (LASIX) 40 MG tablet Take 1 tablet (40 mg total) by mouth 2 (two) times daily. 03/16/20  Yes Freddy Finner, NP  glipiZIDE (GLUCOTROL XL) 5 MG 24 hr tablet Take 5 mg by mouth daily with breakfast.   Yes [provider]  hydroxychloroquine (PLAQUENIL) 200 MG tablet Take 1 tablet (200 mg total) by mouth daily. 04/26/20  Yes Freddy Finner, NP  lisinopril (ZESTRIL) 2.5 MG tablet Take 1 tablet (2.5 mg total) by mouth daily. 04/26/20  Yes Freddy Finner, NP  magic mouthwash w/lidocaine SOLN Take 5 mLs by mouth 2 (two) times daily as needed for mouth pain. 04/21/20  Yes Freddy Finner, NP  menthol-cetylpyridinium (CEPACOL) 3 MG lozenge Take 1 lozenge (3 mg total) by mouth as needed for sore throat. 01/07/20  Yes Avegno, Zachery Dakins, FNP  methocarbamol (ROBAXIN) 500 MG tablet Take 500 mg by mouth every 6 (six) hours as needed for muscle spasms.   Yes [provider]  oxycodone (OXY-IR) 5 MG capsule Take 1 capsule (5 mg total) by mouth 4 (four) times daily as needed for pain. 03/05/14  Yes Kari Baars, MD  pantoprazole (PROTONIX) 40 MG tablet Take 40 mg by mouth 2 (two) times daily.   Yes [provider]  potassium chloride SA (KLOR-CON) 20 MEQ tablet Take 1 tablet (20 mEq total) by mouth 2 (two) times daily. 12/07/19  Yes Freddy Finner, NP  pregabalin (LYRICA) 50 MG capsule Take 1 capsule (50 mg total) by mouth at bedtime. 03/05/14  Yes Kari Baars, MD  simvastatin (ZOCOR) 40 MG tablet Take 1 tablet (40 mg total) by mouth at bedtime. 12/07/19  Yes Freddy Finner, NP  sodium bicarbonate 650 MG tablet Take 1 tablet (650 mg total) by mouth 2 (two) times daily. 04/26/20  Yes Freddy Finner, NP  TOVIAZ 4 MG TB24 tablet Take 1 tablet (4 mg total) by mouth daily. 04/26/20  Yes Freddy Finner, NP  traMADol (ULTRAM) 50 MG tablet Take 1 tablet (50 mg total) by mouth every 6 (six) hours as needed. 01/18/17   Yes Bethann Berkshire, MD  Vitamin D, Ergocalciferol, (DRISDOL) 1.25 MG (50000 UNIT) CAPS capsule Take 1 capsule (50,000 Units total) by mouth every 7 (seven) days. *take once weekly on the same day each week* 03/16/20  Yes Freddy Finner, NP     Positive ROS: Otherwise negative  All other systems have been reviewed  and were otherwise negative with the exception of those mentioned in the HPI and as above.  Physical Exam: Constitutional: Alert, well-appearing, no acute distress Ears: External ears without lesions or tenderness. Ear canals are clear bilaterally with intact, clear TMs.  Nasal: External nose without lesions. Septum midline with mild rhinitis.  Both middle meatus regions are clear with no signs of infection or mucopurulent discharge..  On nasal endoscopy both middle meatus regions are clear as is the posterior nasal cavity. Oral: Lips and gums without lesions. Tongue is normal to evaluation with no exudate.  Tongue is soft to palpation.  Floor of mouth is clear.  Oral mucosa and palate mucosa was clear.  Indirect laryngoscopy revealed a clear base of tongue vallecula and epiglottis.  Vocal cords were a little difficult to visualize. Fiberoptic laryngoscopy was performed to the left nostril.  The nasopharynx was clear.  The base of tongue vallecula and epiglottis were normal.  Vocal cords were clear bilaterally with normal vocal cord mobility.  Hypopharyngeal mucosa was clear. Neck: No palpable adenopathy or masses.  There is no palpable adenopathy on either side of the neck. Respiratory: Breathing comfortably  Skin: No facial/neck lesions or rash noted.  Laryngoscopy  Date/Time: 05/10/2020 5:27 PM Performed by: Drema Halon, MD Authorized by: Drema Halon, MD   Consent:    Consent obtained:  Verbal   Consent given by:  Patient Procedure details:    Indications: direct visualization of the upper aerodigestive tract     Medication:  Afrin   Instrument: flexible  fiberoptic laryngoscope     Scope location: left nare   Sinus:    Right middle meatus: normal     Left middle meatus: normal     Right nasopharynx: normal     Left nasopharynx: normal   Mouth:    Oropharynx: normal     Vallecula: normal     Base of tongue: normal     Epiglottis: normal   Throat:    True vocal cords: normal   Comments:     On fiberoptic laryngoscopy patient had a clear hypopharynx and larynx with no mucosal abnormalities noted.  No evidence of neoplasm or infection.    Assessment: Chronic sore throat.  Questionable etiology with normal clinical exam.  No evidence of infection or neoplasm. Mild rhinitis with clear mucus discharge.  Plan: For the nasal drainage recommended use of Nasacort 2 sprays each nostril at night as well as saline nasal irrigation as needed. Reassured patient of normal upper airway examination with no evidence of neoplasm or infection.   Narda Bonds, MD   CC:

## 2020-05-11 ENCOUNTER — Other Ambulatory Visit: Payer: Self-pay | Admitting: Family Medicine

## 2020-05-16 ENCOUNTER — Other Ambulatory Visit: Payer: Self-pay | Admitting: Family Medicine

## 2020-05-18 ENCOUNTER — Other Ambulatory Visit: Payer: Self-pay | Admitting: *Deleted

## 2020-05-18 MED ORDER — FUROSEMIDE 40 MG PO TABS: 40.0000 mg | ORAL_TABLET | Freq: Two times a day (BID) | ORAL | 1 refills | Status: DC

## 2020-05-23 ENCOUNTER — Other Ambulatory Visit: Payer: Self-pay | Admitting: Family Medicine

## 2020-05-30 ENCOUNTER — Other Ambulatory Visit: Payer: Self-pay | Admitting: *Deleted

## 2020-05-30 MED ORDER — HYDROXYCHLOROQUINE SULFATE 200 MG PO TABS: 200.0000 mg | ORAL_TABLET | Freq: Every day | ORAL | 3 refills | Status: DC

## 2020-05-30 MED ORDER — LISINOPRIL 2.5 MG PO TABS: 2.5000 mg | ORAL_TABLET | Freq: Every day | ORAL | 0 refills | Status: DC

## 2020-05-30 MED ORDER — SODIUM BICARBONATE 650 MG PO TABS: 650.0000 mg | ORAL_TABLET | Freq: Two times a day (BID) | ORAL | 0 refills | Status: DC

## 2020-05-31 ENCOUNTER — Other Ambulatory Visit: Payer: Self-pay | Admitting: *Deleted

## 2020-05-31 MED ORDER — TOVIAZ 4 MG PO TB24: 4.0000 mg | ORAL_TABLET | Freq: Every day | ORAL | 0 refills | Status: DC

## 2020-06-01 ENCOUNTER — Other Ambulatory Visit: Payer: Self-pay | Admitting: *Deleted

## 2020-06-01 MED ORDER — LISINOPRIL 2.5 MG PO TABS: 2.5000 mg | ORAL_TABLET | Freq: Every day | ORAL | 0 refills | Status: DC

## 2020-06-07 DIAGNOSIS — G894 Chronic pain syndrome: Secondary | ICD-10-CM | POA: Diagnosis not present

## 2020-06-07 DIAGNOSIS — E1142 Type 2 diabetes mellitus with diabetic polyneuropathy: Secondary | ICD-10-CM | POA: Diagnosis not present

## 2020-06-07 DIAGNOSIS — Z79891 Long term (current) use of opiate analgesic: Secondary | ICD-10-CM | POA: Diagnosis not present

## 2020-06-07 DIAGNOSIS — R002 Palpitations: Secondary | ICD-10-CM | POA: Diagnosis not present

## 2020-06-07 DIAGNOSIS — M5416 Radiculopathy, lumbar region: Secondary | ICD-10-CM | POA: Diagnosis not present

## 2020-06-07 DIAGNOSIS — M130 Polyarthritis, unspecified: Secondary | ICD-10-CM | POA: Diagnosis not present

## 2020-06-07 DIAGNOSIS — M5459 Other low back pain: Secondary | ICD-10-CM | POA: Diagnosis not present

## 2020-06-07 DIAGNOSIS — G4700 Insomnia, unspecified: Secondary | ICD-10-CM | POA: Diagnosis not present

## 2020-06-09 ENCOUNTER — Other Ambulatory Visit: Payer: Self-pay | Admitting: Family Medicine

## 2020-06-10 ENCOUNTER — Other Ambulatory Visit: Payer: Self-pay

## 2020-06-13 ENCOUNTER — Other Ambulatory Visit: Payer: Self-pay

## 2020-06-15 ENCOUNTER — Other Ambulatory Visit: Payer: Self-pay

## 2020-06-15 MED ORDER — ALENDRONATE SODIUM 70 MG PO TABS: 70.0000 mg | ORAL_TABLET | ORAL | 1 refills | Status: DC

## 2020-06-15 MED ORDER — VITAMIN D (ERGOCALCIFEROL) 1.25 MG (50000 UNIT) PO CAPS: 50000.0000 [IU] | ORAL_CAPSULE | ORAL | 1 refills | Status: DC

## 2020-06-16 ENCOUNTER — Other Ambulatory Visit: Payer: Self-pay

## 2020-06-16 MED ORDER — SIMVASTATIN 40 MG PO TABS: 40.0000 mg | ORAL_TABLET | Freq: Every day | ORAL | 5 refills | Status: DC

## 2020-06-28 ENCOUNTER — Other Ambulatory Visit: Payer: Self-pay | Admitting: Family Medicine

## 2020-07-04 ENCOUNTER — Other Ambulatory Visit: Payer: Self-pay

## 2020-07-04 MED ORDER — TOVIAZ 4 MG PO TB24: 4.0000 mg | ORAL_TABLET | Freq: Every day | ORAL | 0 refills | Status: DC

## 2020-07-04 MED ORDER — SODIUM BICARBONATE 650 MG PO TABS: 650.0000 mg | ORAL_TABLET | Freq: Two times a day (BID) | ORAL | 0 refills | Status: DC

## 2020-07-04 MED ORDER — LISINOPRIL 2.5 MG PO TABS: 2.5000 mg | ORAL_TABLET | Freq: Every day | ORAL | 0 refills | Status: DC

## 2020-07-05 DIAGNOSIS — M5416 Radiculopathy, lumbar region: Secondary | ICD-10-CM | POA: Diagnosis not present

## 2020-07-05 DIAGNOSIS — Z79891 Long term (current) use of opiate analgesic: Secondary | ICD-10-CM | POA: Diagnosis not present

## 2020-07-05 DIAGNOSIS — G4700 Insomnia, unspecified: Secondary | ICD-10-CM | POA: Diagnosis not present

## 2020-07-05 DIAGNOSIS — R002 Palpitations: Secondary | ICD-10-CM | POA: Diagnosis not present

## 2020-07-05 DIAGNOSIS — G894 Chronic pain syndrome: Secondary | ICD-10-CM | POA: Diagnosis not present

## 2020-07-05 DIAGNOSIS — M130 Polyarthritis, unspecified: Secondary | ICD-10-CM | POA: Diagnosis not present

## 2020-07-05 DIAGNOSIS — E1142 Type 2 diabetes mellitus with diabetic polyneuropathy: Secondary | ICD-10-CM | POA: Diagnosis not present

## 2020-07-05 DIAGNOSIS — M5459 Other low back pain: Secondary | ICD-10-CM | POA: Diagnosis not present

## 2020-07-18 ENCOUNTER — Other Ambulatory Visit: Payer: Self-pay | Admitting: Family Medicine

## 2020-07-19 ENCOUNTER — Other Ambulatory Visit: Payer: Self-pay

## 2020-07-19 MED ORDER — FUROSEMIDE 40 MG PO TABS: 40.0000 mg | ORAL_TABLET | Freq: Two times a day (BID) | ORAL | 1 refills | Status: DC

## 2020-08-02 ENCOUNTER — Other Ambulatory Visit: Payer: Self-pay | Admitting: Family Medicine

## 2020-08-02 DIAGNOSIS — M5416 Radiculopathy, lumbar region: Secondary | ICD-10-CM | POA: Diagnosis not present

## 2020-08-02 DIAGNOSIS — E1142 Type 2 diabetes mellitus with diabetic polyneuropathy: Secondary | ICD-10-CM | POA: Diagnosis not present

## 2020-08-02 DIAGNOSIS — M130 Polyarthritis, unspecified: Secondary | ICD-10-CM | POA: Diagnosis not present

## 2020-08-02 DIAGNOSIS — M5459 Other low back pain: Secondary | ICD-10-CM | POA: Diagnosis not present

## 2020-08-02 DIAGNOSIS — G4700 Insomnia, unspecified: Secondary | ICD-10-CM | POA: Diagnosis not present

## 2020-08-02 DIAGNOSIS — Z79891 Long term (current) use of opiate analgesic: Secondary | ICD-10-CM | POA: Diagnosis not present

## 2020-08-02 DIAGNOSIS — G894 Chronic pain syndrome: Secondary | ICD-10-CM | POA: Diagnosis not present

## 2020-08-02 DIAGNOSIS — R002 Palpitations: Secondary | ICD-10-CM | POA: Diagnosis not present

## 2020-08-03 ENCOUNTER — Other Ambulatory Visit: Payer: Self-pay

## 2020-08-03 MED ORDER — TOVIAZ 4 MG PO TB24: 4.0000 mg | ORAL_TABLET | Freq: Every day | ORAL | 0 refills | Status: DC

## 2020-08-08 ENCOUNTER — Other Ambulatory Visit: Payer: Self-pay | Admitting: Family Medicine

## 2020-08-08 ENCOUNTER — Other Ambulatory Visit: Payer: Self-pay

## 2020-08-08 MED ORDER — SODIUM BICARBONATE 650 MG PO TABS: 650.0000 mg | ORAL_TABLET | Freq: Two times a day (BID) | ORAL | 0 refills | Status: DC

## 2020-08-08 MED ORDER — LISINOPRIL 2.5 MG PO TABS: 2.5000 mg | ORAL_TABLET | Freq: Every day | ORAL | 0 refills | Status: DC

## 2020-08-17 ENCOUNTER — Other Ambulatory Visit: Payer: Self-pay | Admitting: Family Medicine

## 2020-08-18 ENCOUNTER — Telehealth: Payer: Medicare PPO | Admitting: Family Medicine

## 2020-08-18 ENCOUNTER — Other Ambulatory Visit: Payer: Self-pay

## 2020-08-31 ENCOUNTER — Other Ambulatory Visit: Payer: Self-pay | Admitting: Family Medicine

## 2020-08-31 DIAGNOSIS — M5459 Other low back pain: Secondary | ICD-10-CM | POA: Diagnosis not present

## 2020-08-31 DIAGNOSIS — G894 Chronic pain syndrome: Secondary | ICD-10-CM | POA: Diagnosis not present

## 2020-08-31 DIAGNOSIS — G4700 Insomnia, unspecified: Secondary | ICD-10-CM | POA: Diagnosis not present

## 2020-08-31 DIAGNOSIS — R002 Palpitations: Secondary | ICD-10-CM | POA: Diagnosis not present

## 2020-08-31 DIAGNOSIS — M5416 Radiculopathy, lumbar region: Secondary | ICD-10-CM | POA: Diagnosis not present

## 2020-08-31 DIAGNOSIS — M130 Polyarthritis, unspecified: Secondary | ICD-10-CM | POA: Diagnosis not present

## 2020-08-31 DIAGNOSIS — Z79891 Long term (current) use of opiate analgesic: Secondary | ICD-10-CM | POA: Diagnosis not present

## 2020-08-31 DIAGNOSIS — E1142 Type 2 diabetes mellitus with diabetic polyneuropathy: Secondary | ICD-10-CM | POA: Diagnosis not present

## 2020-09-02 ENCOUNTER — Other Ambulatory Visit: Payer: HMO

## 2020-09-02 ENCOUNTER — Other Ambulatory Visit: Payer: Self-pay | Admitting: Family Medicine

## 2020-09-02 ENCOUNTER — Other Ambulatory Visit: Payer: Self-pay

## 2020-09-02 DIAGNOSIS — Z20822 Contact with and (suspected) exposure to covid-19: Secondary | ICD-10-CM

## 2020-09-05 ENCOUNTER — Other Ambulatory Visit: Payer: Self-pay | Admitting: Family Medicine

## 2020-09-05 LAB — NOVEL CORONAVIRUS, NAA: SARS-CoV-2, NAA: NOT DETECTED

## 2020-09-06 ENCOUNTER — Other Ambulatory Visit: Payer: Self-pay

## 2020-09-06 MED ORDER — LISINOPRIL 2.5 MG PO TABS: 2.5000 mg | ORAL_TABLET | Freq: Every day | ORAL | 0 refills | Status: DC

## 2020-09-13 ENCOUNTER — Other Ambulatory Visit: Payer: Self-pay

## 2020-09-13 ENCOUNTER — Telehealth (INDEPENDENT_AMBULATORY_CARE_PROVIDER_SITE_OTHER): Payer: HMO | Admitting: Family Medicine

## 2020-09-13 ENCOUNTER — Encounter: Payer: Self-pay | Admitting: Family Medicine

## 2020-09-13 VITALS — Ht 68.0 in | Wt 200.0 lb

## 2020-09-13 DIAGNOSIS — F419 Anxiety disorder, unspecified: Secondary | ICD-10-CM

## 2020-09-13 DIAGNOSIS — F32A Depression, unspecified: Secondary | ICD-10-CM | POA: Insufficient documentation

## 2020-09-13 MED ORDER — BUSPIRONE HCL 5 MG PO TABS: 5.0000 mg | ORAL_TABLET | Freq: Two times a day (BID) | ORAL | 1 refills | Status: DC

## 2020-09-13 NOTE — Patient Instructions (Signed)
  I appreciate the opportunity to provide you with care for your health and wellness.  Follow up: 4 weeks in the office to check BP and tremor/anxiety  No labs or referrals today  Restart Buspar medication call if you have any trouble with it.  Please continue to practice social distancing to keep you, your family, and our community safe.  If you must go out, please wear a mask and practice good handwashing.  It was a pleasure to see you and I look forward to continuing to work together on your health and well-being. Please do not hesitate to call the office if you need care or have questions about your care.  Have a wonderful day. With Gratitude, Tereasa Coop, DNP, AGNP-BC
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
## 2020-09-14 ENCOUNTER — Other Ambulatory Visit: Payer: Self-pay

## 2020-09-14 ENCOUNTER — Encounter: Payer: Self-pay | Admitting: Nurse Practitioner

## 2020-09-14 ENCOUNTER — Other Ambulatory Visit: Payer: Self-pay | Admitting: Family Medicine

## 2020-09-14 ENCOUNTER — Ambulatory Visit (INDEPENDENT_AMBULATORY_CARE_PROVIDER_SITE_OTHER): Payer: HMO | Admitting: Nurse Practitioner

## 2020-09-14 DIAGNOSIS — Z0000 Encounter for general adult medical examination without abnormal findings: Secondary | ICD-10-CM

## 2020-09-14 MED ORDER — HYDROXYCHLOROQUINE SULFATE 200 MG PO TABS: 200.0000 mg | ORAL_TABLET | Freq: Every day | ORAL | 3 refills | Status: DC

## 2020-09-14 MED ORDER — SODIUM BICARBONATE 650 MG PO TABS: ORAL_TABLET | ORAL | 3 refills | Status: DC

## 2020-09-14 MED ORDER — FUROSEMIDE 40 MG PO TABS: 40.0000 mg | ORAL_TABLET | Freq: Two times a day (BID) | ORAL | 3 refills | Status: DC

## 2020-09-14 MED ORDER — POTASSIUM CHLORIDE CRYS ER 20 MEQ PO TBCR: 20.0000 meq | EXTENDED_RELEASE_TABLET | Freq: Two times a day (BID) | ORAL | 3 refills | Status: DC

## 2020-09-14 MED ORDER — LISINOPRIL 2.5 MG PO TABS: 2.5000 mg | ORAL_TABLET | Freq: Every day | ORAL | 0 refills | Status: DC

## 2020-09-14 MED ORDER — FESOTERODINE FUMARATE ER 4 MG PO TB24: 4.0000 mg | ORAL_TABLET | Freq: Every day | ORAL | 3 refills | Status: DC

## 2020-09-14 NOTE — Progress Notes (Signed)
Subjective:   [name] is a 75 y.o. female who presents for an Initial Medicare Annual Wellness Visit.  Review of Systems     Cardiac Risk Factors include: diabetes mellitus;hypertension;obesity (BMI >30kg/m2);sedentary lifestyle     Objective:    Today's Vitals   09/14/20 1317 09/14/20 1319  PainSc: 0-No pain 2   PainLoc:  Back   There is no height or weight on file to calculate BMI.  Advanced Directives 01/18/2017 03/02/2014  Does Patient Have a Medical Advance Directive? No Patient does not have advance directive  Would patient like information on creating a medical advance directive? No - Patient declined -    Current Medications (verified) Outpatient Encounter Medications as of 09/14/2020  Medication Sig  . alendronate (FOSAMAX) 70 MG tablet Take 1 tablet (70 mg total) by mouth once a week. Take with a full glass of water on an empty stomach.  . busPIRone (BUSPAR) 5 MG tablet Take 1 tablet (5 mg total) by mouth 2 (two) times daily.  Marland Kitchen. darifenacin (ENABLEX) 15 MG 24 hr tablet Take 15 mg by mouth daily.  Marland Kitchen. escitalopram (LEXAPRO) 20 MG tablet Take 20 mg by mouth at bedtime.   . fentaNYL (DURAGESIC) 25 MCG/HR patch Place 1 patch (25 mcg total) onto the skin every 3 (three) days.  . fesoterodine (TOVIAZ) 4 MG TB24 tablet Take 1 tablet (4 mg total) by mouth daily.  . furosemide (LASIX) 40 MG tablet Take 1 tablet (40 mg total) by mouth 2 (two) times daily.  Marland Kitchen. glipiZIDE (GLUCOTROL XL) 5 MG 24 hr tablet Take 5 mg by mouth daily with breakfast.  . hydroxychloroquine (PLAQUENIL) 200 MG tablet Take 1 tablet (200 mg total) by mouth daily.  Marland Kitchen. lisinopril (ZESTRIL) 2.5 MG tablet Take 1 tablet (2.5 mg total) by mouth daily.  . magic mouthwash w/lidocaine SOLN Take 5 mLs by mouth 2 (two) times daily as needed for mouth pain.  Marland Kitchen. menthol-cetylpyridinium (CEPACOL) 3 MG lozenge Take 1 lozenge (3 mg total) by mouth as needed for sore throat.  . methocarbamol (ROBAXIN) 500 MG tablet Take 500  mg by mouth every 6 (six) hours as needed for muscle spasms.  Marland Kitchen. oxycodone (OXY-IR) 5 MG capsule Take 1 capsule (5 mg total) by mouth 4 (four) times daily as needed for pain.  . pantoprazole (PROTONIX) 40 MG tablet Take 40 mg by mouth 2 (two) times daily.  . potassium chloride SA (KLOR-CON) 20 MEQ tablet Take 1 tablet (20 mEq total) by mouth 2 (two) times daily.  . pregabalin (LYRICA) 50 MG capsule Take 1 capsule (50 mg total) by mouth at bedtime.  . simvastatin (ZOCOR) 40 MG tablet Take 1 tablet (40 mg total) by mouth at bedtime.  . sodium bicarbonate 650 MG tablet TAKE (1) TABLET BY MOUTH TWICE DAILY.  . traMADol (ULTRAM) 50 MG tablet Take 1 tablet (50 mg total) by mouth every 6 (six) hours as needed.  . Vitamin D, Ergocalciferol, (DRISDOL) 1.25 MG (50000 UNIT) CAPS capsule Take 1 capsule (50,000 Units total) by mouth every 7 (seven) days. *take once weekly on the same day each week*  . [DISCONTINUED] furosemide (LASIX) 40 MG tablet Take 1 tablet (40 mg total) by mouth 2 (two) times daily.  . [DISCONTINUED] hydroxychloroquine (PLAQUENIL) 200 MG tablet Take 1 tablet (200 mg total) by mouth daily.  . [DISCONTINUED] lisinopril (ZESTRIL) 2.5 MG tablet Take 1 tablet (2.5 mg total) by mouth daily.  . [DISCONTINUED] potassium chloride SA (KLOR-CON) 20 MEQ tablet  Take 1 tablet (20 mEq total) by mouth 2 (two) times daily.  . [DISCONTINUED] sodium bicarbonate 650 MG tablet TAKE (1) TABLET BY MOUTH TWICE DAILY.  . [DISCONTINUED] TOVIAZ 4 MG TB24 tablet TAKE ONE TABLET BY MOUTH ONCE DAILY.   No facility-administered encounter medications on file as of 09/14/2020.    Allergies (verified) Darvon [propoxyphene], Morphine and related, and Penicillins   History: Past Medical History:  Diagnosis Date  . Arthritis   . Coronary artery disease   . Diabetes mellitus without complication (HCC)   . Encephalopathy 03/02/2014  . Hyperlipidemia   . Hypertension   . Osteoporosis   . Referred otalgia of right ear  02/11/2017  . Renal disorder   . Sarcoidosis   . Throat pain 03/03/2020   Past Surgical History:  Procedure Laterality Date  . ANKLE SURGERY    . BACK SURGERY    . FRACTURE SURGERY     tibia    Family History  Problem Relation Age of Onset  . Hypertension Mother   . Hyperlipidemia Mother   . Throat cancer Father    Social History   Socioeconomic History  . Marital status: Married    Spouse name: Christen BameRonnie   . Number of children: 3  . Years of education: Not on file  . Highest education level: Not on file  Occupational History  . Not on file  Tobacco Use  . Smoking status: Never Smoker  . Smokeless tobacco: Never Used  Substance and Sexual Activity  . Alcohol use: No  . Drug use: No  . Sexual activity: Not Currently  Other Topics Concern  . 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 Difficulty of Paying Living Expenses: Not hard at all  Food Insecurity: No Food Insecurity  . Worried About Programme researcher, broadcasting/film/videounning Out of Food in the Last Year: Never true  . Ran Out of Food in the Last Year: Never true  Transportation Needs: No Transportation Needs  . Lack of Transportation (Medical): No  . Lack of Transportation (Non-Medical): No  Physical Activity: Inactive  . Days of Exercise per Week: 0 days  . Minutes of Exercise per Session: 0 min  Stress: No Stress Concern Present  . Feeling of Stress : Only a little  Social Connections: Socially Isolated  . Frequency of Communication with Friends and Family: Once a week  . Frequency of Social Gatherings with  Friends and Family: Once a week  . Attends Religious Services: Never  . Active Member of Clubs or Organizations: No  . Attends BankerClub or Organization Meetings: Never  . Marital Status: Married    Tobacco Counseling Counseling given: Not Answered   Clinical Intake:  Pre-visit preparation completed: Yes  Pain : 0-10 Pain Score: 2  Pain Type: Chronic pain Pain Location: Back     Nutritional Status: BMI > 30  Obese Diabetes: Yes CBG done?: No Did  pt. bring in CBG monitor from home?: No  How often do you need to have someone help you when you read instructions, pamphlets, or other written materials from your doctor or pharmacy?: 1 - Never  Diabetic? yes  Interpreter Needed?: No  Information entered by :: Everitt Amber LPN   Activities of Daily Living In your present state of health, do you have any difficulty performing the following activities: 09/14/2020 12/02/2019  Hearing? N N  Vision? N N  Difficulty concentrating or making decisions? N N  Walking or climbing stairs? Y N  Dressing or bathing? N N  Doing errands, shopping? N N  Preparing Food and eating ? N -  Using the Toilet? N -  In the past six months, have you accidently leaked urine? N -  Do you have problems with loss of bowel control? N -  Managing your Medications? N -  Managing your Finances? N -  Housekeeping or managing your Housekeeping? N -  Some recent data might be hidden    Patient Care Team: Freddy Finner, NP as PCP - General (Family Medicine)  Indicate any recent Medical Services you may have received from other than [facility] providers in the past year (date may be approximate).     Assessment:   This is a routine wellness examination for [name].  Hearing/Vision screen No exam data present  Dietary issues and exercise activities discussed: Current Exercise Habits: Home exercise routine;The patient does not participate in regular exercise at present, Exercise limited by: orthopedic  condition(s)  Goals    . Increase physical activity     Try to go for short walks as tolerated       Depression Screen PHQ 2/9 Scores 09/14/2020 09/13/2020 03/03/2020 12/02/2019  PHQ - 2 Score 0 1 3 5   PHQ- 9 Score - - 6 10    Fall Risk Fall Risk  09/14/2020 09/13/2020 03/03/2020 12/02/2019  Falls in the past year? 0 0 0 0  Number falls in past yr: 0 0 0 0  Injury with Fall? 0 0 0 0  Risk for fall due to : - No Fall Risks No Fall Risks -  Follow up - Falls evaluation completed Falls evaluation completed -    FALL RISK PREVENTION PERTAINING TO THE HOME:  Any stairs in or around the home? No  If so, are there any without handrails? No  Home free of loose throw rugs in walkways, pet beds, electrical cords, etc? No  Adequate lighting in your home to reduce risk of falls? No   ASSISTIVE DEVICES UTILIZED TO PREVENT FALLS:  Life alert? No  Use of a cane, walker or w/c? No  Grab bars in the bathroom? Yes  Shower chair or bench in shower? Yes  Elevated toilet seat or a handicapped toilet? Yes   TIMED UP AND GO:  Was the test performed? No .  Length of time to ambulate 10 feet: Visit done virtually so not completed    Cognitive Function:     6CIT Screen 09/14/2020  What Year? 0 points  What month? 0 points  What time? 0 points  Count back from 20 0 points  Months in reverse 2 points  Repeat phrase 0 points  Total Score 2    Immunizations Immunization History  Administered Date(s) Administered  . Fluad Quad(high Dose 65+) 07/27/2020  . Influenza-Unspecified 08/02/2016, 06/26/2017, 07/03/2018  . Moderna Sars-Covid-2 Vaccination 10/27/2019, 11/24/2019  . Pneumococcal Conjugate-13 12/29/2013  . Pneumococcal Polysaccharide-23 08/02/2016  .  Zoster 06/15/2011  . Zoster Recombinat (Shingrix) 07/03/2018    TDAP status: Due, Education has been provided regarding the importance of this vaccine. Advised may receive this vaccine at local pharmacy or Health Dept. Aware to provide a  copy of the vaccination record if obtained from local pharmacy or Health Dept. Verbalized acceptance and understanding.  Flu Vaccine status: Up to date  Pneumococcal vaccine status: Up to date  Covid-19 vaccine status: Completed vaccines  Qualifies for Shingles Vaccine? Yes   Zostavax completed Yes   06/15/2011 Shingrix Completed?: Yes  Screening Tests Health Maintenance  Topic Date Due  . FOOT EXAM  Never done  . OPHTHALMOLOGY EXAM  Never done  . COLONOSCOPY (Pts 45-35yrs Insurance coverage will need to be confirmed)  Never done  . COVID-19 Vaccine (3 - Booster for Moderna series) 05/26/2020  . MAMMOGRAM  07/17/2020  . HEMOGLOBIN A1C  08/27/2020  . TETANUS/TDAP  12/01/2020 (Originally 05/19/1965)  . Hepatitis C Screening  12/01/2020 (Originally 08/23/1946)  . INFLUENZA VACCINE  Completed  . DEXA SCAN  Completed  . PNA vac Low Risk Adult  Completed    Health Maintenance  Health Maintenance Due  Topic Date Due  . FOOT EXAM  Never done  . OPHTHALMOLOGY EXAM  Never done  . COLONOSCOPY (Pts 45-67yrs Insurance coverage will need to be confirmed)  Never done  . COVID-19 Vaccine (3 - Booster for Moderna series) 05/26/2020  . MAMMOGRAM  07/17/2020  . HEMOGLOBIN A1C  08/27/2020    Colorectal cancer screening: Type of screening: Colonoscopy. Completed overdue. Repeat every 5 years  Mammogram status: Ordered today. Pt provided with contact info and advised to call to schedule appt.    Bone Density status: Completed 07/16/2018. Results reflect: Bone density results: OSTEOPENIA. Repeat every 3 years.  Lung Cancer Screening: (Low Dose CT Chest recommended if Age 64-80 years, 30 pack-year currently smoking OR have quit w/in 15years.) does not qualify.   Lung Cancer Screening Referral: no  Additional Screening:  Hepatitis C Screening: does qualify; Completed.   Vision Screening: Recommended annual ophthalmology exams for early detection of glaucoma and other disorders of the  eye. Is the patient up to date with their annual eye exam?  Yes    Who is the provider or what is the name of the office in which the patient attends annual eye exams?   If pt is not established with a provider, would they like to be referred to a provider to establish care? No .   Dental Screening: Recommended annual dental exams for proper oral hygiene  Community Resource Referral / Chronic Care Management: CRR required this visit?  No   CCM required this visit?  No      Plan:     I have personally reviewed and noted the following in the patient's chart:   . Medical and social history . Use of alcohol, tobacco or illicit drugs  . Current medications and supplements . Functional ability and status . Nutritional status . Physical activity . Advanced directives . List of other physicians . Hospitalizations, surgeries, and ER visits in previous 12 months . Vitals . Screenings to include cognitive, depression, and falls . Referrals and appointments  In addition, I have reviewed and discussed with patient certain preventive protocols, quality metrics, and best practice recommendations. A written personalized care plan for preventive services as well as general preventive health recommendations were provided to patient.     Everitt Amber, LPN, LPN   [id number]   Nurse  Notes: AWV conducted over the phone with pt consent to televisit via audio. Pt was present in the home at the time of call and provider in the office. Call took approx 15 min.

## 2020-09-22 ENCOUNTER — Ambulatory Visit: Payer: Medicare PPO | Admitting: Family Medicine

## 2020-10-04 ENCOUNTER — Other Ambulatory Visit: Payer: Self-pay | Admitting: Family Medicine

## 2020-10-11 ENCOUNTER — Ambulatory Visit (INDEPENDENT_AMBULATORY_CARE_PROVIDER_SITE_OTHER): Payer: HMO | Admitting: Nurse Practitioner

## 2020-10-11 ENCOUNTER — Other Ambulatory Visit: Payer: Self-pay

## 2020-10-11 ENCOUNTER — Ambulatory Visit: Payer: HMO | Admitting: Family Medicine

## 2020-10-11 ENCOUNTER — Encounter: Payer: Self-pay | Admitting: Nurse Practitioner

## 2020-10-11 DIAGNOSIS — F419 Anxiety disorder, unspecified: Secondary | ICD-10-CM

## 2020-10-11 DIAGNOSIS — N3281 Overactive bladder: Secondary | ICD-10-CM

## 2020-10-11 MED ORDER — SERTRALINE HCL 50 MG PO TABS: 50.0000 mg | ORAL_TABLET | Freq: Every day | ORAL | 3 refills | Status: DC

## 2020-10-11 NOTE — Assessment & Plan Note (Signed)
-  has been taking Gala Murdoch for 3 years -denies by new insurace -will attempt to override; if this is denies will need a formulary to make a cost-effective decision for her -will need this by 10/31/20
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
## 2020-10-11 NOTE — Addendum Note (Signed)
Addended by: Bjorn Pippin on: 10/11/2020 04:03 PM   Modules accepted: Orders

## 2020-10-11 NOTE — Assessment & Plan Note (Signed)
-  GAD-7 = 21 today,so she has severe anxiety and affect matches -Rx. Sertraline -continue buspar -we may need therapy as well, and she was informed as such -if she doesn't feel better in a month, she is amenable to meeting with therapist

## 2020-10-22 ENCOUNTER — Ambulatory Visit (INDEPENDENT_AMBULATORY_CARE_PROVIDER_SITE_OTHER): Payer: HMO

## 2020-10-22 ENCOUNTER — Ambulatory Visit
Admission: EM | Admit: 2020-10-22 | Discharge: 2020-10-22 | Disposition: A | Discharge status: Home / Self Care | Payer: HMO | Attending: Emergency Medicine | Admitting: Emergency Medicine

## 2020-10-22 ENCOUNTER — Other Ambulatory Visit: Payer: Self-pay

## 2020-10-22 ENCOUNTER — Encounter: Payer: Self-pay | Admitting: Emergency Medicine

## 2020-10-22 DIAGNOSIS — Z1152 Encounter for screening for COVID-19: Secondary | ICD-10-CM | POA: Diagnosis not present

## 2020-10-22 DIAGNOSIS — M549 Dorsalgia, unspecified: Secondary | ICD-10-CM | POA: Diagnosis not present

## 2020-10-22 DIAGNOSIS — R0602 Shortness of breath: Secondary | ICD-10-CM

## 2020-10-22 DIAGNOSIS — R0981 Nasal congestion: Secondary | ICD-10-CM

## 2020-10-22 DIAGNOSIS — R0781 Pleurodynia: Secondary | ICD-10-CM

## 2020-10-22 MED ORDER — NEOMYCIN-POLYMYXIN-HC 3.5-10000-1 OT SUSP: 4.0000 [drp] | Freq: Three times a day (TID) | OTIC | 0 refills | Status: DC

## 2020-10-22 MED ORDER — CETIRIZINE HCL 5 MG PO TABS: 5.0000 mg | ORAL_TABLET | Freq: Every day | ORAL | 0 refills | Status: DC

## 2020-10-22 MED ORDER — ALBUTEROL SULFATE HFA 108 (90 BASE) MCG/ACT IN AERS: 1.0000 | INHALATION_SPRAY | Freq: Four times a day (QID) | RESPIRATORY_TRACT | 0 refills | Status: DC | PRN

## 2020-10-22 NOTE — Discharge Instructions (Signed)
COVID testing ordered.  It will take between 2-7 days for test results.  Someone will contact you regarding abnormal results.    Get plenty of rest and push fluids Zyrtec prescribed for nasal congestion Albuterol prescribed for shortness of breath Cortisporin as prescribed for ear pain Continue to use nasal spray as prescribed and directed Use OTC medications like ibuprofen or tylenol as needed fever or pain Call or go to the ED if you have any new or worsening symptoms such as fever, worsening cough, shortness of breath, chest tightness, chest pain, turning blue, changes in mental status, etc..Marland Kitchen

## 2020-10-22 NOTE — ED Provider Notes (Signed)
[facility]   [id number] 10/22/20 Arrival Time: 1305   CC: SOB  SUBJECTIVE: History from: patient.  [name] is a 75 y.o. female who presented to the urgent care with a complaint of shortness of breath, nasal congestion, ear pain, headache and right posterior upper rib cage pain for the past few days.  Denies sick exposure to COVID, flu or strep.  Denies recent travel.  Has tried OTC Flonase without relief.  Report rib cage pain is worse with respiration and movement.  Denies previous symptoms in the past.   Denies fever, chills, fatigue, sinus pain, rhinorrhea, sore throat,  wheezing, chest pain, nausea, changes in bowel or bladder habits.     ROS: As per HPI.  All other pertinent ROS negative.     Past Medical History:  Diagnosis Date  . Arthritis   . Coronary artery disease   . Diabetes mellitus without complication (HCC)   . Encephalopathy 03/02/2014  . Hyperlipidemia   . Hypertension   . Osteoporosis   . Referred otalgia of right ear 02/11/2017  . Renal disorder   . Sarcoidosis   . Throat pain 03/03/2020   Past Surgical History:  Procedure Laterality Date  . ANKLE SURGERY    . BACK SURGERY    . FRACTURE SURGERY     tibia    Allergies  Allergen Reactions  . Darvon [Propoxyphene] Nausea And Vomiting  . Morphine And Related Itching and Rash  . Penicillins Itching and Rash    Pt denies this allergy   No current facility-administered medications on file prior to encounter.   Current Outpatient Medications on File Prior to Encounter  Medication Sig Dispense Refill  . alendronate (FOSAMAX) 70 MG tablet TAKE 1 TABLET BY MOUTH ONCE WEEKLY. 4 tablet 0  . busPIRone (BUSPAR) 5 MG tablet Take 1 tablet (5 mg total) by mouth 2 (two) times daily. 60 tablet 1  . darifenacin (ENABLEX) 15 MG 24 hr tablet Take 15 mg by mouth daily.    . fentaNYL (DURAGESIC) 25 MCG/HR patch Place 1 patch (25 mcg total) onto the skin every 3 (three) days. 5 patch 0  . fesoterodine  (TOVIAZ) 4 MG TB24 tablet Take 1 tablet (4 mg total) by mouth daily. 30 tablet 3  . furosemide (LASIX) 40 MG tablet Take 1 tablet (40 mg total) by mouth 2 (two) times daily. 30 tablet 3  . glipiZIDE (GLUCOTROL XL) 5 MG 24 hr tablet Take 5 mg by mouth daily with breakfast.    . hydroxychloroquine (PLAQUENIL) 200 MG tablet Take 1 tablet (200 mg total) by mouth daily. 30 tablet 3  . lisinopril (ZESTRIL) 2.5 MG tablet Take 1 tablet (2.5 mg total) by mouth daily. 30 tablet 0  . magic mouthwash w/lidocaine SOLN Take 5 mLs by mouth 2 (two) times daily as needed for mouth pain. 50 mL 0  . menthol-cetylpyridinium (CEPACOL) 3 MG lozenge Take 1 lozenge (3 mg total) by mouth as needed for sore throat. 100 tablet 1  . methocarbamol (ROBAXIN) 500 MG tablet Take 500 mg by mouth every 6 (six) hours as needed for muscle spasms.    Marland Kitchen oxycodone (OXY-IR) 5 MG capsule Take 1 capsule (5 mg total) by mouth 4 (four) times daily as needed for pain. 30 capsule 0  . pantoprazole (PROTONIX) 40 MG tablet Take 40 mg by mouth 2 (two) times daily.    . potassium chloride SA (KLOR-CON) 20 MEQ tablet Take 1 tablet (20 mEq total) by mouth 2 (  two) times daily. 60 tablet 3  . pregabalin (LYRICA) 50 MG capsule Take 1 capsule (50 mg total) by mouth at bedtime. 30 capsule 1  . sertraline (ZOLOFT) 50 MG tablet Take 1 tablet (50 mg total) by mouth daily. Stop escitalopram when starting this. 30 tablet 3  . simvastatin (ZOCOR) 40 MG tablet Take 1 tablet (40 mg total) by mouth at bedtime. 30 tablet 5  . sodium bicarbonate 650 MG tablet TAKE (1) TABLET BY MOUTH TWICE DAILY. 60 tablet 3  . traMADol (ULTRAM) 50 MG tablet Take 1 tablet (50 mg total) by mouth every 6 (six) hours as needed. 15 tablet 0  . Vitamin D, Ergocalciferol, (DRISDOL) 1.25 MG (50000 UNIT) CAPS capsule TAKE 1 CAPSULE THE SAME DAY EACH WEEK. 5 capsule 0   Social History   Socioeconomic History  . Marital status: Married    Spouse name: Christen Bame   . Number of children: 3   . Years of education: Not on file  . Highest education level: Not on file  Occupational History  . Not on file  Tobacco Use  . Smoking status: Never Smoker  . Smokeless tobacco: Never Used  Substance and Sexual Activity  . Alcohol use: No  . Drug use: No  . Sexual activity: Not Currently  Other Topics Concern  . 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 Difficulty of Paying Living Expenses: Not hard at all  Food Insecurity: No Food Insecurity  . Worried About Programme researcher, broadcasting/film/video in the Last Year: Never true  . Ran Out of Food in the Last Year: Never true  Transportation Needs: No Transportation Needs  . Lack of Transportation (Medical): No  . Lack of Transportation (Non-Medical): No  Physical Activity: Inactive  . Days of Exercise per Week: 0 days  . Minutes of Exercise per Session: 0 min  Stress: No Stress Concern Present  . Feeling of Stress : Only a little  Social Connections: Socially Isolated  . Frequency of Communication with Friends and Family: Once a week  . Frequency of Social Gatherings with Friends and Family: Once a week  . Attends Religious Services: Never  . Active Member of Clubs or Organizations: No  . Attends Banker Meetings: Never  . Marital Status: Married  Catering manager Violence: Not At Risk  . Fear of Current or Ex-Partner: No  . Emotionally Abused: No  . Physically Abused: No  . Sexually Abused: No   Family History  Problem Relation Age of Onset  . Hypertension Mother   . Hyperlipidemia Mother    . Throat cancer Father     OBJECTIVE:  Vitals:   10/22/20 1314  BP: 128/80  Pulse: 93  Resp: 18  Temp: 98.5 F (36.9 C)  TempSrc: Oral  SpO2: 98%     General appearance: alert; appears fatigued, but nontoxic; speaking in full sentences and  tolerating own secretions HEENT: NCAT; Ears: EACs clear, left TMs pearly gray, right TM with middle ear effusion; Eyes: PERRL.  EOM grossly intact. Sinuses: nontender; Nose: nares patent without rhinorrhea, Throat: oropharynx clear, tonsils non erythematous or enlarged, uvula midline  Neck: supple without LAD Lungs: unlabored respirations, symmetrical air entry; cough: absent; no respiratory distress; CTAB Heart: regular rate and rhythm.  Radial pulses 2+ symmetrical bilaterally Skin: warm and dry Psychological: alert and cooperative; normal mood and affect  LABS:  No results found for this or any previous visit (from the past 24 hour(s)).   RADIOLOGY  DG Chest 2 View  Result Date: 10/22/2020 CLINICAL DATA:  Shortness of breath, back pain on inspiration today. EXAM: CHEST - 2 VIEW COMPARISON:  Chest radiograph March 02, 2014. FINDINGS: The heart size and mediastinal contours are within normal limits. Similar right infrahilar scarring. No focal consolidation. No pleural effusion. No pneumothorax. The visualized skeletal structures are unremarkable. IMPRESSION: No active cardiopulmonary disease. Electronically Signed   By: Maudry Mayhew MD   On: 10/22/2020 13:56   Chest X-ray is negative for cardiopulmonary abnormality.  I have reviewed the x-ray myself and the radiologist interpretation.  I am in agreement with the radiologist interpretation.   ASSESSMENT & PLAN:  1. Encounter for screening for COVID-19   2. SOB (shortness of breath)   3. Painful rib   4. Nasal congestion     Meds ordered this encounter  Medications  . neomycin-polymyxin-hydrocortisone (CORTISPORIN) 3.5-10000-1 OTIC suspension    Sig: Place 4 drops into the right ear  3 (three) times daily.    Dispense:  10 mL    Refill:  0  . albuterol (VENTOLIN HFA) 108 (90 Base) MCG/ACT inhaler    Sig: Inhale 1-2 puffs into the lungs every 6 (six) hours as needed for wheezing or shortness of breath.    Dispense:  18 g    Refill:  0  . cetirizine (ZYRTEC) 5 MG tablet    Sig: Take 1 tablet (5 mg total) by mouth daily.    Dispense:  30 tablet    Refill:  0    Discharge Instructions    COVID testing ordered.  It will take between 2-7 days for test results.  Someone will contact you regarding abnormal results.    Get plenty of rest and push fluids Zyrtec prescribed for nasal congestion Albuterol prescribed for shortness of breath Cortisporin as prescribed for ear pain Continue to use nasal spray as prescribed and directed Use OTC medications like ibuprofen or tylenol as needed fever or pain Call or go to the ED if you have any new or worsening symptoms such as fever, worsening cough, shortness of breath, chest tightness, chest pain, turning blue, changes in mental status, etc...   Reviewed expectations re: course of current medical issues. Questions answered. Outlined signs and symptoms indicating need for more acute intervention. Patient verbalized understanding. After Visit Summary given.         Durward Parcel, FNP 10/22/20 1431

## 2020-10-22 NOTE — ED Triage Notes (Addendum)
Pain when breathing in her mid back that started this morning.  Pt states her husband said the area is swollen. Reports chills that started last night. Also having headache and ear pain.  Does not want nose spray, states it does not work.

## 2020-10-23 LAB — COVID-19, FLU A+B NAA
Influenza A, NAA: NOT DETECTED
Influenza B, NAA: NOT DETECTED
SARS-CoV-2, NAA: NOT DETECTED

## 2020-11-04 ENCOUNTER — Other Ambulatory Visit: Payer: Self-pay | Admitting: Family Medicine

## 2020-11-07 ENCOUNTER — Other Ambulatory Visit: Payer: Self-pay | Admitting: Family Medicine

## 2020-11-07 DIAGNOSIS — F419 Anxiety disorder, unspecified: Secondary | ICD-10-CM

## 2020-11-08 ENCOUNTER — Other Ambulatory Visit: Payer: Self-pay

## 2020-11-08 ENCOUNTER — Ambulatory Visit (INDEPENDENT_AMBULATORY_CARE_PROVIDER_SITE_OTHER): Payer: HMO | Admitting: Nurse Practitioner

## 2020-11-08 ENCOUNTER — Encounter: Payer: Self-pay | Admitting: Nurse Practitioner

## 2020-11-08 DIAGNOSIS — F419 Anxiety disorder, unspecified: Secondary | ICD-10-CM | POA: Diagnosis not present

## 2020-11-08 MED ORDER — SERTRALINE HCL 100 MG PO TABS
100.0000 mg | ORAL_TABLET | Freq: Every day | ORAL | 1 refills | Status: DC
Start: 2020-11-08 — End: 2020-12-08

## 2020-11-08 NOTE — Progress Notes (Signed)
Acute Office Visit  Subjective:    Patient ID: [name], female    DOB: 06/01/1946, 75 y.o.   MRN: [id number]  Chief Complaint  Patient presents with   Anxiety    HPI Patient is in today for medication check. At her visit on 10/11/20 her Gala Murdoch had been discontinued by her insurance, so we tried to do a prior-auth on this.  She also had severe anxiety with GAD-7 = 21, and she was started on sertraline. She wanted to try medication before going to therapy. She is having issues processing the death of her mother and the circumstances surrounding her death.  Today, she states she was able to get her Toviaz filled without issue. She states her copay is $100, but that is ok.   She states she is having tremors all the time.  Past Medical History:  Diagnosis Date   Arthritis    Coronary artery disease    Diabetes mellitus without complication (HCC)    Encephalopathy 03/02/2014   Hyperlipidemia    Hypertension    Osteoporosis    Referred otalgia of right ear 02/11/2017   Renal disorder    Sarcoidosis    Throat pain 03/03/2020    Past Surgical History:  Procedure Laterality Date   ANKLE SURGERY     BACK SURGERY     FRACTURE SURGERY     tibia     Family History  Problem Relation Age of Onset   Hypertension Mother    Hyperlipidemia Mother    Throat cancer Father     Social History   Socioeconomic History   Marital status: Married    Spouse name: Ronnie    Number of children: 3   Years of education: Not on file   Highest education level: Not on file  Occupational History   Not on file  Tobacco Use   Smoking status: Never Smoker   Smokeless tobacco: Never Used  Substance and Sexual Activity   Alcohol use: No   Drug use: No   Sexual activity: Not Currently  Other Topics Concern   Not on file  Social History Narrative   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)   Lives with husband-Ronnie    Has 3 grown children: two live in town and one  out of town   10 grandbabies      Enjoys: reading, talk with friends, church-sing in choir       Diet: eats all food groups   Caffeine: none     Water: 4 cups daily      Wears seat belt   Does not use phone while driving   Psychologist, sport and exercise at home   Social Determinants of Corporate investment banker Strain: Low Risk    Difficulty of Paying Living Expenses: Not hard at all  Food Insecurity: No Food Insecurity   Worried About Programme researcher, broadcasting/film/video in the Last Year: Never true   Barista in the Last Year: Never true  Transportation Needs: No Transportation Needs   Lack of Transportation (Medical): No   Lack of Transportation (Non-Medical): No  Physical Activity: Inactive   Days of Exercise per Week: 0 days   Minutes of Exercise per Session: 0 min  Stress: No Stress Concern Present   Feeling of Stress : Only a little  Social Connections: Socially Isolated   Frequency of Communication with Friends and Family: Once a week   Frequency of Social Gatherings with Friends and Family: Once  a week   Attends Religious Services: Never   Active Member of Clubs or Organizations: No   Attends Banker Meetings: Never   Marital Status: Married  Catering manager Violence: Not At Risk   Fear of Current or Ex-Partner: No   Emotionally Abused: No   Physically Abused: No   Sexually Abused: No    Outpatient Medications Prior to Visit  Medication Sig Dispense Refill   albuterol (VENTOLIN HFA) 108 (90 Base) MCG/ACT inhaler Inhale 1-2 puffs into the lungs every 6 (six) hours as needed for wheezing or shortness of breath. 18 g 0   alendronate (FOSAMAX) 70 MG tablet TAKE 1 TABLET BY MOUTH ONCE WEEKLY. 4 tablet 0   busPIRone (BUSPAR) 5 MG tablet TAKE (1) TABLET BY MOUTH TWICE DAILY. 60 tablet 0   cetirizine (ZYRTEC) 5 MG tablet Take 1 tablet (5 mg total) by mouth daily. 30 tablet 0   darifenacin (ENABLEX) 15 MG 24 hr tablet Take 15 mg by mouth daily.      fentaNYL (DURAGESIC) 25 MCG/HR patch Place 1 patch (25 mcg total) onto the skin every 3 (three) days. 5 patch 0   fesoterodine (TOVIAZ) 4 MG TB24 tablet Take 1 tablet (4 mg total) by mouth daily. 30 tablet 3   furosemide (LASIX) 40 MG tablet Take 1 tablet (40 mg total) by mouth 2 (two) times daily. 30 tablet 3   glipiZIDE (GLUCOTROL XL) 5 MG 24 hr tablet Take 5 mg by mouth daily with breakfast.     hydroxychloroquine (PLAQUENIL) 200 MG tablet Take 1 tablet (200 mg total) by mouth daily. 30 tablet 3   lisinopril (ZESTRIL) 2.5 MG tablet TAKE ONE TABLET BY MOUTH ONCE DAILY FOR BLOOD PRESSURE. 30 tablet 0   magic mouthwash w/lidocaine SOLN Take 5 mLs by mouth 2 (two) times daily as needed for mouth pain. 50 mL 0   menthol-cetylpyridinium (CEPACOL) 3 MG lozenge Take 1 lozenge (3 mg total) by mouth as needed for sore throat. 100 tablet 1   methocarbamol (ROBAXIN) 500 MG tablet Take 500 mg by mouth every 6 (six) hours as needed for muscle spasms.     neomycin-polymyxin-hydrocortisone (CORTISPORIN) 3.5-10000-1 OTIC suspension Place 4 drops into the right ear 3 (three) times daily. 10 mL 0   oxycodone (OXY-IR) 5 MG capsule Take 1 capsule (5 mg total) by mouth 4 (four) times daily as needed for pain. 30 capsule 0   pantoprazole (PROTONIX) 40 MG tablet Take 40 mg by mouth 2 (two) times daily.     potassium chloride SA (KLOR-CON) 20 MEQ tablet Take 1 tablet (20 mEq total) by mouth 2 (two) times daily. 60 tablet 3   pregabalin (LYRICA) 50 MG capsule Take 1 capsule (50 mg total) by mouth at bedtime. 30 capsule 1   simvastatin (ZOCOR) 40 MG tablet Take 1 tablet (40 mg total) by mouth at bedtime. 30 tablet 5   sodium bicarbonate 650 MG tablet TAKE (1) TABLET BY MOUTH TWICE DAILY. 60 tablet 3   traMADol (ULTRAM) 50 MG tablet Take 1 tablet (50 mg total) by mouth every 6 (six) hours as needed. 15 tablet 0   Vitamin D, Ergocalciferol, (DRISDOL) 1.25 MG (50000 UNIT) CAPS capsule TAKE 1 CAPSULE THE SAME  DAY EACH WEEK. 5 capsule 0   sertraline (ZOLOFT) 50 MG tablet Take 1 tablet (50 mg total) by mouth daily. Stop escitalopram when starting this. 30 tablet 3   No facility-administered medications prior to visit.    Allergies  Allergen  Reactions   Darvon [Propoxyphene] Nausea And Vomiting   Morphine And Related Itching and Rash   Penicillins Itching and Rash    Pt denies this allergy    Review of Systems  Constitutional: Negative.   Respiratory: Negative.   Cardiovascular: Negative.   Neurological: Positive for tremors.  Psychiatric/Behavioral: Negative for self-injury and suicidal ideas. The patient is nervous/anxious.        Objective:    Physical Exam Constitutional:      Appearance: Normal appearance.  Cardiovascular:     Rate and Rhythm: Normal rate and regular rhythm.     Pulses: Normal pulses.     Heart sounds: Normal heart sounds.  Pulmonary:     Effort: Pulmonary effort is normal.     Breath sounds: Normal breath sounds.  Neurological:     General: No focal deficit present.     Mental Status: She is alert and oriented to person, place, and time.     Comments: No tremor observed with outstretched hands  Psychiatric:     Comments: Anxious affect; tearful when discussing the circumstances around her mother's passing; has some resentments towards her sister who lived with her mother regarding her mother's death     BP 131/88    Pulse 96    Temp 98.3 F (36.8 C)    Resp 20    Ht 5\' 8"  (1.727 m)    Wt 206 lb (93.4 kg)    SpO2 93%    BMI 31.32 kg/m  Wt Readings from Last 3 Encounters:  11/08/20 206 lb (93.4 kg)  10/11/20 205 lb (93 kg)  09/13/20 200 lb (90.7 kg)    Health Maintenance Due  Topic Date Due   FOOT EXAM  Never done   OPHTHALMOLOGY EXAM  Never done   COLONOSCOPY (Pts 45-54yrs Insurance coverage will need to be confirmed)  Never done   COVID-19 Vaccine (3 - Booster for Moderna series) 05/26/2020   MAMMOGRAM  07/17/2020   HEMOGLOBIN A1C   08/27/2020    There are no preventive care reminders to display for this patient.   Lab Results  Component Value Date   TSH 3.19 07/03/2018   Lab Results  Component Value Date   WBC 4.8 02/25/2020   HGB 13.1 02/25/2020   HCT 41.4 02/25/2020   MCV 87.7 02/25/2020   PLT 203 02/25/2020   Lab Results  Component Value Date   NA 142 02/25/2020   K 3.4 (L) 02/25/2020   CO2 30 02/25/2020   GLUCOSE 112 (H) 02/25/2020   BUN 16 02/25/2020   CREATININE 1.41 (H) 02/25/2020   BILITOT 0.5 02/25/2020   ALKPHOS 52 07/03/2018   AST 15 02/25/2020   ALT 9 02/25/2020   PROT 6.9 02/25/2020   ALBUMIN 4.2 07/03/2018   CALCIUM 9.6 02/25/2020   ANIONGAP 7 01/18/2017   Lab Results  Component Value Date   CHOL 211 (H) 02/25/2020   Lab Results  Component Value Date   HDL 62 02/25/2020   Lab Results  Component Value Date   LDLCALC 121 (H) 02/25/2020   Lab Results  Component Value Date   TRIG 167 (H) 02/25/2020   Lab Results  Component Value Date   CHOLHDL 3.4 02/25/2020   Lab Results  Component Value Date   HGBA1C 5.6 02/25/2020       Assessment & Plan:   Problem List Items Addressed This Visit      Other   Anxiety    -INCREASE sertraline to 100  mg -GAD-7 improved from 21 to 15 today -we discussed therapy, but she is not interested in this today      Relevant Medications   sertraline (ZOLOFT) 100 MG tablet       Meds ordered this encounter  Medications   sertraline (ZOLOFT) 100 MG tablet    Sig: Take 1 tablet (100 mg total) by mouth daily. Stop escitalopram when starting this.    Dispense:  30 tablet    Refill:  1    Please deliver her meds; INCREASED dose today     Heather RobertsJOSEPH M Janesa Dockery, NP

## 2020-11-08 NOTE — Assessment & Plan Note (Signed)
-  INCREASE sertraline to 100 mg -GAD-7 improved from 21 to 15 today -we discussed therapy, but she is not interested in this today

## 2020-11-14 ENCOUNTER — Telehealth: Payer: Self-pay

## 2020-11-14 ENCOUNTER — Other Ambulatory Visit: Payer: Self-pay | Admitting: Nurse Practitioner

## 2020-11-14 MED ORDER — OXYBUTYNIN CHLORIDE 5 MG PO TABS: 5.0000 mg | ORAL_TABLET | Freq: Three times a day (TID) | ORAL | 1 refills | Status: DC

## 2020-11-14 NOTE — Telephone Encounter (Signed)
Pt called and states that her insurance will no longer cover her Toviaz. She called and states the alternative will be Oxybutynin Chloride. She will need a refill on 12/01/20 and at this time requests that the new medication be sent in the place of her Toviaz.

## 2020-11-14 NOTE — Telephone Encounter (Signed)
I stopped the darifenacin and the toviaz, and I sent in oxybutynin.

## 2020-11-15 NOTE — Telephone Encounter (Signed)
Pt informed

## 2020-11-17 ENCOUNTER — Other Ambulatory Visit: Payer: Self-pay

## 2020-11-17 ENCOUNTER — Ambulatory Visit
Admission: EM | Admit: 2020-11-17 | Discharge: 2020-11-17 | Disposition: A | Discharge status: Home / Self Care | Payer: HMO | Attending: Emergency Medicine | Admitting: Emergency Medicine

## 2020-11-17 ENCOUNTER — Encounter: Payer: Self-pay | Admitting: Emergency Medicine

## 2020-11-17 DIAGNOSIS — S86911A Strain of unspecified muscle(s) and tendon(s) at lower leg level, right leg, initial encounter: Secondary | ICD-10-CM

## 2020-11-17 DIAGNOSIS — M7989 Other specified soft tissue disorders: Secondary | ICD-10-CM

## 2020-11-17 DIAGNOSIS — M25561 Pain in right knee: Secondary | ICD-10-CM

## 2020-11-17 DIAGNOSIS — M79661 Pain in right lower leg: Secondary | ICD-10-CM

## 2020-11-17 MED ORDER — DEXAMETHASONE SODIUM PHOSPHATE 10 MG/ML IJ SOLN
10.0000 mg | Freq: Once | INTRAMUSCULAR | Status: AC
  Administered 2020-11-17: 10 mg via INTRAMUSCULAR

## 2020-11-17 MED ORDER — PREDNISONE 20 MG PO TABS: 20.0000 mg | ORAL_TABLET | Freq: Two times a day (BID) | ORAL | 0 refills | Status: AC

## 2020-11-17 NOTE — ED Triage Notes (Signed)
Right knee pain that started yesterday.  States she can hardly go up steps.  States the pain radiates down leg.

## 2020-11-17 NOTE — Discharge Instructions (Signed)
Unable to rule out blood clot in urgent care setting.  Offered patient further evaluation and management in the ED.  Patient declines at this time and would like to try outpatient therapy first.  Aware of the risk associated with this decision including missed diagnosis, organ damage, organ failure, and/or death.  Patient aware and in agreement.     Steroid shot given in office Continue conservative management of rest, ice, massage, and gentle stretches Prednisone prescribed.  Take as directed and to completion Follow up with PCP if symptoms persist Go to the ER or call 911 if you have any new or worsening symptoms (fever, chills, chest pain, shortness of breath, worsening calf pain, persistent symptoms despite treatment, no improvement in symptoms over the course of the day etc...)

## 2020-11-17 NOTE — ED Provider Notes (Signed)
[facility]   [id number] 11/17/20 Arrival Time: 1517  CC:RT knee and calf PAIN  SUBJECTIVE: History from: patient. [name] is a 75 y.o. female complains of RT knee and calf pain and swelling x 2-3 days.  Denies a precipitating event or specific injury.  Reports sitting a chair while at the dentist office.  Localizes the pain to the front of knee and calf.  Describes the pain as intermittent.  Has tried OTC medications without relief.  Symptoms are made worse with weight-bearing.  Denies similar symptoms in the past.  Denies fever, chills, erythema, ecchymosis, effusion, weakness, numbness and tingling, saddle paresthesias, loss of bowel or bladder function.      Denies SOB, recent long travel, recent surgery, hormone use, tobacco use, malignancy, pregnancy, hx of blood clot.     ROS: As per HPI.  All other pertinent ROS negative.     Past Medical History:  Diagnosis Date  . Arthritis   . Coronary artery disease   . Diabetes mellitus without complication (HCC)   . Encephalopathy 03/02/2014  . Hyperlipidemia   . Hypertension   . Osteoporosis   . Referred otalgia of right ear 02/11/2017  . Renal disorder   . Sarcoidosis   . Throat pain 03/03/2020   Past Surgical History:  Procedure Laterality Date  . ANKLE SURGERY    . BACK SURGERY    . FRACTURE SURGERY     tibia    Allergies  Allergen Reactions  . Darvon [Propoxyphene] Nausea And Vomiting  . Morphine And Related Itching and Rash  . Penicillins Itching and Rash    Pt denies this allergy   No current facility-administered medications on file prior to encounter.   Current Outpatient Medications on File Prior to Encounter  Medication Sig Dispense Refill  . albuterol (VENTOLIN HFA) 108 (90 Base) MCG/ACT inhaler Inhale 1-2 puffs into the lungs every 6 (six) hours as needed for wheezing or shortness of breath. 18 g 0  . alendronate (FOSAMAX) 70 MG tablet TAKE 1 TABLET BY MOUTH ONCE WEEKLY. 4 tablet 0  . busPIRone  (BUSPAR) 5 MG tablet TAKE (1) TABLET BY MOUTH TWICE DAILY. 60 tablet 0  . cetirizine (ZYRTEC) 5 MG tablet Take 1 tablet (5 mg total) by mouth daily. 30 tablet 0  . fentaNYL (DURAGESIC) 25 MCG/HR patch Place 1 patch (25 mcg total) onto the skin every 3 (three) days. 5 patch 0  . furosemide (LASIX) 40 MG tablet Take 1 tablet (40 mg total) by mouth 2 (two) times daily. 30 tablet 3  . glipiZIDE (GLUCOTROL XL) 5 MG 24 hr tablet Take 5 mg by mouth daily with breakfast.    . hydroxychloroquine (PLAQUENIL) 200 MG tablet Take 1 tablet (200 mg total) by mouth daily. 30 tablet 3  . lisinopril (ZESTRIL) 2.5 MG tablet TAKE ONE TABLET BY MOUTH ONCE DAILY FOR BLOOD PRESSURE. 30 tablet 0  . magic mouthwash w/lidocaine SOLN Take 5 mLs by mouth 2 (two) times daily as needed for mouth pain. 50 mL 0  . menthol-cetylpyridinium (CEPACOL) 3 MG lozenge Take 1 lozenge (3 mg total) by mouth as needed for sore throat. 100 tablet 1  . methocarbamol (ROBAXIN) 500 MG tablet Take 500 mg by mouth every 6 (six) hours as needed for muscle spasms.    Marland Kitchen neomycin-polymyxin-hydrocortisone (CORTISPORIN) 3.5-10000-1 OTIC suspension Place 4 drops into the right ear 3 (three) times daily. 10 mL 0  . oxybutynin (DITROPAN) 5 MG tablet Take 1 tablet (5 mg  total) by mouth 3 (three) times daily. 270 tablet 1  . oxycodone (OXY-IR) 5 MG capsule Take 1 capsule (5 mg total) by mouth 4 (four) times daily as needed for pain. 30 capsule 0  . pantoprazole (PROTONIX) 40 MG tablet Take 40 mg by mouth 2 (two) times daily.    . potassium chloride SA (KLOR-CON) 20 MEQ tablet Take 1 tablet (20 mEq total) by mouth 2 (two) times daily. 60 tablet 3  . pregabalin (LYRICA) 50 MG capsule Take 1 capsule (50 mg total) by mouth at bedtime. 30 capsule 1  . sertraline (ZOLOFT) 100 MG tablet Take 1 tablet (100 mg total) by mouth daily. Stop escitalopram when starting this. 30 tablet 1  . simvastatin (ZOCOR) 40 MG tablet Take 1 tablet (40 mg total) by mouth at bedtime. 30  tablet 5  . sodium bicarbonate 650 MG tablet TAKE (1) TABLET BY MOUTH TWICE DAILY. 60 tablet 3  . traMADol (ULTRAM) 50 MG tablet Take 1 tablet (50 mg total) by mouth every 6 (six) hours as needed. 15 tablet 0  . Vitamin D, Ergocalciferol, (DRISDOL) 1.25 MG (50000 UNIT) CAPS capsule TAKE 1 CAPSULE THE SAME DAY EACH WEEK. 5 capsule 0   Social History   Socioeconomic History  . Marital status: Married    Spouse name: Christen Bame   . Number of children: 3  . Years of education: Not on file  . Highest education level: Not on file  Occupational History  . Not on file  Tobacco Use  . Smoking status: Never Smoker  . Smokeless tobacco: Never Used  Substance and Sexual Activity  . Alcohol use: No  . Drug use: No  . Sexual activity: Not Currently  Other Topics Concern  . 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 Difficulty of Paying Living Expenses: Not hard at all  Food Insecurity: No Food Insecurity  . Worried About Programme researcher, broadcasting/film/video in the Last Year: Never true  . Ran Out of Food in the Last Year: Never true  Transportation Needs: No Transportation Needs  . Lack of Transportation (Medical): No  . Lack of Transportation (Non-Medical): No  Physical Activity: Inactive  . Days of Exercise per Week: 0 days  . Minutes of Exercise per Session: 0 min  Stress: No Stress Concern Present  . Feeling of Stress : Only a little  Social Connections: Socially Isolated  . Frequency of Communication with Friends and Family: Once a week  . Frequency of Social  Gatherings with Friends and Family: Once a week  . Attends Religious Services: Never  . Active Member of Clubs or Organizations: No  . Attends Banker Meetings: Never  . Marital Status: Married  Catering manager Violence: Not At Risk  . Fear of Current or Ex-Partner: No  . Emotionally Abused: No  . Physically Abused: No  . Sexually Abused: No  Family History  Problem Relation Age of Onset  . Hypertension Mother   . Hyperlipidemia Mother   . Throat cancer Father     OBJECTIVE:  Vitals:   11/17/20 1550  BP: 115/67  Pulse: 79  Resp: 18  Temp: 98.4 F (36.9 C)  TempSrc: Oral  SpO2: 93%    General appearance: ALERT; in no acute distress.  Head: NCAT Lungs: Normal respiratory effort Musculoskeletal: RT knee and calf Inspection: diffuse swelling about the LE Palpation: TTP over posterior RT LE; + homan's ROM: FROM active and passive Strength: discomfort with plantar flexion Skin: warm and dry Neurologic: Sitting in wheelchair; Sensation intact about the upper/ lower extremities Psychological: alert and cooperative; normal mood and affect  ASSESSMENT & PLAN:  1. Acute pain of right knee   2. Strain of right knee, initial encounter   3. Right calf pain   4. Right leg swelling     Meds ordered this encounter  Medications  . predniSONE (DELTASONE) 20 MG tablet    Sig: Take 1 tablet (20 mg total) by mouth 2 (two) times daily with a meal for 5 days.    Dispense:  10 tablet    Refill:  0    Order Specific Question:   Supervising Provider    Answer:   Eustace Moore [[id number]]  . dexamethasone (DECADRON) injection 10 mg   Unable to rule out blood clot in urgent care setting.  Offered patient further evaluation and management in the ED.  Patient declines at this time and would like to try outpatient therapy first.  Aware of the risk associated with this decision including missed diagnosis, organ damage, organ failure, and/or death.  Patient aware and in  agreement.     Steroid shot given in office Continue conservative management of rest, ice, massage, and gentle stretches Prednisone prescribed.  Take as directed and to completion Follow up with PCP if symptoms persist Go to the ER or call 911 if you have any new or worsening symptoms (fever, chills, chest pain, shortness of breath, worsening calf pain, persistent symptoms despite treatment, no improvement in symptoms over the course of the day etc...)   Reviewed expectations re: course of current medical issues. Questions answered. Outlined signs and symptoms indicating need for more acute intervention. Patient verbalized understanding. After Visit Summary given.    Rennis Harding, PA-C 11/17/20 1625

## 2020-11-19 ENCOUNTER — Emergency Department (HOSPITAL_COMMUNITY)
Admission: EM | Admit: 2020-11-19 | Discharge: 2020-11-19 | Disposition: A | Discharge status: Home / Self Care | Payer: HMO | Attending: Emergency Medicine | Admitting: Emergency Medicine

## 2020-11-19 ENCOUNTER — Other Ambulatory Visit: Payer: Self-pay

## 2020-11-19 ENCOUNTER — Emergency Department (HOSPITAL_COMMUNITY): Payer: HMO

## 2020-11-19 ENCOUNTER — Encounter (HOSPITAL_COMMUNITY): Payer: Self-pay | Admitting: Emergency Medicine

## 2020-11-19 DIAGNOSIS — Z7984 Long term (current) use of oral hypoglycemic drugs: Secondary | ICD-10-CM | POA: Insufficient documentation

## 2020-11-19 DIAGNOSIS — M79661 Pain in right lower leg: Secondary | ICD-10-CM | POA: Diagnosis not present

## 2020-11-19 DIAGNOSIS — I2510 Atherosclerotic heart disease of native coronary artery without angina pectoris: Secondary | ICD-10-CM | POA: Diagnosis not present

## 2020-11-19 DIAGNOSIS — I10 Essential (primary) hypertension: Secondary | ICD-10-CM | POA: Insufficient documentation

## 2020-11-19 DIAGNOSIS — E1142 Type 2 diabetes mellitus with diabetic polyneuropathy: Secondary | ICD-10-CM | POA: Insufficient documentation

## 2020-11-19 DIAGNOSIS — Z79899 Other long term (current) drug therapy: Secondary | ICD-10-CM | POA: Insufficient documentation

## 2020-11-19 DIAGNOSIS — M79604 Pain in right leg: Secondary | ICD-10-CM | POA: Insufficient documentation

## 2020-11-19 MED ORDER — OXYCODONE-ACETAMINOPHEN 5-325 MG PO TABS: 2.0000 | ORAL_TABLET | Freq: Four times a day (QID) | ORAL | 0 refills | Status: DC | PRN

## 2020-11-19 NOTE — ED Provider Notes (Signed)
[facility] EMERGENCY DEPARTMENT Provider Note   CSN: [id number] Arrival date & time: 11/19/20  5597     History Chief Complaint  Patient presents with  . Leg Pain    [name] is a 75 y.o. female.  Patient complains of pain in her right knee and calf  The history is provided by the patient and medical records. No language interpreter was used.  Leg Pain Location:  Leg Leg location:  R leg Pain details:    Quality:  Aching   Radiates to:  Does not radiate   Severity:  Moderate   Onset quality:  Sudden Associated symptoms: no back pain and no fatigue        Past Medical History:  Diagnosis Date  . Arthritis   . Coronary artery disease   . Diabetes mellitus without complication (HCC)   . Encephalopathy 03/02/2014  . Hyperlipidemia   . Hypertension   . Osteoporosis   . Referred otalgia of right ear 02/11/2017  . Renal disorder   . Sarcoidosis   . Throat pain 03/03/2020    Patient Active Problem List   Diagnosis Date Noted  . Overactive bladder 10/11/2020  . Anxiety 09/13/2020  . Tongue pain 04/22/2020  . Gastroesophageal reflux disease 03/03/2020  . Laryngopharyngeal reflux (LPR) 03/03/2020  . Depression, major, single episode, moderate (HCC) 12/02/2019  . Diabetes mellitus without complication (HCC) 12/02/2019  . Hyperlipidemia 12/02/2019  . Essential hypertension 12/02/2019  . Lumbar radiculopathy 10/31/2019  . Polyarthropathy 10/31/2019  . Polyneuropathy due to type 2 diabetes mellitus (HCC) 10/31/2019  . Bilateral sensorineural hearing loss 02/11/2017  . Right ear impacted cerumen 02/11/2017  . Sarcoidosis 03/02/2014  . Low back pain 03/02/2014    Past Surgical History:  Procedure Laterality Date  . ANKLE SURGERY    . BACK SURGERY    . FRACTURE SURGERY     tibia      OB History   No obstetric history on file.     Family History  Problem Relation Age of Onset  . Hypertension Mother   . Hyperlipidemia Mother   . Throat cancer Father      Social History   Tobacco Use  . Smoking status: Never Smoker  . Smokeless tobacco: Never Used  Substance Use Topics  . Alcohol use: No  . Drug use: No    Home Medications Prior to Admission medications   Medication Sig Start Date End Date Taking? Authorizing Provider  oxyCODONE-acetaminophen (PERCOCET) 5-325 MG tablet Take 2 tablets by mouth every 6 (six) hours as needed. 11/19/20  Yes Bethann Berkshire, MD  albuterol (VENTOLIN HFA) 108 (90 Base) MCG/ACT inhaler Inhale 1-2 puffs into the lungs every 6 (six) hours as needed for wheezing or shortness of breath. 10/22/20   Avegno, Zachery Dakins, FNP  alendronate (FOSAMAX) 70 MG tablet TAKE 1 TABLET BY MOUTH ONCE WEEKLY. 10/04/20   Freddy Finner, NP  busPIRone (BUSPAR) 5 MG tablet TAKE (1) TABLET BY MOUTH TWICE DAILY. 11/07/20   Freddy Finner, NP  cetirizine (ZYRTEC) 5 MG tablet Take 1 tablet (5 mg total) by mouth daily. 10/22/20   Avegno, Zachery Dakins, FNP  fentaNYL (DURAGESIC) 25 MCG/HR patch Place 1 patch (25 mcg total) onto the skin every 3 (three) days. 03/05/14   Kari Baars, MD  furosemide (LASIX) 40 MG tablet Take 1 tablet (40 mg total) by mouth 2 (two) times daily. 09/14/20   Freddy Finner, NP  glipiZIDE (GLUCOTROL XL) 5 MG 24 hr tablet  Take 5 mg by mouth daily with breakfast.    [provider]  hydroxychloroquine (PLAQUENIL) 200 MG tablet Take 1 tablet (200 mg total) by mouth daily. 09/14/20   Freddy FinnerMills, Hannah M, NP  lisinopril (ZESTRIL) 2.5 MG tablet TAKE ONE TABLET BY MOUTH ONCE DAILY FOR BLOOD PRESSURE. 11/07/20   Freddy FinnerMills, Hannah M, NP  magic mouthwash w/lidocaine SOLN Take 5 mLs by mouth 2 (two) times daily as needed for mouth pain. 04/21/20   Freddy FinnerMills, Hannah M, NP  menthol-cetylpyridinium (CEPACOL) 3 MG lozenge Take 1 lozenge (3 mg total) by mouth as needed for sore throat. 01/07/20   Avegno, Zachery DakinsKomlanvi S, FNP  methocarbamol (ROBAXIN) 500 MG tablet Take 500 mg by mouth every 6 (six) hours as needed for muscle spasms.    [provider]  neomycin-polymyxin-hydrocortisone (CORTISPORIN) 3.5-10000-1 OTIC suspension Place 4 drops into the right ear 3 (three) times daily. 10/22/20   Avegno, Zachery DakinsKomlanvi S, FNP  oxybutynin (DITROPAN) 5 MG tablet Take 1 tablet (5 mg total) by mouth 3 (three) times daily. 11/14/20   Heather RobertsGray, Dicy Smigel M, NP  oxycodone (OXY-IR) 5 MG capsule Take 1 capsule (5 mg total) by mouth 4 (four) times daily as needed for pain. 03/05/14   Kari BaarsHawkins, Edward, MD  pantoprazole (PROTONIX) 40 MG tablet Take 40 mg by mouth 2 (two) times daily.    [provider]  potassium chloride SA (KLOR-CON) 20 MEQ tablet Take 1 tablet (20 mEq total) by mouth 2 (two) times daily. 09/14/20   Freddy FinnerMills, Hannah M, NP  predniSONE (DELTASONE) 20 MG tablet Take 1 tablet (20 mg total) by mouth 2 (two) times daily with a meal for 5 days. 11/17/20 11/22/20  Wurst, Lowanda FosterBrittany, PA-C  pregabalin (LYRICA) 50 MG capsule Take 1 capsule (50 mg total) by mouth at bedtime. 03/05/14   Kari BaarsHawkins, Edward, MD  sertraline (ZOLOFT) 100 MG tablet Take 1 tablet (100 mg total) by mouth daily. Stop escitalopram when starting this. 11/08/20   Heather RobertsGray, Dedrick Heffner M, NP  simvastatin (ZOCOR) 40 MG tablet Take 1 tablet (40 mg total) by mouth at bedtime. 06/16/20   Freddy FinnerMills, Hannah M, NP  sodium bicarbonate 650 MG tablet TAKE (1) TABLET BY MOUTH TWICE DAILY. 09/14/20   Freddy FinnerMills, Hannah M, NP  traMADol (ULTRAM) 50 MG tablet Take 1 tablet (50 mg total) by mouth every 6 (six) hours as needed. 01/18/17   Bethann BerkshireZammit, Braileigh Landenberger, MD  Vitamin D, Ergocalciferol, (DRISDOL) 1.25 MG (50000 UNIT) CAPS capsule TAKE 1 CAPSULE THE SAME DAY EACH WEEK. 10/04/20   Freddy FinnerMills, Hannah M, NP    Allergies    Darvon [propoxyphene], Morphine and related, and Penicillins  Review of Systems   Review of Systems  Constitutional: Negative for appetite change and fatigue.  HENT: Negative for congestion, ear discharge and sinus pressure.   Eyes: Negative for discharge.  Respiratory: Negative for cough.   Cardiovascular:  Negative for chest pain.  Gastrointestinal: Negative for abdominal pain and diarrhea.  Genitourinary: Negative for frequency and hematuria.  Musculoskeletal: Negative for back pain.       Right knee pain  Skin: Negative for rash.  Neurological: Negative for seizures and headaches.  Psychiatric/Behavioral: Negative for hallucinations.    Physical Exam Updated Vital Signs BP 123/74 (BP Location: Right Arm)   Pulse 85   Temp 98.7 F (37.1 C) (Oral)   Resp 19   Ht 5\' 8"  (1.727 m)   Wt 90.7 kg   SpO2 92%   BMI 30.41 kg/m   Physical Exam Vitals  and nursing note reviewed.  Constitutional:      Appearance: She is well-developed.  HENT:     Head: Normocephalic.     Mouth/Throat:     Mouth: Mucous membranes are moist.  Eyes:     General: No scleral icterus.    Conjunctiva/sclera: Conjunctivae normal.  Neck:     Thyroid: No thyromegaly.  Cardiovascular:     Rate and Rhythm: Normal rate and regular rhythm.     Heart sounds: No murmur heard. No friction rub. No gallop.   Pulmonary:     Breath sounds: No stridor. No wheezing or rales.  Chest:     Chest wall: No tenderness.  Abdominal:     General: There is no distension.     Tenderness: There is no abdominal tenderness. There is no rebound.  Musculoskeletal:     Cervical back: Neck supple.     Comments: Tender right knee and calf  Lymphadenopathy:     Cervical: No cervical adenopathy.  Skin:    Findings: No erythema or rash.  Neurological:     Mental Status: She is alert and oriented to person, place, and time.     Motor: No abnormal muscle tone.     Coordination: Coordination normal.  Psychiatric:        Behavior: Behavior normal.     ED Results / Procedures / Treatments   Labs (all labs ordered are listed, but only abnormal results are displayed) Labs Reviewed - No data to display  EKG None  Radiology US Venous Img Lower Unilateral Right  Result Date: 11/19/2020 CLINICAL DATA:  75 year old female with  RIGHT LOWER extremity pain for 4 days. EXAM: RIGHT LOWER EXTREMITY VENOUS DOPPLER ULTRASOUND TECHNIQUE: Gray-scale sonography with compression, as well as color and duplex ultrasound, were performed to evaluate the deep venous system(s) from the level of the common femoral vein through the popliteal and proximal calf veins. COMPARISON:  None. FINDINGS: VENOUS Normal compressibility of the common femoral, superficial femoral, and popliteal veins, as well as the visualized calf veins. Visualized portions of profunda femoral vein and great saphenous vein unremarkable. No filling defects to suggest DVT on grayscale or color Doppler imaging. Doppler waveforms show normal direction of venous flow, normal respiratory plasticity and response to augmentation. Limited views of the contralateral common femoral vein are unremarkable. OTHER None. Limitations: Body habitus IMPRESSION: Negative. Electronically Signed   By: Harmon Pier M.D.   On: 11/19/2020 11:47   DG Knee Complete 4 Views Right  Result Date: 11/19/2020 CLINICAL DATA:  RIGHT leg pain since Tuesday, history of diabetes mellitus and hypertension EXAM: RIGHT KNEE - COMPLETE 4+ VIEW COMPARISON:  None FINDINGS: Osseous demineralization. Tricompartmental osteoarthritic changes with joint space narrowing and spur formation. No acute fracture, dislocation, or bone destruction. No joint effusion. IMPRESSION: Osseous demineralization with tricompartmental osteoarthritic changes. No acute abnormalities. Electronically Signed   By: Ulyses Southward M.D.   On: 11/19/2020 11:45    Procedures Procedures   Medications Ordered in ED Medications - No data to display  ED Course  I have reviewed the triage vital signs and the nursing notes.  Pertinent labs & imaging results that were available during my care of the patient were reviewed by me and considered in my medical decision making (see chart for details).    MDM Rules/Calculators/A&P                           Ultrasound does not show  DVT.  Right knee on x-ray shows osteoarthritis.  Patient given pain medicine will follow up with PCP Final Clinical Impression(s) / ED Diagnoses Final diagnoses:  Right leg pain    Rx / DC Orders ED Discharge Orders         Ordered    oxyCODONE-acetaminophen (PERCOCET) 5-325 MG tablet  Every 6 hours PRN        11/19/20 1200           Bethann Berkshire, MD 11/19/20 1202

## 2020-11-19 NOTE — Discharge Instructions (Addendum)
Take your pain medicine as needed.  And follow-up with your family doctor next week for recheck

## 2020-11-19 NOTE — ED Triage Notes (Signed)
Pt c/o right leg pain that started Tuesday while at the dentist. Pt states she was seen at urgent care on Thursday and given a prednisone shot and pills with no relief.

## 2020-11-23 DIAGNOSIS — M1711 Unilateral primary osteoarthritis, right knee: Secondary | ICD-10-CM | POA: Diagnosis not present

## 2020-11-23 DIAGNOSIS — M130 Polyarthritis, unspecified: Secondary | ICD-10-CM | POA: Diagnosis not present

## 2020-11-23 DIAGNOSIS — R002 Palpitations: Secondary | ICD-10-CM | POA: Diagnosis not present

## 2020-11-23 DIAGNOSIS — G4700 Insomnia, unspecified: Secondary | ICD-10-CM | POA: Diagnosis not present

## 2020-11-23 DIAGNOSIS — G894 Chronic pain syndrome: Secondary | ICD-10-CM | POA: Diagnosis not present

## 2020-11-23 DIAGNOSIS — E1142 Type 2 diabetes mellitus with diabetic polyneuropathy: Secondary | ICD-10-CM | POA: Diagnosis not present

## 2020-11-23 DIAGNOSIS — M5459 Other low back pain: Secondary | ICD-10-CM | POA: Diagnosis not present

## 2020-11-23 DIAGNOSIS — M5416 Radiculopathy, lumbar region: Secondary | ICD-10-CM | POA: Diagnosis not present

## 2020-11-23 DIAGNOSIS — Z79891 Long term (current) use of opiate analgesic: Secondary | ICD-10-CM | POA: Diagnosis not present

## 2020-12-05 ENCOUNTER — Other Ambulatory Visit: Payer: Self-pay | Admitting: Family Medicine

## 2020-12-05 DIAGNOSIS — F419 Anxiety disorder, unspecified: Secondary | ICD-10-CM

## 2020-12-08 ENCOUNTER — Other Ambulatory Visit: Payer: Self-pay

## 2020-12-08 ENCOUNTER — Ambulatory Visit (INDEPENDENT_AMBULATORY_CARE_PROVIDER_SITE_OTHER): Payer: HMO | Admitting: Nurse Practitioner

## 2020-12-08 ENCOUNTER — Encounter: Payer: Self-pay | Admitting: Nurse Practitioner

## 2020-12-08 DIAGNOSIS — F419 Anxiety disorder, unspecified: Secondary | ICD-10-CM | POA: Diagnosis not present

## 2020-12-08 MED ORDER — SERTRALINE HCL 100 MG PO TABS: 150.0000 mg | ORAL_TABLET | Freq: Every day | ORAL | 1 refills | Status: DC

## 2020-12-08 NOTE — Patient Instructions (Signed)
I sent in sertraline to the pharmacy because I increased the dose today. Please start taking 1.5 pills per day.

## 2020-12-08 NOTE — Assessment & Plan Note (Signed)
-  GAD-7 is down to 8 today; last was 15, previous 21 -INCREASE sertraline

## 2020-12-08 NOTE — Progress Notes (Signed)
Acute Office Visit  Subjective:    Patient ID: [name], female    DOB: 1946-02-02, 75 y.o.   MRN: [id number]  Chief Complaint  Patient presents with  . Anxiety    HPI Patient is in today for med check. At last OV, we increased sertraline to 100 mg.  At that time, her GAD-7 had improved from 21 to 15.  She states that she feels much better with the increase in her sertraline.  Past Medical History:  Diagnosis Date  . Arthritis   . Coronary artery disease   . Diabetes mellitus without complication (HCC)   . Encephalopathy 03/02/2014  . Hyperlipidemia   . Hypertension   . Osteoporosis   . Referred otalgia of right ear 02/11/2017  . Renal disorder   . Sarcoidosis   . Throat pain 03/03/2020    Past Surgical History:  Procedure Laterality Date  . ANKLE SURGERY    . BACK SURGERY    . FRACTURE SURGERY     tibia     Family History  Problem Relation Age of Onset  . Hypertension Mother   . Hyperlipidemia Mother   . Throat cancer Father     Social History   Socioeconomic History  . Marital status: Married    Spouse name: Christen Bame   . Number of children: 3  . Years of education: Not on file  . Highest education level: Not on file  Occupational History  . Not on file  Tobacco Use  . Smoking status: Never Smoker  . Smokeless tobacco: Never Used  Substance and Sexual Activity  . Alcohol use: No  . Drug use: No  . Sexual activity: Not Currently  Other Topics Concern  . 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 Difficulty of  Paying Living Expenses: Not hard at all  Food Insecurity: No Food Insecurity  . Worried About Programme researcher, broadcasting/film/video in the Last Year: Never true  . Ran Out of Food in the Last Year: Never true  Transportation Needs: No Transportation Needs  . Lack of Transportation (Medical): No  . Lack of Transportation (Non-Medical): No  Physical Activity: Inactive  . Days of Exercise per Week: 0 days  . Minutes of Exercise per Session: 0 min  Stress: No Stress Concern Present  . Feeling of Stress : Only a little  Social Connections: Socially Isolated  . Frequency of Communication with Friends and Family: Once a week  . Frequency of Social Gatherings with Friends and Family: Once a week  . Attends Religious Services: Never  . Active Member of Clubs or Organizations: No  . Attends Banker Meetings: Never  . Marital Status: Married  Catering manager Violence: Not on file    Outpatient Medications Prior to Visit  Medication Sig Dispense Refill  . albuterol (VENTOLIN HFA) 108 (90 Base) MCG/ACT inhaler Inhale 1-2 puffs  into the lungs every 6 (six) hours as needed for wheezing or shortness of breath. 18 g 0  . alendronate (FOSAMAX) 70 MG tablet TAKE 1 TABLET BY MOUTH ONCE WEEKLY. 4 tablet 0  . busPIRone (BUSPAR) 5 MG tablet TAKE (1) TABLET BY MOUTH TWICE DAILY. 60 tablet 0  . cetirizine (ZYRTEC) 5 MG tablet Take 1 tablet (5 mg total) by mouth daily. 30 tablet 0  . fentaNYL (DURAGESIC) 25 MCG/HR patch Place 1 patch (25 mcg total) onto the skin every 3 (three) days. 5 patch 0  . furosemide (LASIX) 40 MG tablet Take 1 tablet (40 mg total) by mouth 2 (two) times daily. 30 tablet 3  . glipiZIDE (GLUCOTROL XL) 5 MG 24 hr tablet Take 5 mg by mouth daily with breakfast.    . hydroxychloroquine (PLAQUENIL) 200 MG tablet Take 1 tablet (200 mg total) by mouth daily. 30 tablet 3  . lisinopril (ZESTRIL) 2.5 MG tablet TAKE ONE TABLET BY MOUTH ONCE DAILY FOR BLOOD PRESSURE. 30 tablet 0  . magic mouthwash  w/lidocaine SOLN Take 5 mLs by mouth 2 (two) times daily as needed for mouth pain. 50 mL 0  . menthol-cetylpyridinium (CEPACOL) 3 MG lozenge Take 1 lozenge (3 mg total) by mouth as needed for sore throat. 100 tablet 1  . methocarbamol (ROBAXIN) 500 MG tablet Take 500 mg by mouth every 6 (six) hours as needed for muscle spasms.    Marland Kitchen neomycin-polymyxin-hydrocortisone (CORTISPORIN) 3.5-10000-1 OTIC suspension Place 4 drops into the right ear 3 (three) times daily. 10 mL 0  . oxybutynin (DITROPAN) 5 MG tablet Take 1 tablet (5 mg total) by mouth 3 (three) times daily. 270 tablet 1  . oxycodone (OXY-IR) 5 MG capsule Take 1 capsule (5 mg total) by mouth 4 (four) times daily as needed for pain. 30 capsule 0  . oxyCODONE-acetaminophen (PERCOCET) 5-325 MG tablet Take 2 tablets by mouth every 6 (six) hours as needed. 20 tablet 0  . pantoprazole (PROTONIX) 40 MG tablet Take 40 mg by mouth 2 (two) times daily.    . potassium chloride SA (KLOR-CON) 20 MEQ tablet Take 1 tablet (20 mEq total) by mouth 2 (two) times daily. 60 tablet 3  . pregabalin (LYRICA) 50 MG capsule Take 1 capsule (50 mg total) by mouth at bedtime. 30 capsule 1  . simvastatin (ZOCOR) 40 MG tablet Take 1 tablet (40 mg total) by mouth at bedtime. 30 tablet 5  . sodium bicarbonate 650 MG tablet TAKE (1) TABLET BY MOUTH TWICE DAILY. 60 tablet 3  . traMADol (ULTRAM) 50 MG tablet Take 1 tablet (50 mg total) by mouth every 6 (six) hours as needed. 15 tablet 0  . Vitamin D, Ergocalciferol, (DRISDOL) 1.25 MG (50000 UNIT) CAPS capsule TAKE 1 CAPSULE THE SAME DAY EACH WEEK. 5 capsule 0  . sertraline (ZOLOFT) 100 MG tablet Take 1 tablet (100 mg total) by mouth daily. Stop escitalopram when starting this. 30 tablet 1   No facility-administered medications prior to visit.    Allergies  Allergen Reactions  . Darvon [Propoxyphene] Nausea And Vomiting  . Morphine And Related Itching and Rash  . Penicillins Itching and Rash    Pt denies this allergy     Review of Systems  Constitutional: Negative.   Respiratory: Negative.   Cardiovascular: Negative.   Psychiatric/Behavioral: Negative for self-injury and suicidal ideas. The patient is nervous/anxious.        Objective:    Physical Exam Constitutional:      Appearance: Normal  appearance.  Cardiovascular:     Rate and Rhythm: Normal rate and regular rhythm.     Pulses: Normal pulses.     Heart sounds: Normal heart sounds.  Pulmonary:     Effort: Pulmonary effort is normal.     Breath sounds: Normal breath sounds.  Neurological:     Mental Status: She is alert.  Psychiatric:        Mood and Affect: Mood normal.        Behavior: Behavior normal.        Thought Content: Thought content normal.        Judgment: Judgment normal.     Comments: GAD-7 = 8 today     BP 120/84   Pulse 88   Temp 97.9 F (36.6 C)   Resp 20   Ht 5\' 8"  (1.727 m)   Wt 204 lb (92.5 kg)   SpO2 94%   BMI 31.02 kg/m  Wt Readings from Last 3 Encounters:  12/08/20 204 lb (92.5 kg)  11/19/20 200 lb (90.7 kg)  11/08/20 206 lb (93.4 kg)    Health Maintenance Due  Topic Date Due  . Hepatitis C Screening  Never done  . FOOT EXAM  Never done  . OPHTHALMOLOGY EXAM  Never done  . TETANUS/TDAP  Never done  . COLONOSCOPY (Pts 45-43yrs Insurance coverage will need to be confirmed)  Never done  . COVID-19 Vaccine (3 - Booster for Moderna series) 05/26/2020  . MAMMOGRAM  07/17/2020  . HEMOGLOBIN A1C  08/27/2020    There are no preventive care reminders to display for this patient.   Lab Results  Component Value Date   TSH 3.19 07/03/2018   Lab Results  Component Value Date   WBC 4.8 02/25/2020   HGB 13.1 02/25/2020   HCT 41.4 02/25/2020   MCV 87.7 02/25/2020   PLT 203 02/25/2020   Lab Results  Component Value Date   NA 142 02/25/2020   K 3.4 (L) 02/25/2020   CO2 30 02/25/2020   GLUCOSE 112 (H) 02/25/2020   BUN 16 02/25/2020   CREATININE 1.41 (H) 02/25/2020   BILITOT 0.5 02/25/2020    ALKPHOS 52 07/03/2018   AST 15 02/25/2020   ALT 9 02/25/2020   PROT 6.9 02/25/2020   ALBUMIN 4.2 07/03/2018   CALCIUM 9.6 02/25/2020   ANIONGAP 7 01/18/2017   Lab Results  Component Value Date   CHOL 211 (H) 02/25/2020   Lab Results  Component Value Date   HDL 62 02/25/2020   Lab Results  Component Value Date   LDLCALC 121 (H) 02/25/2020   Lab Results  Component Value Date   TRIG 167 (H) 02/25/2020   Lab Results  Component Value Date   CHOLHDL 3.4 02/25/2020   Lab Results  Component Value Date   HGBA1C 5.6 02/25/2020       Assessment & Plan:   Problem List Items Addressed This Visit      Other   Anxiety    -GAD-7 is down to 8 today; last was 15, previous 21 -INCREASE sertraline      Relevant Medications   sertraline (ZOLOFT) 100 MG tablet       Meds ordered this encounter  Medications  . sertraline (ZOLOFT) 100 MG tablet    Sig: Take 1.5 tablets (150 mg total) by mouth daily.    Dispense:  135 tablet    Refill:  1     04/27/2020, NP

## 2020-12-15 ENCOUNTER — Other Ambulatory Visit: Payer: Self-pay | Admitting: Family Medicine

## 2020-12-20 ENCOUNTER — Other Ambulatory Visit: Payer: Self-pay | Admitting: Family Medicine

## 2021-01-04 ENCOUNTER — Other Ambulatory Visit: Payer: Self-pay | Admitting: Nurse Practitioner

## 2021-01-04 ENCOUNTER — Other Ambulatory Visit: Payer: Self-pay | Admitting: Family Medicine

## 2021-01-04 DIAGNOSIS — F419 Anxiety disorder, unspecified: Secondary | ICD-10-CM

## 2021-01-12 ENCOUNTER — Ambulatory Visit: Payer: HMO | Admitting: Nurse Practitioner

## 2021-01-20 ENCOUNTER — Other Ambulatory Visit: Payer: Self-pay | Admitting: Family Medicine

## 2021-01-20 ENCOUNTER — Other Ambulatory Visit: Payer: Self-pay | Admitting: Nurse Practitioner

## 2021-01-20 MED ORDER — SIMVASTATIN 40 MG PO TABS: 40.0000 mg | ORAL_TABLET | Freq: Every day | ORAL | 1 refills | Status: DC

## 2021-01-25 ENCOUNTER — Encounter: Payer: Self-pay | Admitting: Nurse Practitioner

## 2021-01-25 ENCOUNTER — Other Ambulatory Visit: Payer: Self-pay

## 2021-01-25 ENCOUNTER — Ambulatory Visit (INDEPENDENT_AMBULATORY_CARE_PROVIDER_SITE_OTHER): Payer: HMO | Admitting: Nurse Practitioner

## 2021-01-25 VITALS — BP 140/86 | HR 88 | Temp 98.4°F | Resp 20 | Ht 68.0 in | Wt 201.0 lb

## 2021-01-25 DIAGNOSIS — Z139 Encounter for screening, unspecified: Secondary | ICD-10-CM | POA: Diagnosis not present

## 2021-01-25 DIAGNOSIS — E559 Vitamin D deficiency, unspecified: Secondary | ICD-10-CM

## 2021-01-25 DIAGNOSIS — E1165 Type 2 diabetes mellitus with hyperglycemia: Secondary | ICD-10-CM

## 2021-01-25 DIAGNOSIS — F419 Anxiety disorder, unspecified: Secondary | ICD-10-CM

## 2021-01-25 DIAGNOSIS — I10 Essential (primary) hypertension: Secondary | ICD-10-CM

## 2021-01-25 NOTE — Assessment & Plan Note (Signed)
-  GAD-7 = 0 -continue sertraline 150 mg daily

## 2021-01-25 NOTE — Patient Instructions (Signed)
Please have fasting labs drawn 2-3 days prior to your appointment so we can discuss the results during your office visit.  

## 2021-01-25 NOTE — Assessment & Plan Note (Signed)
-  BP borderline today -no changes to meds

## 2021-01-25 NOTE — Progress Notes (Signed)
Acute Office Visit  Subjective:    Patient ID: [name], female    DOB: 1945-10-12, 75 y.o.   MRN: [id number]  Chief Complaint  Patient presents with  . Anxiety    HPI Patient is in today for med check. At her last OV, we increased her sertraline to 150 mg, and her GAD-7 was 8 at that time. Today her GAD-7 is down to 0. She states that she is doing very well with anxiety. She denies adverse medication effects.  Past Medical History:  Diagnosis Date  . Arthritis   . Coronary artery disease   . Diabetes mellitus without complication ([place])   . Encephalopathy 03/02/2014  . Hyperlipidemia   . Hypertension   . Osteoporosis   . Referred otalgia of right ear 02/11/2017  . Renal disorder   . Sarcoidosis   . Throat pain 03/03/2020    Past Surgical History:  Procedure Laterality Date  . ANKLE SURGERY    . BACK SURGERY    . FRACTURE SURGERY     tibia     Family History  Problem Relation Age of Onset  . Hypertension Mother   . Hyperlipidemia Mother   . Throat cancer Father     Social History   Socioeconomic History  . Marital status: Married    Spouse name: Edd Arbour   . Number of children: 3  . Years of education: Not on file  . Highest education level: Not on file  Occupational History  . Not on file  Tobacco Use  . Smoking status: Never Smoker  . Smokeless tobacco: Never Used  Substance and Sexual Activity  . Alcohol use: No  . Drug use: No  . Sexual activity: Not Currently  Other Topics Concern  . 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 Difficulty of Paying Living Expenses: Not hard at all  Food Insecurity: No Food Insecurity  . Worried About Charity fundraiser in the Last Year: Never true  . Ran Out of Food in the Last Year: Never true  Transportation Needs: No Transportation Needs  . Lack of Transportation (Medical): No  . Lack of Transportation (Non-Medical): No  Physical Activity: Inactive  . Days of Exercise per Week: 0 days  . Minutes of Exercise per Session: 0 min  Stress: No Stress Concern Present  . Feeling of Stress : Only a little  Social Connections: Socially Isolated  . Frequency of Communication with Friends and Family: Once a week  . Frequency of Social Gatherings with Friends and Family: Once a week  . Attends Religious Services: Never  . Active Member of Clubs or Organizations: No  . Attends Archivist Meetings: Never  . Marital Status: Married  Human resources officer Violence: Not on file    Outpatient Medications Prior to Visit  Medication Sig Dispense Refill  . albuterol (VENTOLIN HFA) 108 (90  Base) MCG/ACT inhaler Inhale 1-2 puffs into the lungs every 6 (six) hours as needed for wheezing or shortness of breath. 18 g 0  . alendronate (FOSAMAX) 70 MG tablet TAKE 1 TABLET BY MOUTH ONCE WEEKLY. 4 tablet 0  . busPIRone (BUSPAR) 5 MG tablet TAKE (1) TABLET BY MOUTH TWICE DAILY. 60 tablet 0  . cetirizine (ZYRTEC) 5 MG tablet Take 1 tablet (5 mg total) by mouth daily. 30 tablet 0  . fentaNYL (DURAGESIC) 25 MCG/HR patch Place 1 patch (25 mcg total) onto the skin every 3 (three) days. 5 patch 0  . furosemide (LASIX) 40 MG tablet TAKE 1 TABLET BY MOUTH TWICE A DAY. 60 tablet 0  . glipiZIDE (GLUCOTROL XL) 5 MG 24 hr tablet Take 5 mg by mouth daily with breakfast.    . hydroxychloroquine (PLAQUENIL) 200 MG tablet Take 1 tablet (200 mg total) by mouth daily. 30 tablet 3  . lisinopril (ZESTRIL) 2.5 MG tablet TAKE ONE TABLET BY MOUTH ONCE DAILY FOR BLOOD PRESSURE. 30 tablet 0  . magic  mouthwash w/lidocaine SOLN Take 5 mLs by mouth 2 (two) times daily as needed for mouth pain. 50 mL 0  . menthol-cetylpyridinium (CEPACOL) 3 MG lozenge Take 1 lozenge (3 mg total) by mouth as needed for sore throat. 100 tablet 1  . methocarbamol (ROBAXIN) 500 MG tablet Take 500 mg by mouth every 6 (six) hours as needed for muscle spasms.    Marland Kitchen neomycin-polymyxin-hydrocortisone (CORTISPORIN) 3.5-10000-1 OTIC suspension Place 4 drops into the right ear 3 (three) times daily. 10 mL 0  . oxybutynin (DITROPAN) 5 MG tablet Take 1 tablet (5 mg total) by mouth 3 (three) times daily. 270 tablet 1  . oxycodone (OXY-IR) 5 MG capsule Take 1 capsule (5 mg total) by mouth 4 (four) times daily as needed for pain. 30 capsule 0  . oxyCODONE-acetaminophen (PERCOCET) 5-325 MG tablet Take 2 tablets by mouth every 6 (six) hours as needed. 20 tablet 0  . pantoprazole (PROTONIX) 40 MG tablet Take 40 mg by mouth 2 (two) times daily.    . potassium chloride SA (KLOR-CON) 20 MEQ tablet Take 1 tablet (20 mEq total) by mouth 2 (two) times daily. 60 tablet 3  . pregabalin (LYRICA) 50 MG capsule Take 1 capsule (50 mg total) by mouth at bedtime. 30 capsule 1  . sertraline (ZOLOFT) 100 MG tablet Take 1.5 tablets (150 mg total) by mouth daily. 135 tablet 1  . simvastatin (ZOCOR) 40 MG tablet Take 1 tablet (40 mg total) by mouth daily at 6 PM. 30 tablet 1  . sodium bicarbonate 650 MG tablet TAKE (1) TABLET BY MOUTH TWICE DAILY. 60 tablet 3  . traMADol (ULTRAM) 50 MG tablet Take 1 tablet (50 mg total) by mouth every 6 (six) hours as needed. 15 tablet 0  . Vitamin D, Ergocalciferol, (DRISDOL) 1.25 MG (50000 UNIT) CAPS capsule TAKE 1 CAPSULE THE SAME DAY EACH WEEK. 5 capsule 0   No facility-administered medications prior to visit.    Allergies  Allergen Reactions  . Darvon [Propoxyphene] Nausea And Vomiting  . Morphine And Related Itching and Rash  . Penicillins Itching and Rash    Pt denies this allergy    Review of Systems   Constitutional: Negative.   Respiratory: Negative.   Cardiovascular: Negative.   Psychiatric/Behavioral: Negative.        Objective:    Physical Exam Constitutional:      Appearance: Normal appearance.  Cardiovascular:     Rate and Rhythm:  Normal rate and regular rhythm.     Pulses: Normal pulses.     Heart sounds: Normal heart sounds.  Pulmonary:     Effort: Pulmonary effort is normal.     Breath sounds: Normal breath sounds.  Neurological:     Mental Status: She is alert.  Psychiatric:        Mood and Affect: Mood normal.        Behavior: Behavior normal.        Thought Content: Thought content normal.        Judgment: Judgment normal.     BP 140/86   Pulse 88   Temp 98.4 F (36.9 C)   Resp 20   Ht '5\' 8"'  (1.727 m)   Wt 201 lb (91.2 kg)   SpO2 94%   BMI 30.56 kg/m  Wt Readings from Last 3 Encounters:  01/25/21 201 lb (91.2 kg)  12/08/20 204 lb (92.5 kg)  11/19/20 200 lb (90.7 kg)    Health Maintenance Due  Topic Date Due  . FOOT EXAM  Never done  . OPHTHALMOLOGY EXAM  Never done  . Hepatitis C Screening  Never done  . TETANUS/TDAP  Never done  . COLONOSCOPY (Pts 45-64yr Insurance coverage will need to be confirmed)  Never done  . Zoster Vaccines- Shingrix (2 of 2) 08/28/2018  . COVID-19 Vaccine (3 - Booster for Moderna series) 04/25/2020  . MAMMOGRAM  07/17/2020  . HEMOGLOBIN A1C  08/27/2020    There are no preventive care reminders to display for this patient.   Lab Results  Component Value Date   TSH 3.19 07/03/2018   Lab Results  Component Value Date   WBC 4.8 02/25/2020   HGB 13.1 02/25/2020   HCT 41.4 02/25/2020   MCV 87.7 02/25/2020   PLT 203 02/25/2020   Lab Results  Component Value Date   NA 142 02/25/2020   K 3.4 (L) 02/25/2020   CO2 30 02/25/2020   GLUCOSE 112 (H) 02/25/2020   BUN 16 02/25/2020   CREATININE 1.41 (H) 02/25/2020   BILITOT 0.5 02/25/2020   ALKPHOS 52 07/03/2018   AST 15 02/25/2020   ALT 9 02/25/2020    PROT 6.9 02/25/2020   ALBUMIN 4.2 07/03/2018   CALCIUM 9.6 02/25/2020   ANIONGAP 7 01/18/2017   Lab Results  Component Value Date   CHOL 211 (H) 02/25/2020   Lab Results  Component Value Date   HDL 62 02/25/2020   Lab Results  Component Value Date   LDLCALC 121 (H) 02/25/2020   Lab Results  Component Value Date   TRIG 167 (H) 02/25/2020   Lab Results  Component Value Date   CHOLHDL 3.4 02/25/2020   Lab Results  Component Value Date   HGBA1C 5.6 02/25/2020       Assessment & Plan:   Problem List Items Addressed This Visit      Cardiovascular and Mediastinum   Essential hypertension    -BP borderline today -no changes to meds      Relevant Orders   CBC with Differential/Platelet   CMP14+EGFR   Lipid Panel With LDL/HDL Ratio     Other   Anxiety    -GAD-7 = 0 -continue sertraline 150 mg daily       Other Visit Diagnoses    Vitamin D deficiency    -  Primary   Relevant Orders   VITAMIN D 25 Hydroxy (Vit-D Deficiency, Fractures)   Screening due       Relevant Orders  Hepatitis C antibody   Uncontrolled type 2 diabetes mellitus with hyperglycemia (HCC)       Relevant Orders   Hemoglobin A1c   Microalbumin / creatinine urine ratio       No orders of the defined types were placed in this encounter.    Noreene Larsson, NP

## 2021-01-26 ENCOUNTER — Ambulatory Visit: Payer: HMO | Admitting: Nurse Practitioner

## 2021-02-02 ENCOUNTER — Other Ambulatory Visit: Payer: Self-pay | Admitting: Nurse Practitioner

## 2021-02-03 ENCOUNTER — Other Ambulatory Visit: Payer: Self-pay | Admitting: Nurse Practitioner

## 2021-02-03 DIAGNOSIS — F419 Anxiety disorder, unspecified: Secondary | ICD-10-CM

## 2021-02-13 ENCOUNTER — Other Ambulatory Visit: Payer: Self-pay | Admitting: Family Medicine

## 2021-02-13 ENCOUNTER — Other Ambulatory Visit: Payer: Self-pay | Admitting: Nurse Practitioner

## 2021-02-13 DIAGNOSIS — G4700 Insomnia, unspecified: Secondary | ICD-10-CM | POA: Diagnosis not present

## 2021-02-13 DIAGNOSIS — R002 Palpitations: Secondary | ICD-10-CM | POA: Diagnosis not present

## 2021-02-13 DIAGNOSIS — G894 Chronic pain syndrome: Secondary | ICD-10-CM | POA: Diagnosis not present

## 2021-02-13 DIAGNOSIS — M5416 Radiculopathy, lumbar region: Secondary | ICD-10-CM | POA: Diagnosis not present

## 2021-02-13 DIAGNOSIS — M5459 Other low back pain: Secondary | ICD-10-CM | POA: Diagnosis not present

## 2021-02-13 DIAGNOSIS — E1142 Type 2 diabetes mellitus with diabetic polyneuropathy: Secondary | ICD-10-CM | POA: Diagnosis not present

## 2021-02-13 DIAGNOSIS — Z79891 Long term (current) use of opiate analgesic: Secondary | ICD-10-CM | POA: Diagnosis not present

## 2021-02-13 DIAGNOSIS — M130 Polyarthritis, unspecified: Secondary | ICD-10-CM | POA: Diagnosis not present

## 2021-02-20 ENCOUNTER — Other Ambulatory Visit: Payer: Self-pay | Admitting: Nurse Practitioner

## 2021-02-20 DIAGNOSIS — F419 Anxiety disorder, unspecified: Secondary | ICD-10-CM

## 2021-02-21 ENCOUNTER — Other Ambulatory Visit: Payer: Self-pay

## 2021-02-21 ENCOUNTER — Ambulatory Visit: Payer: HMO

## 2021-02-21 LAB — HM DIABETES EYE EXAM

## 2021-02-23 ENCOUNTER — Encounter: Payer: Self-pay | Admitting: *Deleted

## 2021-03-03 ENCOUNTER — Other Ambulatory Visit: Payer: Self-pay | Admitting: Nurse Practitioner

## 2021-03-18 ENCOUNTER — Other Ambulatory Visit: Payer: Self-pay | Admitting: Nurse Practitioner

## 2021-03-20 ENCOUNTER — Other Ambulatory Visit: Payer: Self-pay

## 2021-03-20 MED ORDER — VITAMIN D (ERGOCALCIFEROL) 1.25 MG (50000 UNIT) PO CAPS: ORAL_CAPSULE | ORAL | 0 refills | Status: DC

## 2021-03-22 ENCOUNTER — Other Ambulatory Visit: Payer: Self-pay | Admitting: Nurse Practitioner

## 2021-03-29 ENCOUNTER — Ambulatory Visit: Payer: HMO | Admitting: Nurse Practitioner

## 2021-03-31 ENCOUNTER — Other Ambulatory Visit: Payer: Self-pay | Admitting: Nurse Practitioner

## 2021-03-31 DIAGNOSIS — F419 Anxiety disorder, unspecified: Secondary | ICD-10-CM

## 2021-04-04 ENCOUNTER — Encounter: Payer: Self-pay | Admitting: Internal Medicine

## 2021-04-04 ENCOUNTER — Telehealth (INDEPENDENT_AMBULATORY_CARE_PROVIDER_SITE_OTHER): Payer: HMO | Admitting: Internal Medicine

## 2021-04-04 ENCOUNTER — Other Ambulatory Visit: Payer: Self-pay

## 2021-04-04 DIAGNOSIS — F419 Anxiety disorder, unspecified: Secondary | ICD-10-CM | POA: Diagnosis not present

## 2021-04-04 DIAGNOSIS — F4321 Adjustment disorder with depressed mood: Secondary | ICD-10-CM

## 2021-04-04 MED ORDER — BUSPIRONE HCL 7.5 MG PO TABS: 7.5000 mg | ORAL_TABLET | Freq: Two times a day (BID) | ORAL | 1 refills | Status: DC

## 2021-04-04 NOTE — Progress Notes (Signed)
Virtual Visit via Telephone Note   This visit type was conducted due to national recommendations for restrictions regarding the COVID-19 Pandemic (e.g. social distancing) in an effort to limit this patient's exposure and mitigate transmission in our community.  Due to her co-morbid illnesses, this patient is at least at moderate risk for complications without adequate follow up.  This format is felt to be most appropriate for this patient at this time.  The patient did not have access to video technology/had technical difficulties with video requiring transitioning to audio format only (telephone).  All issues noted in this document were discussed and addressed.  No physical exam could be performed with this format.  Evaluation Performed:  Follow-up visit  Date:  04/04/2021   ID:  [name], DOB 06/04/1946, MRN [id number]  Patient Location: Home Provider Location: Office/Clinic  Participants: Patient Location of Patient: Home Location of Provider: Telehealth Consent was obtain for visit to be over via telehealth. I verified that I am speaking with the correct person using two identifiers.  PCP:  Heather Roberts, NP   Chief Complaint:  Anxiety/grief  History of Present Illness:    [name] is a 75 y.o. female who has a televisit for c/o anxiety since yesterday since she came to know about her son's death. She has been having difficulty sleeping. Denies any SI or HI. She takes Zoloft and Buspar currently.  The patient does not have symptoms concerning for COVID-19 infection (fever, chills, cough, or new shortness of breath).   Past Medical, Surgical, Social History, Allergies, and Medications have been Reviewed.  Past Medical History:  Diagnosis Date   Arthritis    Coronary artery disease    Diabetes mellitus without complication (HCC)    Encephalopathy 03/02/2014   Hyperlipidemia    Hypertension    Osteoporosis    Referred otalgia of right ear 02/11/2017   Renal  disorder    Sarcoidosis    Throat pain 03/03/2020   Past Surgical History:  Procedure Laterality Date   ANKLE SURGERY     BACK SURGERY     FRACTURE SURGERY     tibia      Current Meds  Medication Sig   albuterol (VENTOLIN HFA) 108 (90 Base) MCG/ACT inhaler Inhale 1-2 puffs into the lungs every 6 (six) hours as needed for wheezing or shortness of breath.   alendronate (FOSAMAX) 70 MG tablet TAKE 1 TABLET BY MOUTH ONCE WEEKLY.   busPIRone (BUSPAR) 5 MG tablet TAKE (1) TABLET BY MOUTH TWICE DAILY.   cetirizine (ZYRTEC) 5 MG tablet Take 1 tablet (5 mg total) by mouth daily.   fentaNYL (DURAGESIC) 25 MCG/HR patch Place 1 patch (25 mcg total) onto the skin every 3 (three) days.   furosemide (LASIX) 40 MG tablet TAKE 1 TABLET BY MOUTH TWICE A DAY.   glipiZIDE (GLUCOTROL XL) 5 MG 24 hr tablet Take 5 mg by mouth daily with breakfast.   hydroxychloroquine (PLAQUENIL) 200 MG tablet Take 1 tablet (200 mg total) by mouth daily.   lisinopril (ZESTRIL) 2.5 MG tablet TAKE ONE TABLET BY MOUTH ONCE DAILY FOR BLOOD PRESSURE.   magic mouthwash w/lidocaine SOLN Take 5 mLs by mouth 2 (two) times daily as needed for mouth pain.   menthol-cetylpyridinium (CEPACOL) 3 MG lozenge Take 1 lozenge (3 mg total) by mouth as needed for sore throat.   methocarbamol (ROBAXIN) 500 MG tablet Take 500 mg by mouth every 6 (six) hours as needed for muscle spasms.  neomycin-polymyxin-hydrocortisone (CORTISPORIN) 3.5-10000-1 OTIC suspension Place 4 drops into the right ear 3 (three) times daily.   oxybutynin (DITROPAN) 5 MG tablet Take 1 tablet (5 mg total) by mouth 3 (three) times daily.   oxycodone (OXY-IR) 5 MG capsule Take 1 capsule (5 mg total) by mouth 4 (four) times daily as needed for pain.   oxyCODONE-acetaminophen (PERCOCET) 5-325 MG tablet Take 2 tablets by mouth every 6 (six) hours as needed.   pantoprazole (PROTONIX) 40 MG tablet Take 40 mg by mouth 2 (two) times daily.   potassium chloride SA (KLOR-CON) 20 MEQ  tablet Take 1 tablet (20 mEq total) by mouth 2 (two) times daily.   pregabalin (LYRICA) 50 MG capsule Take 1 capsule (50 mg total) by mouth at bedtime.   sertraline (ZOLOFT) 100 MG tablet TAKE 1 1/2 TABLETS (150MG ) BY MOUTH DAILY   simvastatin (ZOCOR) 40 MG tablet TAKE (1) TABLET BY MOUTH AT BEDTIME.   sodium bicarbonate 650 MG tablet TAKE (1) TABLET BY MOUTH TWICE DAILY.   traMADol (ULTRAM) 50 MG tablet Take 1 tablet (50 mg total) by mouth every 6 (six) hours as needed.   Vitamin D, Ergocalciferol, (DRISDOL) 1.25 MG (50000 UNIT) CAPS capsule TAKE 1 CAPSULE THE SAME DAY EACH WEEK.     Allergies:   Darvon [propoxyphene], Morphine and related, and Penicillins   ROS:   Please see the history of present illness.     All other systems reviewed and are negative.   Labs/Other Tests and Data Reviewed:    Recent Labs: No results found for requested labs within last 8760 hours.   Recent Lipid Panel Lab Results  Component Value Date/Time   CHOL 211 (H) 02/25/2020 03:30 PM   TRIG 167 (H) 02/25/2020 03:30 PM   HDL 62 02/25/2020 03:30 PM   CHOLHDL 3.4 02/25/2020 03:30 PM   LDLCALC 121 (H) 02/25/2020 03:30 PM    Wt Readings from Last 3 Encounters:  01/25/21 201 lb (91.2 kg)  12/08/20 204 lb (92.5 kg)  11/19/20 200 lb (90.7 kg)      ASSESSMENT & PLAN:    Anxiety/grief reaction On Zoloft 150 mg QD and Buspar 5 mg BID Increased Buspar to 7.5 mg BID Anxious since knowing about her son's death No SI or HI Referred to [facility] therapy for grief counseling   Time:   Today, I have spent 9 minutes reviewing the chart, including problem list, medications, and with the patient with telehealth technology discussing the above problems.   Medication Adjustments/Labs and Tests Ordered: Current medicines are reviewed at length with the patient today.  Concerns regarding medicines are outlined above.   Tests Ordered: No orders of the defined types were placed in this encounter.   Medication  Changes: No orders of the defined types were placed in this encounter.    Note: This dictation was prepared with Dragon dictation along with smaller phrase technology. Similar sounding words can be transcribed inadequately or may not be corrected upon review. Any transcriptional errors that result from this process are unintentional.      Disposition:  Follow up  Signed, [facility], MD  04/04/2021 10:48 AM     06/04/2021 [name]

## 2021-04-04 NOTE — Patient Instructions (Signed)
Please start taking Buspirone 7.5 mg twice daily instead of 5 mg. Okay to take one and a half tablet of Buspirone twice daily until you run out of current supply.  Please continue taking other medications as prescribed.  You are being referred to Behavioral health therapist.

## 2021-04-05 ENCOUNTER — Other Ambulatory Visit: Payer: Self-pay

## 2021-04-05 ENCOUNTER — Ambulatory Visit (INDEPENDENT_AMBULATORY_CARE_PROVIDER_SITE_OTHER): Payer: HMO | Admitting: Licensed Clinical Social Worker

## 2021-04-05 DIAGNOSIS — F4321 Adjustment disorder with depressed mood: Secondary | ICD-10-CM

## 2021-04-05 NOTE — Progress Notes (Signed)
Writer contacted Patient and was able to introduce self; Patient stated that she was in the middle of "writing obituary for her son" and will call Writer back.

## 2021-04-21 ENCOUNTER — Other Ambulatory Visit: Payer: Self-pay | Admitting: Nurse Practitioner

## 2021-05-04 ENCOUNTER — Other Ambulatory Visit: Payer: Self-pay | Admitting: Nurse Practitioner

## 2021-05-06 ENCOUNTER — Other Ambulatory Visit: Payer: Self-pay

## 2021-05-06 ENCOUNTER — Emergency Department (HOSPITAL_COMMUNITY): Payer: HMO

## 2021-05-06 ENCOUNTER — Encounter (HOSPITAL_COMMUNITY): Payer: Self-pay

## 2021-05-06 ENCOUNTER — Emergency Department (HOSPITAL_COMMUNITY)
Admission: EM | Admit: 2021-05-06 | Discharge: 2021-05-06 | Disposition: A | Discharge status: Home / Self Care | Payer: HMO | Attending: Emergency Medicine | Admitting: Emergency Medicine

## 2021-05-06 DIAGNOSIS — I10 Essential (primary) hypertension: Secondary | ICD-10-CM | POA: Diagnosis not present

## 2021-05-06 DIAGNOSIS — R0609 Other forms of dyspnea: Secondary | ICD-10-CM | POA: Diagnosis not present

## 2021-05-06 DIAGNOSIS — Z79899 Other long term (current) drug therapy: Secondary | ICD-10-CM | POA: Diagnosis not present

## 2021-05-06 DIAGNOSIS — R0789 Other chest pain: Secondary | ICD-10-CM | POA: Diagnosis not present

## 2021-05-06 DIAGNOSIS — R0600 Dyspnea, unspecified: Secondary | ICD-10-CM | POA: Diagnosis not present

## 2021-05-06 DIAGNOSIS — R079 Chest pain, unspecified: Secondary | ICD-10-CM | POA: Insufficient documentation

## 2021-05-06 DIAGNOSIS — R0602 Shortness of breath: Secondary | ICD-10-CM | POA: Diagnosis not present

## 2021-05-06 DIAGNOSIS — N281 Cyst of kidney, acquired: Secondary | ICD-10-CM | POA: Diagnosis not present

## 2021-05-06 DIAGNOSIS — E1142 Type 2 diabetes mellitus with diabetic polyneuropathy: Secondary | ICD-10-CM | POA: Insufficient documentation

## 2021-05-06 DIAGNOSIS — R42 Dizziness and giddiness: Secondary | ICD-10-CM | POA: Diagnosis not present

## 2021-05-06 DIAGNOSIS — Z7984 Long term (current) use of oral hypoglycemic drugs: Secondary | ICD-10-CM | POA: Diagnosis not present

## 2021-05-06 DIAGNOSIS — I2510 Atherosclerotic heart disease of native coronary artery without angina pectoris: Secondary | ICD-10-CM | POA: Diagnosis not present

## 2021-05-06 DIAGNOSIS — Z20822 Contact with and (suspected) exposure to covid-19: Secondary | ICD-10-CM | POA: Diagnosis not present

## 2021-05-06 DIAGNOSIS — I517 Cardiomegaly: Secondary | ICD-10-CM | POA: Diagnosis not present

## 2021-05-06 DIAGNOSIS — I700 Atherosclerosis of aorta: Secondary | ICD-10-CM | POA: Diagnosis not present

## 2021-05-06 LAB — COMPREHENSIVE METABOLIC PANEL
ALT: 16 U/L (ref 0–44)
AST: 28 U/L (ref 15–41)
Albumin: 4.1 g/dL (ref 3.5–5.0)
Alkaline Phosphatase: 52 U/L (ref 38–126)
Anion gap: 9 (ref 5–15)
BUN: 12 mg/dL (ref 8–23)
CO2: 25 mmol/L (ref 22–32)
Calcium: 8.8 mg/dL — ABNORMAL LOW (ref 8.9–10.3)
Chloride: 104 mmol/L (ref 98–111)
Creatinine, Ser: 1.1 mg/dL — ABNORMAL HIGH (ref 0.44–1.00)
GFR, Estimated: 53 mL/min — ABNORMAL LOW (ref >60)
Glucose, Bld: 256 mg/dL — ABNORMAL HIGH (ref 70–99)
Potassium: 3.3 mmol/L — ABNORMAL LOW (ref 3.5–5.1)
Sodium: 138 mmol/L (ref 135–145)
Total Bilirubin: 0.7 mg/dL (ref 0.3–1.2)
Total Protein: 7.1 g/dL (ref 6.5–8.1)

## 2021-05-06 LAB — RESP PANEL BY RT-PCR (FLU A&B, COVID) ARPGX2
Influenza A by PCR: NEGATIVE
Influenza B by PCR: NEGATIVE
SARS Coronavirus 2 by RT PCR: NEGATIVE

## 2021-05-06 LAB — CBC
HCT: 44.7 % (ref 36.0–46.0)
Hemoglobin: 13.7 g/dL (ref 12.0–15.0)
MCH: 28.7 pg (ref 26.0–34.0)
MCHC: 30.6 g/dL (ref 30.0–36.0)
MCV: 93.7 fL (ref 80.0–100.0)
Platelets: 223 10*3/uL (ref 150–400)
RBC: 4.77 MIL/uL (ref 3.87–5.11)
RDW: 16 % — ABNORMAL HIGH (ref 11.5–15.5)
WBC: 9.5 10*3/uL (ref 4.0–10.5)
nRBC: 0 % (ref 0.0–0.2)

## 2021-05-06 LAB — TROPONIN I (HIGH SENSITIVITY)
Troponin I (High Sensitivity): 6 ng/L (ref <18)
Troponin I (High Sensitivity): 5 ng/L (ref <18)

## 2021-05-06 LAB — BRAIN NATRIURETIC PEPTIDE: B Natriuretic Peptide: 74 pg/mL (ref 0.0–100.0)

## 2021-05-06 MED ORDER — FENTANYL CITRATE PF 50 MCG/ML IJ SOSY
50.0000 ug | PREFILLED_SYRINGE | Freq: Once | INTRAMUSCULAR | Status: AC
  Administered 2021-05-06: 50 ug via INTRAVENOUS
  Filled 2021-05-06: qty 1

## 2021-05-06 MED ORDER — IOHEXOL 350 MG/ML SOLN
75.0000 mL | Freq: Once | INTRAVENOUS | Status: AC | PRN
  Administered 2021-05-06: 75 mL via INTRAVENOUS

## 2021-05-06 MED ORDER — ALBUTEROL SULFATE HFA 108 (90 BASE) MCG/ACT IN AERS
2.0000 | INHALATION_SPRAY | Freq: Once | RESPIRATORY_TRACT | Status: AC
  Administered 2021-05-06: 2 via RESPIRATORY_TRACT
  Filled 2021-05-06: qty 6.7

## 2021-05-06 NOTE — ED Triage Notes (Signed)
PA performed MSE prior to triage.  Pt reports has history of sarcoidosis and reports started having chest pain yesterday morning.  Says lasted approx 2 1/2 hours and it went away on its own.  Says it came back worse this morning around 0730.

## 2021-05-06 NOTE — ED Notes (Signed)
PA aware of bp 92/66  after recheck.

## 2021-05-06 NOTE — ED Provider Notes (Signed)
[facility] EMERGENCY DEPARTMENT Provider Note   CSN: [id number] Arrival date & time: 05/06/21  1159     History Chief Complaint  Patient presents with   Chest Pain    [name] is a 75 y.o. female.  With past medical history of sarcoidosis, GERD, type 2 diabetes, hypertension presents emergency department for chest pain.  States that she "thinks that her sarcoidosis has flared up again."  Describes having chest pain that started yesterday morning and abated itself.  States that she then began having chest pain again this morning at 730 until she presented to the emergency department.  She describes the pain as "grabbing and radiating to the back."  Also endorses shortness of breath, lightheadedness, dizziness.  Denies nausea, vomiting, cough, fever, URI symptoms.   Appears that she had biopsy of lung nodules in 2007 which were consistent with sarcoidosis.  At that time was placed on prednisone, however she states she has not taken this in years.  Chest Pain Associated symptoms: dizziness and shortness of breath   Associated symptoms: no cough, no fever, no nausea, no palpitations and no vomiting   Chest Pain Associated symptoms: dizziness and shortness of breath   Associated symptoms: no cough, no fever, no nausea, no palpitations and no vomiting   Chest Pain Associated symptoms: shortness of breath   Associated symptoms: no cough, no fever, no nausea, no palpitations and no vomiting   Chest Pain Associated symptoms: shortness of breath   Associated symptoms: no cough, no fever, no nausea and no palpitations   Chest Pain Associated symptoms: shortness of breath   Associated symptoms: no cough, no fever and no palpitations   Chest Pain Associated symptoms: shortness of breath   Associated symptoms: no cough and no fever   Chest Pain Associated symptoms: no cough and no fever   Chest Pain Associated symptoms: no fever       Past Medical History:  Diagnosis Date    Arthritis    Coronary artery disease    Diabetes mellitus without complication (HCC)    Encephalopathy 03/02/2014   Hyperlipidemia    Hypertension    Osteoporosis    Referred otalgia of right ear 02/11/2017   Renal disorder    Sarcoidosis    Throat pain 03/03/2020    Patient Active Problem List   Diagnosis Date Noted   Overactive bladder 10/11/2020   Anxiety 09/13/2020   Tongue pain 04/22/2020   Gastroesophageal reflux disease 03/03/2020   Laryngopharyngeal reflux (LPR) 03/03/2020   Depression, major, single episode, moderate (HCC) 12/02/2019   Diabetes mellitus without complication (HCC) 12/02/2019   Hyperlipidemia 12/02/2019   Essential hypertension 12/02/2019   Lumbar radiculopathy 10/31/2019   Polyarthropathy 10/31/2019   Polyneuropathy due to type 2 diabetes mellitus (HCC) 10/31/2019   Chronic pain syndrome 10/31/2019   Bilateral sensorineural hearing loss 02/11/2017   Right ear impacted cerumen 02/11/2017   Sarcoidosis 03/02/2014   Low back pain 03/02/2014    Past Surgical History:  Procedure Laterality Date   ANKLE SURGERY     BACK SURGERY     FRACTURE SURGERY     tibia      OB History   No obstetric history on file.     Family History  Problem Relation Age of Onset   Hypertension Mother    Hyperlipidemia Mother    Throat cancer Father     Social History   Tobacco Use   Smoking status: Never   Smokeless tobacco: Never  Substance  Use Topics   Alcohol use: No   Drug use: No    Home Medications Prior to Admission medications   Medication Sig Start Date End Date Taking? Authorizing Provider  albuterol (VENTOLIN HFA) 108 (90 Base) MCG/ACT inhaler Inhale 1-2 puffs into the lungs every 6 (six) hours as needed for wheezing or shortness of breath. 10/22/20   Avegno, Zachery Dakins, FNP  alendronate (FOSAMAX) 70 MG tablet TAKE 1 TABLET BY MOUTH ONCE WEEKLY. 03/20/21   Heather Roberts, NP  busPIRone (BUSPAR) 7.5 MG tablet Take 1 tablet (7.5 mg total) by mouth 2  (two) times daily. 04/04/21   Anabel Halon, MD  cetirizine (ZYRTEC) 5 MG tablet Take 1 tablet (5 mg total) by mouth daily. 10/22/20   Avegno, Zachery Dakins, FNP  fentaNYL (DURAGESIC) 25 MCG/HR patch Place 1 patch (25 mcg total) onto the skin every 3 (three) days. 03/05/14   Kari Baars, MD  furosemide (LASIX) 40 MG tablet TAKE 1 TABLET BY MOUTH TWICE A DAY. 04/21/21   Heather Roberts, NP  glipiZIDE (GLUCOTROL XL) 5 MG 24 hr tablet Take 5 mg by mouth daily with breakfast.    [provider]  hydroxychloroquine (PLAQUENIL) 200 MG tablet Take 1 tablet (200 mg total) by mouth daily. 09/14/20   Freddy Finner, NP  lisinopril (ZESTRIL) 2.5 MG tablet TAKE ONE TABLET BY MOUTH ONCE DAILY FOR BLOOD PRESSURE. 05/04/21   Heather Roberts, NP  magic mouthwash w/lidocaine SOLN Take 5 mLs by mouth 2 (two) times daily as needed for mouth pain. 04/21/20   Freddy Finner, NP  menthol-cetylpyridinium (CEPACOL) 3 MG lozenge Take 1 lozenge (3 mg total) by mouth as needed for sore throat. 01/07/20   Avegno, Zachery Dakins, FNP  methocarbamol (ROBAXIN) 500 MG tablet Take 500 mg by mouth every 6 (six) hours as needed for muscle spasms.    [provider]  neomycin-polymyxin-hydrocortisone (CORTISPORIN) 3.5-10000-1 OTIC suspension Place 4 drops into the right ear 3 (three) times daily. 10/22/20   Avegno, Zachery Dakins, FNP  oxybutynin (DITROPAN) 5 MG tablet Take 1 tablet (5 mg total) by mouth 3 (three) times daily. 11/14/20   Heather Roberts, NP  oxycodone (OXY-IR) 5 MG capsule Take 1 capsule (5 mg total) by mouth 4 (four) times daily as needed for pain. 03/05/14   Kari Baars, MD  oxyCODONE-acetaminophen (PERCOCET) 5-325 MG tablet Take 2 tablets by mouth every 6 (six) hours as needed. 11/19/20   Bethann Berkshire, MD  pantoprazole (PROTONIX) 40 MG tablet Take 40 mg by mouth 2 (two) times daily.    [provider]  potassium chloride SA (KLOR-CON) 20 MEQ tablet Take 1 tablet (20 mEq total) by mouth 2 (two) times  daily. 09/14/20   Freddy Finner, NP  pregabalin (LYRICA) 50 MG capsule Take 1 capsule (50 mg total) by mouth at bedtime. 03/05/14   Kari Baars, MD  sertraline (ZOLOFT) 100 MG tablet TAKE 1 1/2 TABLETS (150MG ) BY MOUTH DAILY 03/20/21   03/22/21, NP  simvastatin (ZOCOR) 40 MG tablet TAKE (1) TABLET BY MOUTH AT BEDTIME. 04/21/21   04/23/21, NP  sodium bicarbonate 650 MG tablet TAKE (1) TABLET BY MOUTH TWICE DAILY. 02/13/21   02/15/21, NP  traMADol (ULTRAM) 50 MG tablet Take 1 tablet (50 mg total) by mouth every 6 (six) hours as needed. 01/18/17   01/20/17, MD  Vitamin D, Ergocalciferol, (DRISDOL) 1.25 MG (50000 UNIT) CAPS capsule TAKE 1 CAPSULE THE SAME  DAY EACH WEEK. 03/20/21   Heather Roberts, NP    Allergies    Darvon [propoxyphene], Morphine and related, and Penicillins  Review of Systems   Review of Systems  Constitutional:  Negative for fever.  Respiratory:  Positive for chest tightness and shortness of breath. Negative for cough.   Cardiovascular:  Positive for chest pain. Negative for palpitations and leg swelling.  Gastrointestinal:  Negative for diarrhea, nausea and vomiting.  Neurological:  Positive for dizziness and light-headedness.  All other systems reviewed and are negative.  Physical Exam Updated Vital Signs BP (!) 104/54   Pulse 84   Temp 98.4 F (36.9 C) (Oral)   Resp 18   Ht 5\' 8"  (1.727 m)   Wt 79.4 kg   SpO2 96%   BMI 26.61 kg/m   Physical Exam Vitals and nursing note reviewed.  Constitutional:      General: She is not in acute distress. HENT:     Head: Normocephalic and atraumatic.  Eyes:     General: No scleral icterus. Cardiovascular:     Rate and Rhythm: Normal rate and regular rhythm.     Pulses:          Radial pulses are 2+ on the right side and 2+ on the left side.     Heart sounds: Normal heart sounds.  Pulmonary:     Effort: Pulmonary effort is normal. No respiratory distress.     Breath sounds: Normal breath  sounds.  Chest:     Chest wall: No tenderness.  Abdominal:     General: Bowel sounds are normal.     Palpations: Abdomen is soft.  Musculoskeletal:     Cervical back: Normal range of motion and neck supple.     Right lower leg: No edema.     Left lower leg: No edema.  Skin:    General: Skin is warm and dry.     Capillary Refill: Capillary refill takes less than 2 seconds.     Findings: No rash.  Neurological:     General: No focal deficit present.     Mental Status: She is alert and oriented to person, place, and time.  Psychiatric:        Mood and Affect: Mood normal.        Behavior: Behavior normal.        Thought Content: Thought content normal.        Judgment: Judgment normal.    ED Results / Procedures / Treatments   Labs (all labs ordered are listed, but only abnormal results are displayed) Labs Reviewed  CBC - Abnormal; Notable for the following components:      Result Value   RDW 16.0 (*)    All other components within normal limits  COMPREHENSIVE METABOLIC PANEL - Abnormal; Notable for the following components:   Potassium 3.3 (*)    Glucose, Bld 256 (*)    Creatinine, Ser 1.10 (*)    Calcium 8.8 (*)    GFR, Estimated 53 (*)    All other components within normal limits  BRAIN NATRIURETIC PEPTIDE  TROPONIN I (HIGH SENSITIVITY)  TROPONIN I (HIGH SENSITIVITY)    EKG EKG Interpretation  Date/Time:  Saturday May 06 2021 12:43:00 EDT Ventricular Rate:  89 PR Interval:  157 QRS Duration: 105 QT Interval:  385 QTC Calculation: 469 R Axis:   82 Text Interpretation: Sinus rhythm Borderline right axis deviation Since last tracing T wave inversion have improved Confirmed by 03-30-1983 (Eber Hong)  on 05/06/2021 1:17:32 PM  Radiology DG Chest 2 View  Result Date: 05/06/2021 CLINICAL DATA:  Chest pain and shortness of breath starting yesterday. EXAM: CHEST - 2 VIEW COMPARISON:  October 22, 2020 FINDINGS: The heart size and mediastinal contours are within  normal limits. Both lungs are clear. The visualized skeletal structures are unremarkable. IMPRESSION: No active cardiopulmonary disease. Electronically Signed   By: Sherian ReinWei-Chen  Lin M.D.   On: 05/06/2021 12:47    Procedures Procedures   Medications Ordered in ED Medications  albuterol (VENTOLIN HFA) 108 (90 Base) MCG/ACT inhaler 2 puff (has no administration in time range)    ED Course  I have reviewed the triage vital signs and the nursing notes.  Pertinent labs & imaging results that were available during my care of the patient were reviewed by me and considered in my medical decision making (see chart for details).   MDM Rules/Calculators/A&P [name] is a 75 year old female who presented to the emergency department complaining of chest pain.  Her exam is without evidence of fluid volume overload so doubt heart failure.  BNP 74 EKG without signs of ischemia or infarction.  X2 troponin negative.  Doubt ACS. Her presentation is not consistent with an acute pulmonary embolism as she does not have tachycardia, hypoxia, history not consistent with risk factors. Chest x-ray and rule out of pneumothorax.  Physical exam and history not consistent with aortic dissection. She has no infectious symptoms, will WBC 9.5, afebrile so low suspicion for pericarditis, myocarditis.  She does have a history of sarcoidosis and being on chronic steroids.  She has not taken steroids" years." Discussed case with Dr. Hyacinth MeekerMiller.  Reasonable for discharge at this time given reassuring work-up.  She may follow-up with primary care regarding sarcoid management. Final Clinical Impression(s) / ED Diagnoses Final diagnoses:  Chest pain, unspecified type    Rx / DC Orders ED Discharge Orders     None        Cristopher Peruutry, Billye Nydam E, PA-C 05/06/21 1521    Eber HongMiller, Brian, MD 05/07/21 [phone]

## 2021-05-06 NOTE — ED Notes (Signed)
Checked on pt to se if she needed anything . She asked for water going to check with the nurse to make sure she can have water.

## 2021-05-06 NOTE — ED Notes (Signed)
Patient ambulated in hall, hr remained in 90's to 102. O2 sats dropped to 93 on Room air, but recovered to 96%. Patient denies shortness of breath or discomfort. Attending aware.

## 2021-05-06 NOTE — Discharge Instructions (Addendum)
You were seen in the emergency department today for having chest pain.  We performed an EKG which was reassuring that you are not having a heart attack.  Additionally we did a chest x-ray which did not show any sarcoidosis granulomas.  All of your lab work was reassuring.  At this time it safe for your discharge.  He should follow-up with your primary care provider regarding your emergency department visit and ongoing management of your chronic health conditions.  Please return to the emergency department for any reason.  It is also important that you return to the emergency department with for symptoms similar today including chest pain, shortness of breath associated with lightheadedness or dizziness, nausea or vomiting, diaphoresis.  Take your oxycodone 10/325 as needed for pain.

## 2021-05-06 NOTE — ED Notes (Addendum)
When pt first got to walk her oxygen was 100. After walking for a min her oxygen dropped down to 70. As pt had sat backed down onto the bed her oxygen started to come back up. Pt states that her chest is hurting to breath. Pt said that she noticed chest hurting when she laid back down on the bed. Room [facility]

## 2021-05-06 NOTE — ED Provider Notes (Signed)
The nurse tech ambulated patient at 1714 and said her O2 sat dropped to 70.  I did not see the wave form, but suspect this was not accurate.  I did add on a Covid swab and CTA to r/o PE.  These were both negative.  Pt given some fentanyl and feels much better.  She is able to ambulate with O2 sats in the mid-90s.  Pt just had 120 pills of percocet 10/325 filled on the 24th of August.  She is encouraged to take that for pain as needed.  Return if worse.  F/u with pcp.   Jacalyn Lefevre, MD 05/06/21 2011

## 2021-05-06 NOTE — ED Provider Notes (Addendum)
Emergency Medicine Provider Triage Evaluation Note  [name] , a 75 y.o. female  was evaluated in triage.  Pt complains of thinking "sarcoidosis has flared up again." Yesterday had pain in chest that abated and then began again at 7:30 am until arrival to ED. Describes pain as "grabbing and radiating to back." Also endorses shortness or breath, lightheadedness, dizziness.   Review of Systems  Positive: Chest pain, shortness of breath, lightheadedness, dizziness  Negative: Nausea, vomiting  Physical Exam  BP 111/84 (BP Location: Left Arm)   Pulse (!) 108   Temp 99 F (37.2 C) (Oral)   Resp 19   SpO2 98%  Gen:   Awake, no distress   Cardiac: S1/S2 without murmurs, rubs gallops Resp:  Normal effort  MSK:   Moves extremities without difficulty    Medical Decision Making  Medically screening exam initiated at 12:02 PM.  Appropriate orders placed.  [place] P Coronado was informed that the remainder of the evaluation will be completed by another provider, this initial triage assessment does not replace that evaluation, and the importance of remaining in the ED until their evaluation is complete.     Cristopher Peru, PA-C 05/06/21 1207    Cristopher Peru, PA-C 05/06/21 1215    Eber Hong, MD 05/07/21 [phone]

## 2021-05-08 ENCOUNTER — Other Ambulatory Visit: Payer: Self-pay | Admitting: Nurse Practitioner

## 2021-05-12 ENCOUNTER — Other Ambulatory Visit: Payer: Self-pay

## 2021-05-12 MED ORDER — POTASSIUM CHLORIDE CRYS ER 20 MEQ PO TBCR: 20.0000 meq | EXTENDED_RELEASE_TABLET | Freq: Two times a day (BID) | ORAL | 3 refills | Status: DC

## 2021-05-16 DIAGNOSIS — G894 Chronic pain syndrome: Secondary | ICD-10-CM | POA: Diagnosis not present

## 2021-05-16 DIAGNOSIS — Z79891 Long term (current) use of opiate analgesic: Secondary | ICD-10-CM | POA: Diagnosis not present

## 2021-05-16 DIAGNOSIS — E1142 Type 2 diabetes mellitus with diabetic polyneuropathy: Secondary | ICD-10-CM | POA: Diagnosis not present

## 2021-05-16 DIAGNOSIS — M5416 Radiculopathy, lumbar region: Secondary | ICD-10-CM | POA: Diagnosis not present

## 2021-05-16 DIAGNOSIS — M130 Polyarthritis, unspecified: Secondary | ICD-10-CM | POA: Diagnosis not present

## 2021-05-16 DIAGNOSIS — M1711 Unilateral primary osteoarthritis, right knee: Secondary | ICD-10-CM | POA: Diagnosis not present

## 2021-05-16 DIAGNOSIS — M5459 Other low back pain: Secondary | ICD-10-CM | POA: Diagnosis not present

## 2021-05-16 DIAGNOSIS — R002 Palpitations: Secondary | ICD-10-CM | POA: Diagnosis not present

## 2021-05-16 DIAGNOSIS — G4700 Insomnia, unspecified: Secondary | ICD-10-CM | POA: Diagnosis not present

## 2021-05-19 ENCOUNTER — Other Ambulatory Visit: Payer: Self-pay | Admitting: Nurse Practitioner

## 2021-05-22 ENCOUNTER — Other Ambulatory Visit: Payer: Self-pay | Admitting: Nurse Practitioner

## 2021-05-30 ENCOUNTER — Other Ambulatory Visit: Payer: Self-pay

## 2021-05-30 MED ORDER — SIMVASTATIN 40 MG PO TABS: ORAL_TABLET | ORAL | 2 refills | Status: DC

## 2021-06-05 ENCOUNTER — Other Ambulatory Visit: Payer: Self-pay | Admitting: Nurse Practitioner

## 2021-06-06 ENCOUNTER — Other Ambulatory Visit: Payer: Self-pay | Admitting: Nurse Practitioner

## 2021-06-16 ENCOUNTER — Other Ambulatory Visit: Payer: Self-pay | Admitting: *Deleted

## 2021-06-16 MED ORDER — SODIUM BICARBONATE 650 MG PO TABS: 650.0000 mg | ORAL_TABLET | Freq: Two times a day (BID) | ORAL | 1 refills | Status: DC

## 2021-06-28 ENCOUNTER — Other Ambulatory Visit (HOSPITAL_COMMUNITY): Payer: Self-pay | Admitting: Nurse Practitioner

## 2021-06-28 DIAGNOSIS — Z1231 Encounter for screening mammogram for malignant neoplasm of breast: Secondary | ICD-10-CM

## 2021-06-29 ENCOUNTER — Other Ambulatory Visit: Payer: Self-pay | Admitting: Internal Medicine

## 2021-06-29 DIAGNOSIS — F419 Anxiety disorder, unspecified: Secondary | ICD-10-CM

## 2021-07-05 ENCOUNTER — Other Ambulatory Visit: Payer: Self-pay

## 2021-07-05 ENCOUNTER — Ambulatory Visit (HOSPITAL_COMMUNITY)
Admission: RE | Admit: 2021-07-05 | Discharge: 2021-07-05 | Disposition: A | Discharge status: Home / Self Care | Payer: HMO | Source: Ambulatory Visit | Attending: Nurse Practitioner | Admitting: Nurse Practitioner

## 2021-07-05 DIAGNOSIS — Z1231 Encounter for screening mammogram for malignant neoplasm of breast: Secondary | ICD-10-CM | POA: Insufficient documentation

## 2021-07-06 ENCOUNTER — Other Ambulatory Visit: Payer: Self-pay | Admitting: Nurse Practitioner

## 2021-07-24 ENCOUNTER — Ambulatory Visit: Payer: HMO | Admitting: Nurse Practitioner

## 2021-07-25 ENCOUNTER — Other Ambulatory Visit: Payer: Self-pay | Admitting: Nurse Practitioner

## 2021-07-28 ENCOUNTER — Other Ambulatory Visit: Payer: Self-pay | Admitting: Nurse Practitioner

## 2021-08-02 NOTE — Progress Notes (Signed)
Mammogram is negative; repeat in 1 year.

## 2021-08-07 ENCOUNTER — Other Ambulatory Visit: Payer: Self-pay

## 2021-08-07 DIAGNOSIS — G894 Chronic pain syndrome: Secondary | ICD-10-CM | POA: Diagnosis not present

## 2021-08-07 DIAGNOSIS — M5459 Other low back pain: Secondary | ICD-10-CM | POA: Diagnosis not present

## 2021-08-07 DIAGNOSIS — G4700 Insomnia, unspecified: Secondary | ICD-10-CM | POA: Diagnosis not present

## 2021-08-07 DIAGNOSIS — R002 Palpitations: Secondary | ICD-10-CM | POA: Diagnosis not present

## 2021-08-07 DIAGNOSIS — E1142 Type 2 diabetes mellitus with diabetic polyneuropathy: Secondary | ICD-10-CM | POA: Diagnosis not present

## 2021-08-07 DIAGNOSIS — M1711 Unilateral primary osteoarthritis, right knee: Secondary | ICD-10-CM | POA: Diagnosis not present

## 2021-08-07 DIAGNOSIS — Z79891 Long term (current) use of opiate analgesic: Secondary | ICD-10-CM | POA: Diagnosis not present

## 2021-08-07 DIAGNOSIS — M130 Polyarthritis, unspecified: Secondary | ICD-10-CM | POA: Diagnosis not present

## 2021-08-07 DIAGNOSIS — M5416 Radiculopathy, lumbar region: Secondary | ICD-10-CM | POA: Diagnosis not present

## 2021-08-07 DIAGNOSIS — F419 Anxiety disorder, unspecified: Secondary | ICD-10-CM

## 2021-08-07 DIAGNOSIS — G521 Disorders of glossopharyngeal nerve: Secondary | ICD-10-CM | POA: Diagnosis not present

## 2021-08-07 MED ORDER — BUSPIRONE HCL 7.5 MG PO TABS: ORAL_TABLET | ORAL | 0 refills | Status: DC

## 2021-08-08 ENCOUNTER — Other Ambulatory Visit: Payer: Self-pay | Admitting: Family Medicine

## 2021-08-08 ENCOUNTER — Other Ambulatory Visit: Payer: Self-pay | Admitting: Nurse Practitioner

## 2021-08-11 ENCOUNTER — Ambulatory Visit (INDEPENDENT_AMBULATORY_CARE_PROVIDER_SITE_OTHER): Payer: HMO | Admitting: Nurse Practitioner

## 2021-08-11 ENCOUNTER — Other Ambulatory Visit: Payer: Self-pay

## 2021-08-11 ENCOUNTER — Encounter: Payer: Self-pay | Admitting: Nurse Practitioner

## 2021-08-11 VITALS — BP 134/78 | HR 91 | Ht 68.0 in | Wt 180.0 lb

## 2021-08-11 DIAGNOSIS — Z139 Encounter for screening, unspecified: Secondary | ICD-10-CM | POA: Diagnosis not present

## 2021-08-11 DIAGNOSIS — I10 Essential (primary) hypertension: Secondary | ICD-10-CM | POA: Diagnosis not present

## 2021-08-11 DIAGNOSIS — E782 Mixed hyperlipidemia: Secondary | ICD-10-CM | POA: Diagnosis not present

## 2021-08-11 DIAGNOSIS — G4709 Other insomnia: Secondary | ICD-10-CM

## 2021-08-11 DIAGNOSIS — F321 Major depressive disorder, single episode, moderate: Secondary | ICD-10-CM | POA: Diagnosis not present

## 2021-08-11 DIAGNOSIS — Z23 Encounter for immunization: Secondary | ICD-10-CM

## 2021-08-11 DIAGNOSIS — E559 Vitamin D deficiency, unspecified: Secondary | ICD-10-CM | POA: Diagnosis not present

## 2021-08-11 DIAGNOSIS — E119 Type 2 diabetes mellitus without complications: Secondary | ICD-10-CM

## 2021-08-11 DIAGNOSIS — E1165 Type 2 diabetes mellitus with hyperglycemia: Secondary | ICD-10-CM | POA: Diagnosis not present

## 2021-08-11 NOTE — Assessment & Plan Note (Signed)
Takes simvastatin 40mg  daily. Lipid panel today.avoid fried fatty food.

## 2021-08-11 NOTE — Assessment & Plan Note (Signed)
DASH diet and commitment to daily physical activity for a minimum of 30 minutes discussed and encouraged, as a part of hypertension management. The importance of attaining a healthy weight is also discussed.  BP/Weight 08/11/2021 05/06/2021 01/25/2021 12/08/2020 11/19/2020 11/17/2020 11/08/2020  Systolic BP 134 118 140 120 150 115 131  Diastolic BP 78 67 86 84 74 67 88  Wt. (Lbs) 180 175 201 204 200 - 206  BMI 27.37 26.61 30.56 31.02 30.41 - 31.32

## 2021-08-11 NOTE — Progress Notes (Signed)
° °  [name]     MRN: [id number]      DOB: 07-Jun-1946   HPI [name] is here for follow up and re-evaluation of chronic medical conditions, medication management and review of any available recent lab and radiology data.  Preventive health is updated, specifically  Cancer screening and Immunization.   Questions or concerns regarding consultations or procedures which the PT has had in the interim are  addressed. The PT denies any adverse reactions to current medications since the last visit.    PT  lost [name] on August 8th, 2022 [name] was 75 year old, PT is having a really hard time coping ,she has been taking buspirone 7.5 mg daily, Cymbalta 30 mg two times daily,Zoloft 150 mg daily. she can not sleep well, seep 3 hours of sleep.Pt lives with  husband and has another [name] with 8 grandchildren. Tiffany family have been very supportive.  She had a session with a Counsellor shortly after [name] passed. Pt does not want to talk to talk to a Counsellor. Denies suicidal ideation or thought of harming anyone.  PT is due for colonoscopy,foot exam, flu  TDAP, shingles vaccine  and COVID booster vaccine.  ROS Denies recent fever or chills. Denies sinus pressure, nasal congestion, ear pain or sore throat. Denies chest congestion, productive cough or wheezing. Denies chest pains, palpitations and leg swelling Denies abdominal pain, nausea, vomiting,diarrhea or constipation.   Denies dysuria, frequency, hesitancy or incontinence. Has chronic low back pain no swelling and limitation in mobility. Denies headaches, seizures, numbness, or tingling. Has depression, anxiety or insomnia. Denies skin break down or rash.   PE  BP 134/78 (BP Location: Left Arm, Patient Position: Sitting, Cuff Size: Normal)    Pulse 91    Ht 5\' 8"  (1.727 m)    Wt 180 lb (81.6 kg)    SpO2 97%    BMI 27.37 kg/m   Patient alert and oriented and in no cardiopulmonary distress.  HEENT: No facial asymmetry, EOMI,     Neck  supple .  Chest: Clear to auscultation bilaterally.  CVS: S1, S2 no murmurs, no S3.Regular rate.  ABD: Soft non tender.   Ext: No edema  MS: Adequate ROM spine, shoulders, hips and knees.  Skin: Intact, no ulcerations or rash noted.  Psych: Good eye contact, normal affect. Memory intact. Pt is tearful, denies suicidal ideation or thoughts of harming anyone.   CNS: CN 2-12 intact, power,  normal throughout.no focal deficits noted.   Assessment & Plan

## 2021-08-11 NOTE — Assessment & Plan Note (Signed)
Takes glipizide 5mg  daily. A1c will be checked today.

## 2021-08-11 NOTE — Patient Instructions (Signed)
Please get your labs done today.  Get your shingles and TDAP COVID Booster at your pharmacy.   It is important that you exercise regularly at least 30 minutes 5 times a week.  Think about what you will eat, plan ahead. Choose " clean, green, fresh or frozen" over canned, processed or packaged foods which are more sugary, salty and fatty. 70 to 75% of food eaten should be vegetables and fruit. Three meals at set times with snacks allowed between meals, but they must be fruit or vegetables. Aim to eat over a 12 hour period , example 7 am to 7 pm, and STOP after  your last meal of the day. Drink water,generally about 64 ounces per day, no other drink is as healthy. Fruit juice is best enjoyed in a healthy way, by EATING the fruit.  Thanks for choosing [facility], we consider it a privelige to serve you.

## 2021-08-11 NOTE — Assessment & Plan Note (Signed)
Takes Buspar 7.5mg  daily, Cymbalta 30mg  BID, Zolofot 150mg  daily.  Pt refused referral for  Counseling.  still grieving the loss of her son Denies suicidal ideation or thoughts of harming anyone.

## 2021-08-12 DIAGNOSIS — G4700 Insomnia, unspecified: Secondary | ICD-10-CM | POA: Insufficient documentation

## 2021-08-12 LAB — HEMOGLOBIN A1C
Est. average glucose Bld gHb Est-mCnc: 103 mg/dL
Hgb A1c MFr Bld: 5.2 % (ref 4.8–5.6)

## 2021-08-12 LAB — CBC WITH DIFFERENTIAL/PLATELET
Basophils Absolute: 0.1 10*3/uL (ref 0.0–0.2)
Basos: 2 %
EOS (ABSOLUTE): 0.1 10*3/uL (ref 0.0–0.4)
Eos: 2 %
Hematocrit: 38.3 % (ref 34.0–46.6)
Hemoglobin: 12.5 g/dL (ref 11.1–15.9)
Immature Grans (Abs): 0 10*3/uL (ref 0.0–0.1)
Immature Granulocytes: 0 %
Lymphocytes Absolute: 1.5 10*3/uL (ref 0.7–3.1)
Lymphs: 44 %
MCH: 28.6 pg (ref 26.6–33.0)
MCHC: 32.6 g/dL (ref 31.5–35.7)
MCV: 88 fL (ref 79–97)
Monocytes Absolute: 0.2 10*3/uL (ref 0.1–0.9)
Monocytes: 6 %
Neutrophils Absolute: 1.6 10*3/uL (ref 1.4–7.0)
Neutrophils: 46 %
Platelets: 209 10*3/uL (ref 150–450)
RBC: 4.37 x10E6/uL (ref 3.77–5.28)
RDW: 14.7 % (ref 11.7–15.4)
WBC: 3.4 10*3/uL (ref 3.4–10.8)

## 2021-08-12 LAB — LIPID PANEL WITH LDL/HDL RATIO
Cholesterol, Total: 204 mg/dL — ABNORMAL HIGH (ref 100–199)
HDL: 70 mg/dL (ref >39)
LDL Chol Calc (NIH): 114 mg/dL — ABNORMAL HIGH (ref 0–99)
LDL/HDL Ratio: 1.6 ratio (ref 0.0–3.2)
Triglycerides: 115 mg/dL (ref 0–149)
VLDL Cholesterol Cal: 20 mg/dL (ref 5–40)

## 2021-08-12 LAB — CMP14+EGFR
ALT: 7 IU/L (ref 0–32)
AST: 15 IU/L (ref 0–40)
Albumin/Globulin Ratio: 1.7 (ref 1.2–2.2)
Albumin: 4.1 g/dL (ref 3.7–4.7)
Alkaline Phosphatase: 53 IU/L (ref 44–121)
BUN/Creatinine Ratio: 9 — ABNORMAL LOW (ref 12–28)
BUN: 13 mg/dL (ref 8–27)
Bilirubin Total: 0.3 mg/dL (ref 0.0–1.2)
CO2: 19 mmol/L — ABNORMAL LOW (ref 20–29)
Calcium: 9.3 mg/dL (ref 8.7–10.3)
Chloride: 104 mmol/L (ref 96–106)
Creatinine, Ser: 1.44 mg/dL — ABNORMAL HIGH (ref 0.57–1.00)
Globulin, Total: 2.4 g/dL (ref 1.5–4.5)
Glucose: 118 mg/dL — ABNORMAL HIGH (ref 70–99)
Potassium: 4.2 mmol/L (ref 3.5–5.2)
Sodium: 140 mmol/L (ref 134–144)
Total Protein: 6.5 g/dL (ref 6.0–8.5)
eGFR: 38 mL/min/{1.73_m2} — ABNORMAL LOW (ref >59)

## 2021-08-12 LAB — VITAMIN D 25 HYDROXY (VIT D DEFICIENCY, FRACTURES): Vit D, 25-Hydroxy: 50.6 ng/mL (ref 30.0–100.0)

## 2021-08-12 LAB — HEPATITIS C ANTIBODY: Hep C Virus Ab: 0.1 s/co ratio (ref 0.0–0.9)

## 2021-08-12 NOTE — Assessment & Plan Note (Signed)
Probably aggravated due to her griefing state. Continue doxepin. Takes multiple sedating agents and antidepressant medications.

## 2021-08-14 NOTE — Progress Notes (Signed)
You saw her in-office on 12/16. Kidney function is a little lower.

## 2021-08-15 ENCOUNTER — Other Ambulatory Visit: Payer: Self-pay

## 2021-08-15 ENCOUNTER — Other Ambulatory Visit: Payer: Self-pay | Admitting: Nurse Practitioner

## 2021-08-15 MED ORDER — POTASSIUM CHLORIDE CRYS ER 20 MEQ PO TBCR: 20.0000 meq | EXTENDED_RELEASE_TABLET | Freq: Two times a day (BID) | ORAL | 3 refills | Status: DC

## 2021-08-24 ENCOUNTER — Other Ambulatory Visit: Payer: Self-pay | Admitting: Nurse Practitioner

## 2021-09-05 ENCOUNTER — Encounter: Payer: Self-pay | Admitting: Nurse Practitioner

## 2021-09-05 ENCOUNTER — Other Ambulatory Visit: Payer: Self-pay

## 2021-09-05 ENCOUNTER — Other Ambulatory Visit: Payer: Self-pay | Admitting: *Deleted

## 2021-09-05 ENCOUNTER — Ambulatory Visit (INDEPENDENT_AMBULATORY_CARE_PROVIDER_SITE_OTHER): Payer: HMO | Admitting: Nurse Practitioner

## 2021-09-05 DIAGNOSIS — F321 Major depressive disorder, single episode, moderate: Secondary | ICD-10-CM

## 2021-09-05 DIAGNOSIS — F4321 Adjustment disorder with depressed mood: Secondary | ICD-10-CM | POA: Diagnosis not present

## 2021-09-05 DIAGNOSIS — F419 Anxiety disorder, unspecified: Secondary | ICD-10-CM

## 2021-09-05 MED ORDER — ALENDRONATE SODIUM 70 MG PO TABS: 70.0000 mg | ORAL_TABLET | ORAL | 0 refills | Status: DC

## 2021-09-05 MED ORDER — LISINOPRIL 2.5 MG PO TABS: ORAL_TABLET | ORAL | 0 refills | Status: DC

## 2021-09-05 MED ORDER — BUSPIRONE HCL 7.5 MG PO TABS: ORAL_TABLET | ORAL | 0 refills | Status: DC

## 2021-09-05 MED ORDER — ALENDRONATE SODIUM 70 MG PO TABS: 70.0000 mg | ORAL_TABLET | ORAL | 11 refills | Status: DC

## 2021-09-05 MED ORDER — VITAMIN D (ERGOCALCIFEROL) 1.25 MG (50000 UNIT) PO CAPS: ORAL_CAPSULE | ORAL | 0 refills | Status: DC

## 2021-09-05 MED ORDER — HYDROXYCHLOROQUINE SULFATE 200 MG PO TABS: 200.0000 mg | ORAL_TABLET | Freq: Every day | ORAL | 0 refills | Status: DC

## 2021-09-05 MED ORDER — SIMVASTATIN 40 MG PO TABS: ORAL_TABLET | ORAL | 2 refills | Status: DC

## 2021-09-05 NOTE — Assessment & Plan Note (Signed)
PHQ9 score today is 18. Pt denies SI, HI. Pt told to go the ER if she ever have thoughts of harming herself or anyone , she verbalized understanding. Continue current medications , referral made to psych for counseling.

## 2021-09-05 NOTE — Assessment & Plan Note (Signed)
Pt would like to speak with a Counsellor , she has been having a hard timie coping since she lost her son in August 2022.

## 2021-09-05 NOTE — Progress Notes (Signed)
Virtual Visit via Telephone Note  I connected with [name] on 09/05/21 at 3:26 by telephone and verified that I am speaking with the correct person using two identifiers. I spent 9 minutes talking to the pt and reviewing her chart.   Location: Patient: home Provider: office   I discussed the limitations, risks, security and privacy concerns of performing an evaluation and management service by telephone and the availability of in person appointments. I also discussed with the patient that there may be a patient responsible charge related to this service. The patient expressed understanding and agreed to proceed.   History of Present Illness: Pt stated that she lost her son on Auust 8th, 2022, she has has been having a hard time dealing with her loss. Would like to talk to a grief Counsellor. She was not able to talk to the Counsellor thel last time she had an appointment with them because  she was preparing for her son's funeral at that time. Denis SI, HI. She has been taking her  depression medications.    Observations/Objective:   Assessment and Plan: Depression. PHQ9 score today is 18. Pt denies SI, HI. Pt told to go the ER if she ever have thoughts of harming herself or anyone , she verbalized understanding. Continue current medications , referral made to psych for counseling.   Follow Up Instructions:    I discussed the assessment and treatment plan with the patient. The patient was provided an opportunity to ask questions and all were answered. The patient agreed with the plan and demonstrated an understanding of the instructions.   The patient was advised to call back or seek an in-person evaluation if the symptoms worsen or if the condition fails to improve as anticipated.

## 2021-09-22 ENCOUNTER — Other Ambulatory Visit: Payer: Self-pay | Admitting: Nurse Practitioner

## 2021-09-25 ENCOUNTER — Other Ambulatory Visit: Payer: Self-pay

## 2021-09-25 MED ORDER — FUROSEMIDE 40 MG PO TABS: 40.0000 mg | ORAL_TABLET | Freq: Two times a day (BID) | ORAL | 0 refills | Status: DC

## 2021-09-25 MED ORDER — SODIUM BICARBONATE 650 MG PO TABS: ORAL_TABLET | ORAL | 0 refills | Status: DC

## 2021-09-26 ENCOUNTER — Other Ambulatory Visit: Payer: Self-pay

## 2021-09-26 ENCOUNTER — Ambulatory Visit (INDEPENDENT_AMBULATORY_CARE_PROVIDER_SITE_OTHER): Payer: HMO

## 2021-09-26 ENCOUNTER — Telehealth: Payer: Self-pay

## 2021-09-26 DIAGNOSIS — Z0000 Encounter for general adult medical examination without abnormal findings: Secondary | ICD-10-CM

## 2021-09-26 DIAGNOSIS — Z780 Asymptomatic menopausal state: Secondary | ICD-10-CM

## 2021-09-26 DIAGNOSIS — Z1211 Encounter for screening for malignant neoplasm of colon: Secondary | ICD-10-CM

## 2021-09-26 DIAGNOSIS — Z0001 Encounter for general adult medical examination with abnormal findings: Secondary | ICD-10-CM | POA: Diagnosis not present

## 2021-09-26 DIAGNOSIS — F321 Major depressive disorder, single episode, moderate: Secondary | ICD-10-CM

## 2021-09-26 NOTE — Patient Instructions (Signed)

## 2021-09-26 NOTE — Progress Notes (Signed)
Subjective:   [name] is a 76 y.o. female who presents for Medicare Annual (Subsequent) preventive examination. I connected with  Toniann Fail Glatfelter on 09/26/21 by a audio enabled telemedicine application and verified that I am speaking with the correct person using two identifiers.  Patient Location: Home  Provider Location: Office/Clinic  I discussed the limitations of evaluation and management by telemedicine. The patient expressed understanding and agreed to proceed.  Review of Systems    Defer to PCP Cardiac Risk Factors include: advanced age (>76men, >46 women);diabetes mellitus;hypertension;sedentary lifestyle     Objective:    Today's Vitals   09/26/21 1619  PainSc: 0-No pain   There is no height or weight on file to calculate BMI.  Advanced Directives 09/26/2021 05/06/2021 11/19/2020 01/18/2017 03/02/2014  Does Patient Have a Medical Advance Directive? No No No No Patient does not have advance directive  Would patient like information on creating a medical advance directive? No - Patient declined No - Patient declined No - Patient declined No - Patient declined -    Current Medications (verified) Outpatient Encounter Medications as of 09/26/2021  Medication Sig   albuterol (VENTOLIN HFA) 108 (90 Base) MCG/ACT inhaler Inhale 1-2 puffs into the lungs every 6 (six) hours as needed for wheezing or shortness of breath.   alendronate (FOSAMAX) 70 MG tablet Take 1 tablet (70 mg total) by mouth once a week. Take with a full glass of water on an empty stomach.   busPIRone (BUSPAR) 7.5 MG tablet TAKE (1) TABLET BY MOUTH TWICE DAILY.   doxepin (SINEQUAN) 10 MG/ML solution Take 10 mg by mouth in the morning and at bedtime. Patient takes one dose (45ml) up to twice daily   DULoxetine (CYMBALTA) 30 MG capsule Take 30 mg by mouth 2 (two) times daily.   furosemide (LASIX) 40 MG tablet Take 1 tablet (40 mg total) by mouth 2 (two) times daily.   glipiZIDE (GLUCOTROL XL) 5 MG 24 hr tablet Take  5 mg by mouth daily with breakfast.   hydroxychloroquine (PLAQUENIL) 200 MG tablet Take 1 tablet (200 mg total) by mouth daily.   lisinopril (ZESTRIL) 2.5 MG tablet TAKE ONE TABLET BY MOUTH ONCE DAILY FOR BLOOD PRESSURE.   methocarbamol (ROBAXIN) 500 MG tablet Take 500 mg by mouth 4 (four) times daily.   oxybutynin (DITROPAN) 5 MG tablet TAKE (1) TABLET BY MOUTH (3) TIMES DAILY.   oxyCODONE-acetaminophen (PERCOCET) 10-325 MG tablet Take 1 tablet by mouth every 6 (six) hours as needed for pain.   pantoprazole (PROTONIX) 40 MG tablet Take 40 mg by mouth 2 (two) times daily.   potassium chloride SA (KLOR-CON M) 20 MEQ tablet Take 1 tablet (20 mEq total) by mouth 2 (two) times daily.   pregabalin (LYRICA) 100 MG capsule Take 100-200 mg by mouth See admin instructions. 100mg  taken in the morning and 200mg  at bedtime   sertraline (ZOLOFT) 100 MG tablet TAKE 1 1/2 TABLETS (150MG ) BY MOUTH DAILY   simvastatin (ZOCOR) 40 MG tablet TAKE (1) TABLET BY MOUTH AT BEDTIME.   sodium bicarbonate 650 MG tablet TAKE (1) TABLET BY MOUTH TWICE DAILY.   Vitamin D, Ergocalciferol, (DRISDOL) 1.25 MG (50000 UNIT) CAPS capsule TAKE 1 CAPSULE THE SAME DAY EACH WEEK.   No facility-administered encounter medications on file as of 09/26/2021.    Allergies (verified) Darvon [propoxyphene], Morphine and related, and Penicillins   History: Past Medical History:  Diagnosis Date   Arthritis    Coronary artery disease  Diabetes mellitus without complication (HCC)    Encephalopathy 03/02/2014   Hyperlipidemia    Hypertension    Osteoporosis    Referred otalgia of right ear 02/11/2017   Renal disorder    Sarcoidosis    Throat pain 03/03/2020   Past Surgical History:  Procedure Laterality Date   ANKLE SURGERY     BACK SURGERY     FRACTURE SURGERY     tibia    Family History  Problem Relation Age of Onset   Hypertension Mother    Hyperlipidemia Mother    Throat cancer Father    Early death Son    Social History    Socioeconomic History   Marital status: Married    Spouse name: Ronnie    Number of children: 3   Years of education: 12   Highest education level: Master's degree (e.g., MA, MS, MEng, MEd, MSW, MBA)  Occupational History   Not on file  Tobacco Use   Smoking status: Never   Smokeless tobacco: Never  Vaping Use   Vaping Use: Never used  Substance and Sexual Activity   Alcohol use: No   Drug use: No   Sexual activity: Not Currently  Other Topics Concern   Not on file  Social History Narrative   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)   Lives with husband-Ronnie    Has 3 grown children: two live in town and one out of town   10 grandbabies   8 great grandbabies   Enjoys: reading, talk with friends, church-sing in choir       Diet: eats all food groups   Caffeine: none     Water: 4 cups daily      Wears seat belt   Does not use phone while driving   Psychologist, sport and exercise at home   Social Determinants of Corporate investment banker Strain: Low Risk    Difficulty of Paying Living Expenses: Not hard at all  Food Insecurity: No Food Insecurity   Worried About Programme researcher, broadcasting/film/video in the Last Year: Never true   Barista in the Last Year: Never true  Transportation Needs: No Transportation Needs   Lack of Transportation (Medical): No   Lack of Transportation (Non-Medical): No  Physical Activity: Inactive   Days of Exercise per Week: 0 days   Minutes of Exercise per Session: 0 min  Stress: Stress Concern Present   Feeling of Stress : Very much  Social Connections: Moderately Isolated   Frequency of Communication with Friends and Family: More than three times a week   Frequency of Social Gatherings with Friends and Family: Once a week   Attends Religious Services: Never   Database administrator or Organizations: No   Attends Engineer, structural: Never   Marital Status: Married    Tobacco Counseling Counseling given: Not Answered   Clinical  Intake:  Pre-visit preparation completed: No  Pain : No/denies pain Pain Score: 0-No pain     Nutritional Risks: None Diabetes: Yes Did pt. bring in CBG monitor from home?: No  How often do you need to have someone help you when you read instructions, pamphlets, or other written materials from your doctor or pharmacy?: 1 - Never What is the last grade level you completed in school?: 12  Diabetic?Nutrition Risk Assessment:  Has the patient had any N/V/D within the last 2 months?  No  Does the patient have any non-healing wounds?  No  Has the patient  had any unintentional weight loss or weight gain?  No   Diabetes:  Is the patient diabetic?  Yes  If diabetic, was a CBG obtained today?  No  Did the patient bring in their glucometer from home?  No  How often do you monitor your CBG's? Once daily.   Financial Strains and Diabetes Management:  Are you having any financial strains with the device, your supplies or your medication? No .  Does the patient want to be seen by Chronic Care Management for management of their diabetes?  No  Would the patient like to be referred to a Nutritionist or for Diabetic Management?  No   Diabetic Exams:  Diabetic Eye Exam: Completed 02/21/2021 Diabetic Foot Exam: Completed 08/11/2021    Interpreter Needed?: No  Information entered by :: Cordelia PenSherry   Activities of Daily Living In your present state of health, do you have any difficulty performing the following activities: 09/26/2021  Hearing? N  Vision? N  Walking or climbing stairs? N  Dressing or bathing? N  Preparing Food and eating ? N  Using the Toilet? N  In the past six months, have you accidently leaked urine? N  Do you have problems with loss of bowel control? N  Managing your Medications? N  Managing your Finances? N  Housekeeping or managing your Housekeeping? N  Some recent data might be hidden    Patient Care Team: Donell BeersPaseda, Folashade R, FNP as PCP - General (Nurse  Practitioner)  Indicate any recent Medical Services you may have received from other than [facility] providers in the past year (date may be approximate).     Assessment:   This is a routine wellness examination for Randa EvensJoanne.  Hearing/Vision screen No results found.  Dietary issues and exercise activities discussed: Current Exercise Habits: The patient does not participate in regular exercise at present, Exercise limited by: cardiac condition(s);orthopedic condition(s);psychological condition(s)   Goals Addressed             This Visit's Progress    Increase physical activity   On track    Try to go for short walks as tolerated       Depression Screen PHQ 2/9 Scores 09/26/2021 09/05/2021 09/05/2021 08/11/2021 04/04/2021 01/25/2021 12/08/2020  PHQ - 2 Score 6 6 0 6 0 0 0  PHQ- 9 Score 12 18 0 10 - 4 2    Fall Risk Fall Risk  09/26/2021 09/05/2021 08/11/2021 04/04/2021 01/25/2021  Falls in the past year? 0 0 0 0 0  Number falls in past yr: - 0 0 0 0  Injury with Fall? 0 0 0 0 0  Risk for fall due to : Impaired balance/gait;Orthopedic patient Impaired balance/gait;Impaired mobility No Fall Risks No Fall Risks No Fall Risks  Follow up Falls evaluation completed Falls evaluation completed;Education provided;Falls prevention discussed Falls evaluation completed;Education provided;Falls prevention discussed Falls evaluation completed Falls evaluation completed    FALL RISK PREVENTION PERTAINING TO THE HOME:  Any stairs in or around the home? Yes  If so, are there any without handrails? Yes  Home free of loose throw rugs in walkways, pet beds, electrical cords, etc? Yes  Adequate lighting in your home to reduce risk of falls? Yes   ASSISTIVE DEVICES UTILIZED TO PREVENT FALLS:  Life alert? No  Use of a cane, walker or w/c? No  Grab bars in the bathroom? No  Shower chair or bench in shower? Yes  Elevated toilet seat or a handicapped toilet? Yes  Cognitive Function:     6CIT Screen  09/26/2021 09/14/2020  What Year? 0 points 0 points  What month? 0 points 0 points  What time? 0 points 0 points  Count back from 20 2 points 0 points  Months in reverse 0 points 2 points  Repeat phrase 0 points 0 points  Total Score 2 2    Immunizations Immunization History  Administered Date(s) Administered   Fluad Quad(high Dose 65+) 07/27/2020, 08/11/2021   Influenza-Unspecified 08/02/2016, 06/26/2017, 07/03/2018   Moderna Sars-Covid-2 Vaccination 10/27/2019, 11/24/2019   Pneumococcal Conjugate-13 12/29/2013   Pneumococcal Polysaccharide-23 08/02/2016   Zoster Recombinat (Shingrix) 07/03/2018   Zoster, Live 06/15/2011    TDAP status: Due, Education has been provided regarding the importance of this vaccine. Advised may receive this vaccine at local pharmacy or Health Dept. Aware to provide a copy of the vaccination record if obtained from local pharmacy or Health Dept. Verbalized acceptance and understanding.  Flu Vaccine status: Up to date  Pneumococcal vaccine status: Up to date  Covid-19 vaccine status: Information provided on how to obtain vaccines.   Qualifies for Shingles Vaccine? Yes   Zostavax completed No   Shingrix Completed?: No.    Education has been provided regarding the importance of this vaccine. Patient has been advised to call insurance company to determine out of pocket expense if they have not yet received this vaccine. Advised may also receive vaccine at local pharmacy or Health Dept. Verbalized acceptance and understanding.  Screening Tests Health Maintenance  Topic Date Due   TETANUS/TDAP  Never done   Fecal DNA (Cologuard)  Never done   Zoster Vaccines- Shingrix (2 of 2) 08/28/2018   COVID-19 Vaccine (3 - Booster for Moderna series) 01/19/2020   HEMOGLOBIN A1C  02/09/2022   OPHTHALMOLOGY EXAM  02/21/2022   FOOT EXAM  08/11/2022   MAMMOGRAM  07/06/2023   Pneumonia Vaccine 465+ Years old  Completed   INFLUENZA VACCINE  Completed   DEXA SCAN   Completed   Hepatitis C Screening  Completed   HPV VACCINES  Aged Out    Health Maintenance  Health Maintenance Due  Topic Date Due   TETANUS/TDAP  Never done   Fecal DNA (Cologuard)  Never done   Zoster Vaccines- Shingrix (2 of 2) 08/28/2018   COVID-19 Vaccine (3 - Booster for Moderna series) 01/19/2020    Colorectal cancer screening: Type of screening: Cologuard. Completed Ordered 09/26/2021. Repeat every ? years  Mammogram status: Completed 07/05/2021. Repeat every year  Bone Density status: Completed 07/16/2018. Results reflect: Bone density results: OSTEOPOROSIS. Repeat every 2 years.  Lung Cancer Screening: (Low Dose CT Chest recommended if Age 81-80 years, 30 pack-year currently smoking OR have quit w/in 15years.) does not qualify.   Lung Cancer Screening Referral: n/a  Additional Screening:  Hepatitis C Screening: does qualify; Completed 08/11/2021   Vision Screening: Recommended annual ophthalmology exams for early detection of glaucoma and other disorders of the eye. Is the patient up to date with their annual eye exam?  Yes  Who is the provider or what is the name of the office in which the patient attends annual eye exams? [facility] If pt is not established with a provider, would they like to be referred to a provider to establish care? No .   Dental Screening: Recommended annual dental exams for proper oral hygiene  Community Resource Referral / Chronic Care Management: CRR required this visit?  No   CCM required this visit?  No  Plan:     I have personally reviewed and noted the following in the patients chart:   Medical and social history Use of alcohol, tobacco or illicit drugs  Current medications and supplements including opioid prescriptions.  Functional ability and status Nutritional status Physical activity Advanced directives List of other physicians Hospitalizations, surgeries, and ER visits in previous 12  months Vitals Screenings to include cognitive, depression, and falls Referrals and appointments  In addition, I have reviewed and discussed with patient certain preventive protocols, quality metrics, and best practice recommendations. A written personalized care plan for preventive services as well as general preventive health recommendations were provided to patient.     Glendale Chard, CMA   09/26/2021   Nurse Notes:  Ms. Bonnet , Thank you for taking time to come for your Medicare Wellness Visit. I appreciate your ongoing commitment to your health goals. Please review the following plan we discussed and let me know if I can assist you in the future.   These are the goals we discussed:  Goals      Increase physical activity     Try to go for short walks as tolerated         This is a list of the screening recommended for you and due dates:  Health Maintenance  Topic Date Due   Tetanus Vaccine  Never done   Cologuard (Stool DNA test)  Never done   Zoster (Shingles) Vaccine (2 of 2) 08/28/2018   COVID-19 Vaccine (3 - Booster for Moderna series) 01/19/2020   Hemoglobin A1C  02/09/2022   Eye exam for diabetics  02/21/2022   Complete foot exam   08/11/2022   Mammogram  07/06/2023   Pneumonia Vaccine  Completed   Flu Shot  Completed   DEXA scan (bone density measurement)  Completed   Hepatitis C Screening: USPSTF Recommendation to screen - Ages 38-74 yo.  Completed   HPV Vaccine  Aged Out

## 2021-09-26 NOTE — Telephone Encounter (Signed)
Pt scored high on the PHQ-9 and she also stated that she was supposed to be seeing therapist and no one has contacted her.  Can you advise her what to do

## 2021-09-27 ENCOUNTER — Telehealth: Payer: Self-pay | Admitting: *Deleted

## 2021-09-27 ENCOUNTER — Encounter: Payer: Self-pay | Admitting: Nurse Practitioner

## 2021-09-27 NOTE — Chronic Care Management (AMB) (Signed)
Chronic Care Management   Note  09/27/2021 Name: [name] MRN: [id number] DOB: 30-Jul-1946  [name] is a 76 y.o. year old female who is a primary care patient of Renee Rival, FNP. I reached out to [employer] by phone today in response to a referral sent by [name].  [name] was given information about Chronic Care Management services today including:  CCM service includes personalized support from designated clinical staff supervised by her physician, including individualized plan of care and coordination with other care providers 24/7 contact phone numbers for assistance for urgent and routine care needs. Service will only be billed when office clinical staff spend 20 minutes or more in a month to coordinate care. Only one practitioner may furnish and bill the service in a calendar month. The patient may stop CCM services at any time (effective at the end of the month) by phone call to the office staff. The patient is responsible for co-pay (up to 20% after annual deductible is met) if co-pay is required by the individual health plan.   Patient agreed to services and verbal consent obtained.   Follow up plan: Face to Face appointment with care management team member scheduled for: 11/10/21  [facility]  Direct Dial: [phone]

## 2021-09-28 ENCOUNTER — Ambulatory Visit (HOSPITAL_COMMUNITY): Payer: Self-pay | Admitting: Psychiatry

## 2021-10-09 ENCOUNTER — Other Ambulatory Visit: Payer: Self-pay | Admitting: Nurse Practitioner

## 2021-10-09 DIAGNOSIS — F419 Anxiety disorder, unspecified: Secondary | ICD-10-CM

## 2021-10-26 ENCOUNTER — Other Ambulatory Visit: Payer: Self-pay | Admitting: Nurse Practitioner

## 2021-10-26 MED ORDER — POTASSIUM CHLORIDE CRYS ER 20 MEQ PO TBCR: 20.0000 meq | EXTENDED_RELEASE_TABLET | Freq: Every day | ORAL | 3 refills | Status: DC

## 2021-10-26 MED ORDER — SODIUM BICARBONATE 650 MG PO TABS: ORAL_TABLET | ORAL | 0 refills | Status: DC

## 2021-10-26 MED ORDER — FUROSEMIDE 40 MG PO TABS: 40.0000 mg | ORAL_TABLET | Freq: Every day | ORAL | 0 refills | Status: DC

## 2021-10-26 NOTE — Telephone Encounter (Signed)
Spoke with pt gave instructions she verbalized understanding  ?

## 2021-10-31 DIAGNOSIS — G894 Chronic pain syndrome: Secondary | ICD-10-CM | POA: Diagnosis not present

## 2021-10-31 DIAGNOSIS — M5459 Other low back pain: Secondary | ICD-10-CM | POA: Diagnosis not present

## 2021-10-31 DIAGNOSIS — M1711 Unilateral primary osteoarthritis, right knee: Secondary | ICD-10-CM | POA: Diagnosis not present

## 2021-10-31 DIAGNOSIS — M130 Polyarthritis, unspecified: Secondary | ICD-10-CM | POA: Diagnosis not present

## 2021-10-31 DIAGNOSIS — G521 Disorders of glossopharyngeal nerve: Secondary | ICD-10-CM | POA: Diagnosis not present

## 2021-10-31 DIAGNOSIS — Z79891 Long term (current) use of opiate analgesic: Secondary | ICD-10-CM | POA: Diagnosis not present

## 2021-10-31 DIAGNOSIS — E1142 Type 2 diabetes mellitus with diabetic polyneuropathy: Secondary | ICD-10-CM | POA: Diagnosis not present

## 2021-10-31 DIAGNOSIS — G4700 Insomnia, unspecified: Secondary | ICD-10-CM | POA: Diagnosis not present

## 2021-10-31 DIAGNOSIS — M5416 Radiculopathy, lumbar region: Secondary | ICD-10-CM | POA: Diagnosis not present

## 2021-10-31 DIAGNOSIS — R002 Palpitations: Secondary | ICD-10-CM | POA: Diagnosis not present

## 2021-11-03 ENCOUNTER — Other Ambulatory Visit: Payer: Self-pay | Admitting: Nurse Practitioner

## 2021-11-06 ENCOUNTER — Other Ambulatory Visit: Payer: Self-pay | Admitting: Nurse Practitioner

## 2021-11-08 ENCOUNTER — Other Ambulatory Visit: Payer: Self-pay

## 2021-11-08 ENCOUNTER — Telehealth: Payer: Self-pay | Admitting: Nurse Practitioner

## 2021-11-08 DIAGNOSIS — F419 Anxiety disorder, unspecified: Secondary | ICD-10-CM

## 2021-11-08 MED ORDER — BUSPIRONE HCL 7.5 MG PO TABS: ORAL_TABLET | ORAL | 0 refills | Status: DC

## 2021-11-08 NOTE — Telephone Encounter (Signed)
Patient called in office wants to reschedule upcoming appt. On 3/17 ? ?Patient would like a call back tomorrow 3/16 to reschedule.  ?

## 2021-11-10 ENCOUNTER — Ambulatory Visit (INDEPENDENT_AMBULATORY_CARE_PROVIDER_SITE_OTHER): Payer: HMO | Admitting: Licensed Clinical Social Worker

## 2021-11-10 ENCOUNTER — Other Ambulatory Visit: Payer: Self-pay | Admitting: Internal Medicine

## 2021-11-10 DIAGNOSIS — E119 Type 2 diabetes mellitus without complications: Secondary | ICD-10-CM

## 2021-11-10 DIAGNOSIS — K219 Gastro-esophageal reflux disease without esophagitis: Secondary | ICD-10-CM

## 2021-11-10 DIAGNOSIS — G4709 Other insomnia: Secondary | ICD-10-CM

## 2021-11-10 DIAGNOSIS — F321 Major depressive disorder, single episode, moderate: Secondary | ICD-10-CM

## 2021-11-10 DIAGNOSIS — F4321 Adjustment disorder with depressed mood: Secondary | ICD-10-CM

## 2021-11-10 NOTE — Chronic Care Management (AMB) (Signed)
?Chronic Care Management  ? ? Clinical Social Work Note ? ?11/10/2021 ?Name: [name] MRN: [id number] DOB: 09-16-1945 ? ?[name] is a 76 y.o. year old female who is a primary care patient of Renee Rival, FNP. The CCM team was consulted to assist the patient with chronic disease management and/or care coordination needs related to: [employer] .  ? ?Engaged with patient by telephone for initial visit in response to provider referral for social work chronic care management and care coordination services.  ? ?Consent to Services:  ?The patient was given the following information about Chronic Care Management services today, agreed to services, and gave verbal consent: 1. CCM service includes personalized support from designated clinical staff supervised by the primary care provider, including individualized plan of care and coordination with other care providers 2. 24/7 contact phone numbers for assistance for urgent and routine care needs. 3. Service will only be billed when office clinical staff spend 20 minutes or more in a month to coordinate care. 4. Only one practitioner may furnish and bill the service in a calendar month. 5.The patient may stop CCM services at any time (effective at the end of the month) by phone call to the office staff. 6. The patient will be responsible for cost sharing (co-pay) of up to 20% of the service fee (after annual deductible is met). Patient agreed to services and consent obtained. ? ?Patient agreed to services and consent obtained.  ? ?Assessment: Review of patient past medical history, allergies, medications, and health status, including review of relevant consultants reports was performed today as part of a comprehensive evaluation and provision of chronic care management and care coordination services.    ? ?SDOH (Social Determinants of Health) assessments and interventions performed:  ?SDOH Interventions   ? ?Flowsheet Row Most Recent Value  ?SDOH  Interventions   ?Physical Activity Interventions Other (Comments)  [takes periodic rest breaks when walking]  ?Stress Interventions Provide Counseling  [client has stress related to grief issues faced]  ?Depression Interventions/Treatment  Medication, Counseling  ? ?  ?  ? ?Advanced Directives Status: See Vyn[employer] application for related entries. ? ?CCM Care Plan ? ?Allergies  ?Allergen Reactions  ? Darvon [Propoxyphene] Nausea And Vomiting  ? Morphine And Related Itching and Rash  ? Penicillins Itching and Rash  ?  Pt denies this allergy  ? ? ?Outpatient Encounter Medications as of 11/10/2021  ?Medication Sig  ? albuterol (VENTOLIN HFA) 108 (90 Base) MCG/ACT inhaler Inhale 1-2 puffs into the lungs every 6 (six) hours as needed for wheezing or shortness of breath.  ? alendronate (FOSAMAX) 70 MG tablet Take 1 tablet (70 mg total) by mouth once a week. Take with a full glass of water on an empty stomach.  ? busPIRone (BUSPAR) 7.5 MG tablet TAKE (1) TABLET BY MOUTH TWICE DAILY.  ? doxepin (SINEQUAN) 10 MG/ML solution Take 10 mg by mouth in the morning and at bedtime. Patient takes one dose (52m) up to twice daily  ? DULoxetine (CYMBALTA) 30 MG capsule Take 30 mg by mouth 2 (two) times daily.  ? furosemide (LASIX) 40 MG tablet Take 1 tablet (40 mg total) by mouth daily.  ? glipiZIDE (GLUCOTROL XL) 5 MG 24 hr tablet Take 5 mg by mouth daily with breakfast.  ? hydroxychloroquine (PLAQUENIL) 200 MG tablet TAKE ONE TABLET BY MOUTH ONCE DAILY.  ? lisinopril (ZESTRIL) 2.5 MG tablet TAKE ONE TABLET BY MOUTH ONCE DAILY FOR BLOOD PRESSURE.  ? methocarbamol (ROBAXIN)  500 MG tablet Take 500 mg by mouth 4 (four) times daily.  ? oxybutynin (DITROPAN) 5 MG tablet TAKE (1) TABLET BY MOUTH (3) TIMES DAILY.  ? oxyCODONE-acetaminophen (PERCOCET) 10-325 MG tablet Take 1 tablet by mouth every 6 (six) hours as needed for pain.  ? pantoprazole (PROTONIX) 40 MG tablet Take 40 mg by mouth 2 (two) times daily.  ? potassium chloride SA (KLOR-CON M)  20 MEQ tablet Take 1 tablet (20 mEq total) by mouth daily.  ? pregabalin (LYRICA) 100 MG capsule Take 100-200 mg by mouth See admin instructions. 131m taken in the morning and 2052mat bedtime  ? sertraline (ZOLOFT) 100 MG tablet TAKE 1 1/2 TABLETS (150MG) BY MOUTH DAILY  ? simvastatin (ZOCOR) 40 MG tablet TAKE (1) TABLET BY MOUTH AT BEDTIME.  ? sodium bicarbonate 650 MG tablet TAKE (1) TABLET BY MOUTH TWICE DAILY.  ? Vitamin D, Ergocalciferol, (DRISDOL) 1.25 MG (50000 UNIT) CAPS capsule TAKE 1 CAPSULE THE SAME DAY EACH WEEK.  ? ?No facility-administered encounter medications on file as of 11/10/2021.  ? ? ?Patient Active Problem List  ? Diagnosis Date Noted  ? Grief 09/05/2021  ? Insomnia 08/12/2021  ? Need for immunization against influenza 08/11/2021  ? Overactive bladder 10/11/2020  ? Anxiety 09/13/2020  ? Tongue pain 04/22/2020  ? Gastroesophageal reflux disease 03/03/2020  ? Laryngopharyngeal reflux (LPR) 03/03/2020  ? Depression, major, single episode, moderate ([facility]04/02/2020  ? Diabetes mellitus without complication (HCDenton0412/75/1700? Hyperlipidemia 12/02/2019  ? Essential hypertension 12/02/2019  ? Lumbar radiculopathy 10/31/2019  ? Polyarthropathy 10/31/2019  ? Polyneuropathy due to type 2 diabetes mellitus (HCMcColl03/01/2020  ? Chronic pain syndrome 10/31/2019  ? Bilateral sensorineural hearing loss 02/11/2017  ? Right ear impacted cerumen 02/11/2017  ? Sarcoidosis 03/02/2014  ? Low back pain 03/02/2014  ? ? ?Conditions to be addressed/monitored: monitor client management of grief issues faced ? ?Care Plan : LCSW care plan  ?Updates made by FoKatha CabalLCSW since 11/10/2021 12:00 AM  ?  ? ?Problem: Emotional Distress   ?  ? ?Goal: Emotional Health Supported.  Manage Grief issues faced   ?Start Date: 11/10/2021  ?Expected End Date: 02/08/2022  ?This Visit's Progress: Not on track  ?Priority: Medium  ?Note:   ?Current Barriers:  ?Grief issues ?Stress issues ?Depression issues ?Suicidal  Ideation/Homicidal Ideation: No ? ?Clinical Social Work Goal(s):  ?patient will work with SW monthly by telephone or in person to reduce or manage symptoms related to Grief ?Patient will work with SW monthly to discuss depression issues of client ?Client to call RNCM as needed in next 30 days to discuss nursing needs of client ? ?Interventions: ?Patient interviewed and appropriate assessments performed: GAD-7 and PHQ 2/9 ?1:1 collaboration with PaRenee RivalFNP regarding development and update of comprehensive plan of care as evidenced by provider attestation and co-signature ?Discussed client needs with JoPierce CraneDiscussed grief issues faced by JoMechele ClaudeTalked with JoBraylinnbout family support.. she has support from her spouse and from her son.  She has friends she talks with about grief issues. [facility]. Gave client phone number for [facility]  ?Discussed sleeping issues of client and appetite issues of client ?Discussed pain issues of client. She sees Dr. DoMerlene Laughterneurologist, related to pain issues ?Discussed mood issues of client. She discussed sadness related to grief issues over death of her  son in August of 2022 ?Client discussed her difficulty in sitting very long due to back pain issues ?Encouraged client to use self care techniques and to engage in activities to relieve stress ?Encouraged client to call RNCM as needed for nursing support ? ?Patient Self Care Activities:  ?Attends medical appointments ?Takes medications as prescribed ? ?Patient Coping Strengths:  ?Has support from her spouse and from her son ?Has transport help as needed ?Has support from her friends ? ?Patient Self Care Deficits:  ?Experiencing Grief issues ?Reduced appetite ?Reduced sleep ? ?Patient Goals:  ?- spend time or talk with others at least 2 to 3 times per week ?- practice  relaxation or meditation daily ?- keep a calendar with appointment dates ? ?Follow Up Plan: LCSW to call client on 12/29/21 at 3:00 PM   ? ?Norva Riffle.Pravin Perezperez MSW, LCSW ?Licensed Clinical Social Worker ?[facility]

## 2021-11-10 NOTE — Patient Instructions (Addendum)
Visit Information ? ?Patient Goals:  Depressive Symptoms Identified; Manage Depression symptoms. Manage Grief issues ? ?Time Frame:  Short Term Goal ?Priority:  Medium ?Progress:  On Track ? ?Start Date:  11/10/21 ?End Date:  02/08/22 ? ?Follow up Date:  12/29/21 at 3:00 PM ? ?Depressive Symptoms Identified. Manage Depression symptoms. Manage Grief issues ? ?Patient Self Care Activities:  ?Attends medical appointments ?Takes medications as prescribed ? ?Patient Coping Strengths:  ?Has support from her spouse and from her son ?Has transport help as needed ?Has support from her friends ? ?Patient Self Care Deficits:  ?Experiencing Grief issues ?Reduced appetite ?Reduced sleep ? ?Patient Goals:  ?- spend time or talk with others at least 2 to 3 times per week ?- practice relaxation or meditation daily ?- keep a calendar with appointment dates ? ?Follow Up Plan: LCSW to call client on 12/29/21 at 3:00 PM  ? ?Kelton Pillar.Ahna Konkle MSW, LCSW ?Licensed Clinical Social Worker ?[facility] Care Management ?[phone] ?

## 2021-11-14 ENCOUNTER — Other Ambulatory Visit: Payer: Self-pay | Admitting: Family Medicine

## 2021-11-24 DIAGNOSIS — F321 Major depressive disorder, single episode, moderate: Secondary | ICD-10-CM

## 2021-11-24 DIAGNOSIS — M545 Low back pain, unspecified: Secondary | ICD-10-CM | POA: Diagnosis not present

## 2021-11-24 DIAGNOSIS — F4321 Adjustment disorder with depressed mood: Secondary | ICD-10-CM

## 2021-11-24 DIAGNOSIS — E119 Type 2 diabetes mellitus without complications: Secondary | ICD-10-CM

## 2021-11-24 DIAGNOSIS — M5416 Radiculopathy, lumbar region: Secondary | ICD-10-CM | POA: Diagnosis not present

## 2021-11-27 ENCOUNTER — Other Ambulatory Visit: Payer: Self-pay | Admitting: Nurse Practitioner

## 2021-12-07 ENCOUNTER — Other Ambulatory Visit: Payer: Self-pay | Admitting: Nurse Practitioner

## 2021-12-07 DIAGNOSIS — F419 Anxiety disorder, unspecified: Secondary | ICD-10-CM

## 2021-12-11 ENCOUNTER — Ambulatory Visit: Payer: HMO | Admitting: Nurse Practitioner

## 2021-12-11 ENCOUNTER — Other Ambulatory Visit: Payer: Self-pay

## 2021-12-11 MED ORDER — SIMVASTATIN 40 MG PO TABS: ORAL_TABLET | ORAL | 2 refills | Status: DC

## 2021-12-12 ENCOUNTER — Encounter: Payer: Self-pay | Admitting: Nurse Practitioner

## 2021-12-12 ENCOUNTER — Other Ambulatory Visit: Payer: Self-pay

## 2021-12-12 ENCOUNTER — Ambulatory Visit (INDEPENDENT_AMBULATORY_CARE_PROVIDER_SITE_OTHER): Payer: HMO | Admitting: Nurse Practitioner

## 2021-12-12 VITALS — BP 130/86 | HR 92 | Ht 68.0 in | Wt 177.0 lb

## 2021-12-12 DIAGNOSIS — F321 Major depressive disorder, single episode, moderate: Secondary | ICD-10-CM

## 2021-12-12 DIAGNOSIS — E782 Mixed hyperlipidemia: Secondary | ICD-10-CM

## 2021-12-12 DIAGNOSIS — I10 Essential (primary) hypertension: Secondary | ICD-10-CM

## 2021-12-12 DIAGNOSIS — K219 Gastro-esophageal reflux disease without esophagitis: Secondary | ICD-10-CM

## 2021-12-12 DIAGNOSIS — N1832 Chronic kidney disease, stage 3b: Secondary | ICD-10-CM | POA: Diagnosis not present

## 2021-12-12 DIAGNOSIS — E118 Type 2 diabetes mellitus with unspecified complications: Secondary | ICD-10-CM

## 2021-12-12 DIAGNOSIS — E119 Type 2 diabetes mellitus without complications: Secondary | ICD-10-CM | POA: Diagnosis not present

## 2021-12-12 DIAGNOSIS — E559 Vitamin D deficiency, unspecified: Secondary | ICD-10-CM | POA: Diagnosis not present

## 2021-12-12 DIAGNOSIS — N183 Chronic kidney disease, stage 3 unspecified: Secondary | ICD-10-CM | POA: Insufficient documentation

## 2021-12-12 MED ORDER — HYDROXYCHLOROQUINE SULFATE 200 MG PO TABS: 200.0000 mg | ORAL_TABLET | Freq: Every day | ORAL | 2 refills | Status: DC

## 2021-12-12 NOTE — Assessment & Plan Note (Signed)
Lab Results  ?Component Value Date  ? HGBA1C 5.2 08/11/2021  ? ?She has been taking glipizide 5mg  ,  ?Will check labs and stop med if labs are normal  ?On statin, ACE. ? ?

## 2021-12-12 NOTE — Assessment & Plan Note (Signed)
Takes Protonix 40mg  BID ?States that without meds her acid reflux is bad, ?Avoid fried fatty foods, chocolates, coffee.   ?Continue meds ?

## 2021-12-12 NOTE — Assessment & Plan Note (Signed)
Has been following up with therapy weekly ?PHQ 9 score improved from 12 to 8 ?Denies SI, HI. ?Continue BuSpar 7.5 mg twice daily, Cymbalta 30 mg twice daily, Zoloft 100 mg daily. ?Patient encouraged to maintain close follow-up with therapy.  ? ?

## 2021-12-12 NOTE — Assessment & Plan Note (Addendum)
BP Readings from Last 3 Encounters:  ?12/12/21 130/86  ?08/11/21 134/78  ?05/06/21 118/67  ?well controlled ?On Lasix 40 mg daily, lisinopril 2.5 mg daily, ?Check CMP plus EGFR today,  ?DASH diet advised ,exercise daily as tolerated ?

## 2021-12-12 NOTE — Patient Instructions (Addendum)
Pleas get your TDAP shingles vaccine , COVID booster at your pharmacy. ? ?It is important that you exercise regularly at least 30 minutes 5 times a week.  ?Think about what you will eat, plan ahead. ?Choose " clean, green, fresh or frozen" over canned, processed or packaged foods which are more sugary, salty and fatty. ?70 to 75% of food eaten should be vegetables and fruit. ?Three meals at set times with snacks allowed between meals, but they must be fruit or vegetables. ?Aim to eat over a 12 hour period , example 7 am to 7 pm, and STOP after  your last meal of the day. ?Drink water,generally about 64 ounces per day, no other drink is as healthy. Fruit juice is best enjoyed in a healthy way, by EATING the fruit. ? ?Thanks for choosing [facility], we consider it a privelige to serve you.  ?

## 2021-12-12 NOTE — Assessment & Plan Note (Signed)
Lab Results  ?Component Value Date  ? CHOL 204 (H) 08/11/2021  ? HDL 70 08/11/2021  ? LDLCALC 114 (H) 08/11/2021  ? TRIG 115 08/11/2021  ? CHOLHDL 3.4 02/25/2020  ?ON Crestor 40mg  daily. ?Check lipid panel  ?Avoid fried fatty foods ?

## 2021-12-12 NOTE — Progress Notes (Signed)
? ?LAGINA READER     MRN: [id number]      DOB: 26-Nov-1945 ? ? ?HPI ?[name] with past medical history of essential hypertension, GERD, diabetes mellitus type 2, hyperlipidemia, depression is here for follow up and re-evaluation of chronic medical conditions, medication management. ? ?The PT denies any adverse reactions to current medications since the last visit.  ? ?Stated that she has not been taking Fosamax, patient encouraged to start taking medication she verbalized understanding. ? ? ?Due for shingles vaccine tetanus vaccine COVID booster, need for all vaccination discussed with patient patient encouraged to get vaccines at her pharmacy. ? ?ROS ?Denies recent fever or chills. ?Denies sinus pressure, nasal congestion, ear pain or sore throat. ?Denies chest congestion, productive cough or wheezing. ?Denies chest pains, palpitations and leg swelling ?Denies abdominal pain, nausea, vomiting,diarrhea or constipation.   ?Denies dysuria, frequency, hesitancy or incontinence. ?Denies joint pain, swelling and limitation in mobility. ? ? ? ? ?PE ? ?BP 130/86 (BP Location: Right Arm, Patient Position: Sitting, Cuff Size: Large)   Pulse 92   Ht '5\' 8"'  (1.727 m)   Wt 177 lb (80.3 kg)   SpO2 98%   BMI 26.91 kg/m?  ? ?Patient alert and oriented and in no cardiopulmonary distress. ? ?Chest: Clear to auscultation bilaterally. ? ?CVS: S1, S2 no murmurs, no S3.Regular rate. ? ?ABD: Soft non tender.  ? ?Ext: No edema ? ?MS: Adequate ROM spine, shoulders, hips and knees. ? ? ?Psych: Good eye contact, normal affect. Memory intact not anxious or depressed appearing. ? ? ? ? ?Assessment & Plan ? ?Essential hypertension ?BP Readings from Last 3 Encounters:  ?12/12/21 130/86  ?08/11/21 134/78  ?05/06/21 118/67  ?well controlled ?On Lasix 40 mg daily, lisinopril 2.5 mg daily, ?Check CMP plus EGFR today,  ?DASH diet advised ,exercise daily as tolerated ? ?Gastroesophageal reflux disease ?Takes Protonix 2m BID ?States that without  meds her acid reflux is bad, ?Avoid fried fatty foods, chocolates, coffee.   ?Continue meds ? ?Controlled diabetes mellitus type 2 with complications ([facility] ?Lab Results  ?Component Value Date  ? HGBA1C 5.2 08/11/2021  ? ?She has been taking glipizide 537m,  ?Will check labs and stop med if labs are normal  ?On statin, ACE. ? ? ?Hyperlipidemia ?Lab Results  ?Component Value Date  ? CHOL 204 (H) 08/11/2021  ? HDL 70 08/11/2021  ? LDLCALC 114 (H) 08/11/2021  ? TRIG 115 08/11/2021  ? CHOLHDL 3.4 02/25/2020  ?ON Crestor 4012maily. ?Check lipid panel  ?Avoid fried fatty foods ? ?Depression, major, single episode, moderate (HCCCentervilleHas been following up with therapy weekly ?PHQ 9 score improved from 12 to 8 ?Denies SI, HI. ?Continue BuSpar 7.5 mg twice daily, Cymbalta 30 mg twice daily, Zoloft 100 mg daily. ?Patient encouraged to maintain close follow-up with therapy.  ? ? ?CKD (chronic kidney disease) stage 3, GFR 30-59 ml/min (HCC) ?Lab Results  ?Component Value Date  ? NA 140 08/11/2021  ? K 4.2 08/11/2021  ? CO2 19 (L) 08/11/2021  ? GLUCOSE 118 (H) 08/11/2021  ? BUN 13 08/11/2021  ? CREATININE 1.44 (H) 08/11/2021  ? CALCIUM 9.3 08/11/2021  ? EGFR 38 (L) 08/11/2021  ? GFRNONAA 53 (L) 05/06/2021  ?Check CMP plus EGFR today ?Avoid ibuprofen and other nephrotoxic agent ?Drink at least 64 ounces of water daily. ?Currently on lisinopril 2.5 mg Lasix 40 mg daily. ?If labs not improved will increase dose of lisinopril to 5 mg daily and DC Lasix40m56m? ?  Vitamin D deficiency ?On vitamin D 50,000 units once weekly ?Check vitamin D levels today. Will stop med if labs are normal.  ?Last vitamin D ?Lab Results  ?Component Value Date  ? VD25OH 50.6 08/11/2021  ?  ?

## 2021-12-12 NOTE — Assessment & Plan Note (Signed)
On vitamin D 50,000 units once weekly ?Check vitamin D levels today. Will stop med if labs are normal.  ?Last vitamin D ?Lab Results  ?Component Value Date  ? VD25OH 50.6 08/11/2021  ? ?

## 2021-12-12 NOTE — Assessment & Plan Note (Addendum)
Lab Results  ?Component Value Date  ? NA 140 08/11/2021  ? K 4.2 08/11/2021  ? CO2 19 (L) 08/11/2021  ? GLUCOSE 118 (H) 08/11/2021  ? BUN 13 08/11/2021  ? CREATININE 1.44 (H) 08/11/2021  ? CALCIUM 9.3 08/11/2021  ? EGFR 38 (L) 08/11/2021  ? GFRNONAA 53 (L) 05/06/2021  ?Check CMP plus EGFR today ?Avoid ibuprofen and other nephrotoxic agent ?Drink at least 64 ounces of water daily. ?Currently on lisinopril 2.5 mg Lasix 40 mg daily. ?If labs not improved will increase dose of lisinopril to 5 mg daily and DC Lasix21m.  ?

## 2021-12-19 ENCOUNTER — Other Ambulatory Visit: Payer: Self-pay | Admitting: Nurse Practitioner

## 2021-12-19 MED ORDER — OXYBUTYNIN CHLORIDE 5 MG PO TABS: ORAL_TABLET | ORAL | 0 refills | Status: DC

## 2021-12-26 ENCOUNTER — Other Ambulatory Visit: Payer: Self-pay | Admitting: Nurse Practitioner

## 2021-12-29 ENCOUNTER — Telehealth: Payer: HMO

## 2022-01-02 ENCOUNTER — Telehealth: Payer: Self-pay | Admitting: *Deleted

## 2022-01-02 NOTE — Chronic Care Management (AMB) (Signed)
  Chronic Care Management Note  01/02/2022 Name: [name] MRN: [id number] DOB: July 14, 1946  [name] is a 76 y.o. year old female who is a primary care patient of Renee Rival, FNP and is actively engaged with the care management team. I reached out to [name] by phone today to assist with re-scheduling a follow up visit with the Licensed Clinical Social Worker  Follow up plan: Unsuccessful telephone outreach attempt made. A HIPAA compliant phone message was left for the patient providing contact information and requesting a return call.  The care management team will reach out to the patient again over the next 7 days.  If patient returns call to provider office, please advise to call [place]  at [phone].  [place] Management  Direct Dial: [phone]

## 2022-01-05 ENCOUNTER — Other Ambulatory Visit: Payer: Self-pay | Admitting: Nurse Practitioner

## 2022-01-05 ENCOUNTER — Other Ambulatory Visit: Payer: Self-pay

## 2022-01-05 MED ORDER — FUROSEMIDE 40 MG PO TABS: 40.0000 mg | ORAL_TABLET | Freq: Every day | ORAL | 0 refills | Status: DC

## 2022-01-05 MED ORDER — LISINOPRIL 2.5 MG PO TABS: ORAL_TABLET | ORAL | 0 refills | Status: DC

## 2022-01-06 ENCOUNTER — Other Ambulatory Visit: Payer: Self-pay | Admitting: Nurse Practitioner

## 2022-01-06 MED ORDER — FUROSEMIDE 40 MG PO TABS: 40.0000 mg | ORAL_TABLET | Freq: Every day | ORAL | 0 refills | Status: DC

## 2022-01-06 MED ORDER — LISINOPRIL 2.5 MG PO TABS: ORAL_TABLET | ORAL | 0 refills | Status: DC

## 2022-01-09 ENCOUNTER — Other Ambulatory Visit: Payer: Self-pay

## 2022-01-09 ENCOUNTER — Telehealth: Payer: Self-pay | Admitting: Nurse Practitioner

## 2022-01-09 DIAGNOSIS — F419 Anxiety disorder, unspecified: Secondary | ICD-10-CM

## 2022-01-09 MED ORDER — BUSPIRONE HCL 7.5 MG PO TABS: ORAL_TABLET | ORAL | 1 refills | Status: DC

## 2022-01-09 NOTE — Chronic Care Management (AMB) (Signed)
  Chronic Care Management Note  01/09/2022 Name: [name] MRN: [id number] DOB: [birth year]  [name] is a 76 y.o. year old female who is a primary care patient of Donell Beers, FNP and is actively engaged with the care management team. I reached out to [name] by phone today to assist with re-scheduling a follow up visit with the Licensed Clinical Social Worker  Follow up plan: Unsuccessful telephone outreach attempt made. A HIPAA compliant phone message was left for the patient providing contact information and requesting a return call.  The care management team will reach out to the patient again over the next 7 days.  If patient returns call to provider office, please advise to call [facility] Care Management Care Guide Misty Stanley at [phone].  Gwenevere Ghazi  Care Guide, Embedded Care Coordination [facility] Management  Direct Dial: [phone]

## 2022-01-09 NOTE — Telephone Encounter (Signed)
Pt returned call, states she is awake now & will be listening for the phone call  ?

## 2022-01-10 NOTE — Telephone Encounter (Signed)
Not sure who called pt yesterday. Spoke with pt today advised that she needs to come in and have labs done pt verbalized understanding ?

## 2022-01-17 NOTE — Chronic Care Management (AMB) (Signed)
  Chronic Care Management Note  01/17/2022 Name: [name] MRN: [id number] DOB: 07-25-1946  [name] is a 76 y.o. year old female who is a primary care patient of Renee Rival, FNP and is actively engaged with the care management team. I reached out to [employer] by phone today to assist with re-scheduling a follow up visit with the Licensed Clinical Social Worker  Follow up plan: A third unsuccessful telephone outreach attempt made. A HIPAA compliant phone message was left for the patient providing contact information and requesting a return call. Unable to make contact on outreach attempts x 3. PCP Renee Rival, FNP notified via routed documentation in medical record. We have been unable to make contact with the patient for follow up. The care management team is available to follow up with the patient after provider conversation with the patient regarding recommendation for care management engagement and subsequent re-referral to the care management team.   [place] Management  Direct Dial: [phone]

## 2022-01-25 ENCOUNTER — Other Ambulatory Visit: Payer: Self-pay | Admitting: Nurse Practitioner

## 2022-01-25 DIAGNOSIS — G894 Chronic pain syndrome: Secondary | ICD-10-CM | POA: Diagnosis not present

## 2022-01-25 DIAGNOSIS — M961 Postlaminectomy syndrome, not elsewhere classified: Secondary | ICD-10-CM | POA: Diagnosis not present

## 2022-01-25 DIAGNOSIS — E1142 Type 2 diabetes mellitus with diabetic polyneuropathy: Secondary | ICD-10-CM | POA: Diagnosis not present

## 2022-01-25 DIAGNOSIS — M130 Polyarthritis, unspecified: Secondary | ICD-10-CM | POA: Diagnosis not present

## 2022-02-07 ENCOUNTER — Other Ambulatory Visit: Payer: Self-pay | Admitting: Nurse Practitioner

## 2022-02-07 DIAGNOSIS — F419 Anxiety disorder, unspecified: Secondary | ICD-10-CM

## 2022-02-08 ENCOUNTER — Telehealth: Payer: HMO

## 2022-02-12 ENCOUNTER — Ambulatory Visit (INDEPENDENT_AMBULATORY_CARE_PROVIDER_SITE_OTHER): Payer: HMO

## 2022-02-12 ENCOUNTER — Other Ambulatory Visit: Payer: Self-pay | Admitting: Nurse Practitioner

## 2022-02-12 DIAGNOSIS — F321 Major depressive disorder, single episode, moderate: Secondary | ICD-10-CM

## 2022-02-12 DIAGNOSIS — E119 Type 2 diabetes mellitus without complications: Secondary | ICD-10-CM

## 2022-02-12 DIAGNOSIS — K219 Gastro-esophageal reflux disease without esophagitis: Secondary | ICD-10-CM

## 2022-02-12 DIAGNOSIS — G4709 Other insomnia: Secondary | ICD-10-CM

## 2022-02-12 DIAGNOSIS — F419 Anxiety disorder, unspecified: Secondary | ICD-10-CM

## 2022-02-12 DIAGNOSIS — F4321 Adjustment disorder with depressed mood: Secondary | ICD-10-CM

## 2022-02-12 NOTE — Patient Instructions (Addendum)
Visit Information  Patient Goals:  Depressive Symptoms Identified.  Manage Depression symptoms. Manage Grief issues  Time Frame:  Short Term Goal Priority:  Medium Progress: Not On Track  Start Date:  11/10/21 End Date:  05/17/22    Follow up Date:  02/16/22 at 12:45 PM   Depressive Symptoms Identified. Manage Depression symptoms. Manage Grief issues  Patient Self Care Activities:  Attends medical appointments Takes medications as prescribed  Patient Coping Strengths:  Has support from her spouse and from her son Has transport help as needed Has support from her friends  Patient Self Care Deficits:  Experiencing Grief issues Reduced appetite Reduced sleep  Patient Goals:  - spend time or talk with others at least 2 to 3 times per week - practice relaxation or meditation daily - keep a calendar with appointment dates  Follow Up Plan: LCSW to call client on 02/16/22 at 12:45 PM   [name].[name] MSW, LCSW Licensed Visual merchandiser [facility] Care Management [phone]

## 2022-02-12 NOTE — Chronic Care Management (AMB) (Signed)
Chronic Care Management    Clinical Social Work Note  02/12/2022 Name: [name] MRN: [id number] DOB: 1946/01/14  [name] is a 76 y.o. year old female who is a primary care patient of Donell Beers, FNP. The CCM team was consulted to assist the patient with chronic disease management and/or care coordination needs related to: [employer] .   Engaged with patient by telephone for follow up visit in response to provider referral for social work chronic care management and care coordination services.   Consent to Services:  The patient was given information about Chronic Care Management services, agreed to services, and gave verbal consent prior to initiation of services.  Please see initial visit note for detailed documentation.   Patient agreed to services and consent obtained.   Assessment: Review of patient past medical history, allergies, medications, and health status, including review of relevant consultants reports was performed today as part of a comprehensive evaluation and provision of chronic care management and care coordination services.     SDOH (Social Determinants of Health) assessments and interventions performed:  SDOH Interventions    Flowsheet Row Most Recent Value  SDOH Interventions   Stress Interventions Provide Counseling  [client has stress related to grief issues experienced]  Depression Interventions/Treatment  Counseling, Medication        Advanced Directives Status: See Vynca application for related entries.  CCM Care Plan  Allergies  Allergen Reactions   Darvon [Propoxyphene] Nausea And Vomiting   Morphine And Related Itching and Rash   Penicillins Itching and Rash    Pt denies this allergy    Outpatient Encounter Medications as of 02/12/2022  Medication Sig   albuterol (VENTOLIN HFA) 108 (90 Base) MCG/ACT inhaler Inhale 1-2 puffs into the lungs every 6 (six) hours as needed for wheezing or shortness of breath.    alendronate (FOSAMAX) 70 MG tablet Take 1 tablet (70 mg total) by mouth once a week. Take with a full glass of water on an empty stomach. (Patient not taking: Reported on 12/12/2021)   busPIRone (BUSPAR) 7.5 MG tablet TAKE (1) TABLET BY MOUTH TWICE DAILY.   doxepin (SINEQUAN) 10 MG/ML solution Take 10 mg by mouth in the morning and at bedtime. Patient takes one dose (66ml) up to twice daily   DULoxetine (CYMBALTA) 30 MG capsule Take 30 mg by mouth 2 (two) times daily.   furosemide (LASIX) 40 MG tablet Take 1 tablet (40 mg total) by mouth daily.   glipiZIDE (GLUCOTROL XL) 5 MG 24 hr tablet Take 5 mg by mouth daily with breakfast.   hydroxychloroquine (PLAQUENIL) 200 MG tablet TAKE ONE TABLET BY MOUTH ONCE DAILY.   lisinopril (ZESTRIL) 2.5 MG tablet Take 1 tablet by mouth once daily for blood pressure.   methocarbamol (ROBAXIN) 500 MG tablet Take 500 mg by mouth 4 (four) times daily.   oxybutynin (DITROPAN) 5 MG tablet TAKE (1) TABLET BY MOUTH (3) TIMES DAILY.   oxyCODONE-acetaminophen (PERCOCET) 10-325 MG tablet Take 1 tablet by mouth every 6 (six) hours as needed for pain.   pantoprazole (PROTONIX) 40 MG tablet Take 40 mg by mouth 2 (two) times daily.   potassium chloride SA (KLOR-CON M) 20 MEQ tablet Take 1 tablet (20 mEq total) by mouth daily.   pregabalin (LYRICA) 100 MG capsule Take 100-200 mg by mouth See admin instructions. 100mg  taken in the morning and 200mg  at bedtime   sertraline (ZOLOFT) 100 MG tablet TAKE 1 1/2 TABLETS (150MG ) BY MOUTH DAILY  simvastatin (ZOCOR) 40 MG tablet TAKE (1) TABLET BY MOUTH AT BEDTIME.   sodium bicarbonate 650 MG tablet TAKE (1) TABLET BY MOUTH TWICE DAILY.   Vitamin D, Ergocalciferol, (DRISDOL) 1.25 MG (50000 UNIT) CAPS capsule TAKE 1 CAPSULE THE SAME DAY EACH WEEK.   No facility-administered encounter medications on file as of 02/12/2022.    Patient Active Problem List   Diagnosis Date Noted   CKD (chronic kidney disease) stage 3, GFR 30-59 ml/min ([place])  12/12/2021   Vitamin D deficiency 12/12/2021   Grief 09/05/2021   Insomnia 08/12/2021   Need for immunization against influenza 08/11/2021   Overactive bladder 10/11/2020   Anxiety 09/13/2020   Tongue pain 04/22/2020   Gastroesophageal reflux disease 03/03/2020   Laryngopharyngeal reflux (LPR) 03/03/2020   Depression, major, single episode, moderate ([place]) 12/02/2019   Controlled diabetes mellitus type 2 with complications ([place]) [id number]   Hyperlipidemia 12/02/2019   Essential hypertension 12/02/2019   Lumbar radiculopathy 10/31/2019   Polyarthropathy 10/31/2019   Polyneuropathy due to type 2 diabetes mellitus ([place]) 10/31/2019   Chronic pain syndrome 10/31/2019   Bilateral sensorineural hearing loss 02/11/2017   Right ear impacted cerumen 02/11/2017   Sarcoidosis 03/02/2014   Low back pain 03/02/2014    Conditions to be addressed/monitored: monitor client management of grief issues  Care Plan : LCSW care plan  Updates made by Katha Cabal, LCSW since 02/12/2022 12:00 AM     Problem: Emotional Distress      Goal: Emotional Health Supported.  Manage Grief issues faced   Start Date: 11/10/2021  Expected End Date: 05/17/2022  This Visit's Progress: Not on track  Recent Progress: Not on track  Priority: Medium  Note:   Current Barriers:  Grief issues Stress issues Depression issues Suicidal Ideation/Homicidal Ideation: No  Clinical Social Work Goal(s):  patient will work with SW monthly by telephone or in person to reduce or manage symptoms related to Grief Patient will work with SW monthly to discuss depression issues of client Client to call RNCM as needed in next 30 days to discuss nursing needs of client  Interventions: 1:1 collaboration with Renee Rival, FNP regarding development and update of comprehensive plan of care as evidenced by provider attestation and co-signature Discussed client needs with Pierce Crane Discussed grief issues faced by Mechele Claude.  Talked with Eponine about family support.. she has support from her spouse and from her son.  She has friends she talks with about grief issues. Encouraged client to call [facility] to learn more about Grief Share program. Gave client phone number for [facility] [place], [place]. Discussed Grief Share Program with client and ways it may help those who are experiencing grief issues Provided counseling support for client. Discussed sleeping issues of client. She said she does have some sleeping difficulty Discussed pain issues of client. She sees Dr. Merlene Laughter, neurologist, related to pain issues Discussed mood issues of client. She discussed sadness related to grief issues over death of her son in 2021-04-05. She said that talking with family and friends about grief issues has been helpful.   Client discussed her difficulty in sitting very long due to back pain issues Reviewed vision issues of client.  Reviewed medication procurement of client Discussed transport needs of client Reviewed mobility of client  Patient Self Care Activities:  Attends medical appointments Takes medications as prescribed  Patient Coping Strengths:  Has support from her spouse and from her son Has transport help as needed Has  support from her friends  Patient Self Care Deficits:  Experiencing Grief issues Reduced appetite Reduced sleep  Patient Goals:  - spend time or talk with others at least 2 to 3 times per week - practice relaxation or meditation daily - keep a calendar with appointment dates  Follow Up Plan: LCSW to call client on 02/16/22 at 12:45 PM to discuss client needs.     Kelton Pillar.Cleveland Paiz MSW, LCSW Licensed Visual merchandiser [facility] Care Management [phone]

## 2022-02-16 ENCOUNTER — Telehealth: Payer: HMO

## 2022-02-23 ENCOUNTER — Other Ambulatory Visit: Payer: Self-pay

## 2022-02-23 DIAGNOSIS — E119 Type 2 diabetes mellitus without complications: Secondary | ICD-10-CM

## 2022-02-23 DIAGNOSIS — E118 Type 2 diabetes mellitus with unspecified complications: Secondary | ICD-10-CM

## 2022-02-23 DIAGNOSIS — F321 Major depressive disorder, single episode, moderate: Secondary | ICD-10-CM

## 2022-02-23 DIAGNOSIS — I10 Essential (primary) hypertension: Secondary | ICD-10-CM

## 2022-02-23 DIAGNOSIS — F4321 Adjustment disorder with depressed mood: Secondary | ICD-10-CM

## 2022-02-26 ENCOUNTER — Other Ambulatory Visit: Payer: Self-pay | Admitting: Nurse Practitioner

## 2022-03-01 ENCOUNTER — Other Ambulatory Visit: Payer: Self-pay

## 2022-03-01 MED ORDER — PANTOPRAZOLE SODIUM 40 MG PO TBEC: 40.0000 mg | DELAYED_RELEASE_TABLET | Freq: Two times a day (BID) | ORAL | 2 refills | Status: DC

## 2022-03-09 ENCOUNTER — Other Ambulatory Visit: Payer: Self-pay

## 2022-03-09 DIAGNOSIS — F419 Anxiety disorder, unspecified: Secondary | ICD-10-CM

## 2022-03-09 MED ORDER — BUSPIRONE HCL 7.5 MG PO TABS: ORAL_TABLET | ORAL | 1 refills | Status: DC

## 2022-03-14 ENCOUNTER — Other Ambulatory Visit: Payer: Self-pay | Admitting: Nurse Practitioner

## 2022-03-14 DIAGNOSIS — H903 Sensorineural hearing loss, bilateral: Secondary | ICD-10-CM | POA: Diagnosis not present

## 2022-03-14 DIAGNOSIS — H9313 Tinnitus, bilateral: Secondary | ICD-10-CM | POA: Diagnosis not present

## 2022-03-30 ENCOUNTER — Other Ambulatory Visit: Payer: Self-pay | Admitting: Family Medicine

## 2022-04-10 ENCOUNTER — Other Ambulatory Visit: Payer: Self-pay | Admitting: Nurse Practitioner

## 2022-04-13 ENCOUNTER — Other Ambulatory Visit: Payer: Self-pay | Admitting: Internal Medicine

## 2022-04-13 NOTE — Telephone Encounter (Signed)
Ok to refill? Please advise.  

## 2022-04-13 NOTE — Telephone Encounter (Signed)
Spoke with pt advised of Fola's message ?

## 2022-04-16 ENCOUNTER — Telehealth: Payer: Self-pay | Admitting: *Deleted

## 2022-04-16 NOTE — Chronic Care Management (AMB) (Unsigned)
  Care Coordination Note  04/16/2022 Name: ROMELL CAVANAH MRN: [id number] DOB: August 10, 1946  [name] is a 76 y.o. year old female who is a primary care patient of Donell Beers, FNP and is actively engaged with the care management team. I reached out to [name] by phone today to assist with scheduling a follow up visit with the Licensed Clinical Social Worker  Follow up plan: Unsuccessful telephone outreach attempt made. A HIPAA compliant phone message was left for the patient providing contact information and requesting a return call.  The care management team will reach out to the patient again over the next 7 days.  If patient returns call to provider office, please advise to call Embedded Care Management Care Guide Misty Stanley at [phone].  [facility]  Care Coordination Care Guide  Direct Dial: [phone]

## 2022-04-17 NOTE — Chronic Care Management (AMB) (Unsigned)
  Chronic Care Management Note  04/17/2022 Name: MARONDA CAISON MRN: [id number] DOB: 1946/05/26  [name] is a 76 y.o. year old female who is a primary care patient of Donell Beers, FNP and is actively engaged with the care management team. I reached out to [name] by phone today to assist with scheduling a follow up visit with the Licensed Clinical Social Worker  Follow up plan: Unsuccessful telephone outreach attempt made. A HIPAA compliant phone message was left for the patient providing contact information and requesting a return call.  The care management team will reach out to the patient again over the next 2 days.   [facility]  Care Coordination Care Guide  Direct Dial: [phone]

## 2022-04-18 DIAGNOSIS — I10 Essential (primary) hypertension: Secondary | ICD-10-CM | POA: Diagnosis not present

## 2022-04-18 DIAGNOSIS — M130 Polyarthritis, unspecified: Secondary | ICD-10-CM | POA: Diagnosis not present

## 2022-04-18 DIAGNOSIS — E559 Vitamin D deficiency, unspecified: Secondary | ICD-10-CM | POA: Diagnosis not present

## 2022-04-18 DIAGNOSIS — G894 Chronic pain syndrome: Secondary | ICD-10-CM | POA: Diagnosis not present

## 2022-04-18 DIAGNOSIS — Z79891 Long term (current) use of opiate analgesic: Secondary | ICD-10-CM | POA: Diagnosis not present

## 2022-04-18 DIAGNOSIS — E782 Mixed hyperlipidemia: Secondary | ICD-10-CM | POA: Diagnosis not present

## 2022-04-18 DIAGNOSIS — E1142 Type 2 diabetes mellitus with diabetic polyneuropathy: Secondary | ICD-10-CM | POA: Diagnosis not present

## 2022-04-18 DIAGNOSIS — M961 Postlaminectomy syndrome, not elsewhere classified: Secondary | ICD-10-CM | POA: Diagnosis not present

## 2022-04-18 DIAGNOSIS — E119 Type 2 diabetes mellitus without complications: Secondary | ICD-10-CM | POA: Diagnosis not present

## 2022-04-19 LAB — HEMOGLOBIN A1C
Est. average glucose Bld gHb Est-mCnc: 120 mg/dL
Hgb A1c MFr Bld: 5.8 % — ABNORMAL HIGH (ref 4.8–5.6)

## 2022-04-19 LAB — LIPID PANEL
Chol/HDL Ratio: 3.2 ratio (ref 0.0–4.4)
Cholesterol, Total: 192 mg/dL (ref 100–199)
HDL: 60 mg/dL (ref >39)
LDL Chol Calc (NIH): 107 mg/dL — ABNORMAL HIGH (ref 0–99)
Triglycerides: 143 mg/dL (ref 0–149)
VLDL Cholesterol Cal: 25 mg/dL (ref 5–40)

## 2022-04-19 LAB — CMP14+EGFR
ALT: 7 IU/L (ref 0–32)
AST: 14 IU/L (ref 0–40)
Albumin/Globulin Ratio: 1.5 (ref 1.2–2.2)
Albumin: 4 g/dL (ref 3.8–4.8)
Alkaline Phosphatase: 54 IU/L (ref 44–121)
BUN/Creatinine Ratio: 11 — ABNORMAL LOW (ref 12–28)
BUN: 12 mg/dL (ref 8–27)
Bilirubin Total: 0.5 mg/dL (ref 0.0–1.2)
CO2: 26 mmol/L (ref 20–29)
Calcium: 9.6 mg/dL (ref 8.7–10.3)
Chloride: 103 mmol/L (ref 96–106)
Creatinine, Ser: 1.08 mg/dL — ABNORMAL HIGH (ref 0.57–1.00)
Globulin, Total: 2.6 g/dL (ref 1.5–4.5)
Glucose: 129 mg/dL — ABNORMAL HIGH (ref 70–99)
Potassium: 4.5 mmol/L (ref 3.5–5.2)
Sodium: 144 mmol/L (ref 134–144)
Total Protein: 6.6 g/dL (ref 6.0–8.5)
eGFR: 54 mL/min/{1.73_m2} — ABNORMAL LOW (ref >59)

## 2022-04-19 LAB — VITAMIN D 25 HYDROXY (VIT D DEFICIENCY, FRACTURES): Vit D, 25-Hydroxy: 36.2 ng/mL (ref 30.0–100.0)

## 2022-04-19 NOTE — Chronic Care Management (AMB) (Signed)
  Chronic Care Management Note  04/19/2022 Name: JANENE YOUSUF MRN: [id number] DOB: November 23, 1945  [name] is a 76 y.o. year old female who is a primary care patient of Donell Beers, FNP and is actively engaged with the care management team. I reached out to [name] by phone today to assist with re-scheduling a follow up visit with the Licensed Clinical Social Worker  Follow up plan: Unsuccessful telephone outreach attempt made. A HIPAA compliant phone message was left for the patient providing contact information and requesting a return call.  Unable to make contact on outreach attempts x 2. PCP Donell Beers, FNP notified via routed documentation in medical record.  We have been unable to make contact with the patient for follow up. The care management team is available to follow up with the patient after provider conversation with the patient regarding recommendation for care management engagement and subsequent re-referral to the care management team.   [facility] Coordination Care Guide  Direct Dial: [phone]

## 2022-04-20 ENCOUNTER — Other Ambulatory Visit: Payer: Self-pay | Admitting: Nurse Practitioner

## 2022-04-20 MED ORDER — EZETIMIBE 10 MG PO TABS: 10.0000 mg | ORAL_TABLET | Freq: Every day | ORAL | 1 refills | Status: DC

## 2022-04-20 NOTE — Progress Notes (Signed)
LDL not at goal of less than 70, start zetia 10mg  daily , continue current dose of simvastatin 40mg  daily..   A1C is well controlled, I had discussed stopping glipizide at her previous visit, is she still taking glipizide, if yes we will cut down to 2.5mg  daily instead of 5 mg daily to avoid hypoglycemia.   Pt should bring all her medication to her upcoming appointment.   Kidney function is stable.

## 2022-04-26 ENCOUNTER — Encounter: Payer: HMO | Admitting: Nurse Practitioner

## 2022-05-01 ENCOUNTER — Other Ambulatory Visit: Payer: Self-pay | Admitting: Nurse Practitioner

## 2022-05-01 ENCOUNTER — Other Ambulatory Visit: Payer: Self-pay | Admitting: Family Medicine

## 2022-05-04 ENCOUNTER — Other Ambulatory Visit: Payer: Self-pay | Admitting: Nurse Practitioner

## 2022-05-04 DIAGNOSIS — F419 Anxiety disorder, unspecified: Secondary | ICD-10-CM

## 2022-05-07 ENCOUNTER — Other Ambulatory Visit: Payer: Self-pay | Admitting: Nurse Practitioner

## 2022-05-10 ENCOUNTER — Encounter: Payer: HMO | Admitting: Nurse Practitioner

## 2022-05-14 ENCOUNTER — Other Ambulatory Visit: Payer: Self-pay | Admitting: Nurse Practitioner

## 2022-05-22 ENCOUNTER — Other Ambulatory Visit: Payer: Self-pay | Admitting: Nurse Practitioner

## 2022-05-23 ENCOUNTER — Other Ambulatory Visit: Payer: Self-pay | Admitting: Nurse Practitioner

## 2022-05-31 ENCOUNTER — Other Ambulatory Visit: Payer: Self-pay | Admitting: Family Medicine

## 2022-06-01 ENCOUNTER — Other Ambulatory Visit: Payer: Self-pay

## 2022-06-01 ENCOUNTER — Telehealth: Payer: Self-pay

## 2022-06-01 DIAGNOSIS — K219 Gastro-esophageal reflux disease without esophagitis: Secondary | ICD-10-CM

## 2022-06-01 DIAGNOSIS — I10 Essential (primary) hypertension: Secondary | ICD-10-CM

## 2022-06-01 DIAGNOSIS — N3281 Overactive bladder: Secondary | ICD-10-CM

## 2022-06-01 DIAGNOSIS — F419 Anxiety disorder, unspecified: Secondary | ICD-10-CM

## 2022-06-01 MED ORDER — FUROSEMIDE 40 MG PO TABS: 40.0000 mg | ORAL_TABLET | Freq: Every day | ORAL | 0 refills | Status: DC

## 2022-06-01 MED ORDER — SODIUM BICARBONATE 650 MG PO TABS: ORAL_TABLET | ORAL | 2 refills | Status: DC

## 2022-06-01 MED ORDER — PANTOPRAZOLE SODIUM 40 MG PO TBEC: 40.0000 mg | DELAYED_RELEASE_TABLET | Freq: Two times a day (BID) | ORAL | 2 refills | Status: DC

## 2022-06-01 MED ORDER — OXYBUTYNIN CHLORIDE 5 MG PO TABS: ORAL_TABLET | ORAL | 0 refills | Status: DC

## 2022-06-01 MED ORDER — BUSPIRONE HCL 7.5 MG PO TABS: ORAL_TABLET | ORAL | 0 refills | Status: DC

## 2022-06-01 NOTE — Telephone Encounter (Signed)
Patient need med refills  pantoprazole (PROTONIX) 40 MG tablet   furosemide (LASIX) 40 MG tablet   oxybutynin (DITROPAN) 5 MG tablet   busPIRone (BUSPAR) 7.5 MG tablet  sodium bicarbonate 650 MG tablet     Pharmacy  [facility] [place]  [address], [place] [address]  Phone:  [phone]  Fax:  [phone]     PLEASE CONTACT PATIENT IF CAN NOT BE FILLED TODAY. [phone].

## 2022-06-01 NOTE — Telephone Encounter (Signed)
Refills have been sent.  

## 2022-06-01 NOTE — Telephone Encounter (Signed)
Patient called said she will contact her pharmacy to have her meds delivered. She said tell the nurse Venetia Night thank you.

## 2022-06-06 ENCOUNTER — Other Ambulatory Visit: Payer: Self-pay

## 2022-06-06 ENCOUNTER — Telehealth: Payer: Self-pay | Admitting: Family Medicine

## 2022-06-06 NOTE — Telephone Encounter (Signed)
Pt called stating she is needing a new referral for neurology since Leigh is leaving.

## 2022-06-06 NOTE — Telephone Encounter (Signed)
Placed a referral to [facility] in [place]

## 2022-06-06 NOTE — Telephone Encounter (Signed)
Please advice  

## 2022-06-08 ENCOUNTER — Other Ambulatory Visit: Payer: Self-pay

## 2022-06-08 ENCOUNTER — Telehealth: Payer: Self-pay | Admitting: Family Medicine

## 2022-06-08 DIAGNOSIS — D869 Sarcoidosis, unspecified: Secondary | ICD-10-CM

## 2022-06-08 DIAGNOSIS — M5416 Radiculopathy, lumbar region: Secondary | ICD-10-CM

## 2022-06-08 NOTE — Telephone Encounter (Signed)
Called in requesting pain management referral instead of neurology referral.  Patient wants a call back

## 2022-06-08 NOTE — Telephone Encounter (Signed)
Referral updated

## 2022-06-08 NOTE — Progress Notes (Signed)
.  pain

## 2022-06-08 NOTE — Telephone Encounter (Signed)
Pt informed

## 2022-06-12 ENCOUNTER — Other Ambulatory Visit: Payer: Self-pay | Admitting: Nurse Practitioner

## 2022-06-22 ENCOUNTER — Other Ambulatory Visit: Payer: Self-pay | Admitting: Nurse Practitioner

## 2022-07-02 ENCOUNTER — Other Ambulatory Visit: Payer: Self-pay | Admitting: Nurse Practitioner

## 2022-07-02 DIAGNOSIS — F419 Anxiety disorder, unspecified: Secondary | ICD-10-CM

## 2022-07-04 ENCOUNTER — Ambulatory Visit (INDEPENDENT_AMBULATORY_CARE_PROVIDER_SITE_OTHER): Payer: HMO | Admitting: Family Medicine

## 2022-07-04 ENCOUNTER — Encounter: Payer: Self-pay | Admitting: Family Medicine

## 2022-07-04 VITALS — BP 138/83 | HR 94 | Ht 68.0 in | Wt 177.0 lb

## 2022-07-04 DIAGNOSIS — Z1211 Encounter for screening for malignant neoplasm of colon: Secondary | ICD-10-CM

## 2022-07-04 DIAGNOSIS — Z23 Encounter for immunization: Secondary | ICD-10-CM | POA: Diagnosis not present

## 2022-07-04 DIAGNOSIS — J329 Chronic sinusitis, unspecified: Secondary | ICD-10-CM | POA: Insufficient documentation

## 2022-07-04 DIAGNOSIS — Z0001 Encounter for general adult medical examination with abnormal findings: Secondary | ICD-10-CM

## 2022-07-04 MED ORDER — AZELASTINE HCL 0.1 % NA SOLN: 2.0000 | Freq: Two times a day (BID) | NASAL | 12 refills | Status: DC

## 2022-07-04 NOTE — Patient Instructions (Signed)
I appreciate the opportunity to provide care to you today!    Follow up:  2 months  Labs: next visit      Please continue to a heart-healthy diet and increase your physical activities. Try to exercise for at least three times a week.      It was a pleasure to see you and I look forward to continuing to work together on your health and well-being. Please do not hesitate to call the office if you need care or have questions about your care.   Have a wonderful day and week. With Gratitude, Gilmore Laroche MSN, FNP-BC

## 2022-07-04 NOTE — Progress Notes (Signed)
Complete physical exam  Patient: [name]   DOB: 1945/11/13   76 y.o. Female  MRN: [id number]  Subjective:    Chief Complaint  Patient presents with   Annual Exam    Cpe today. Been having sinus problems in the last 6 months ( 12/2021)    [name] is a 76 y.o. female who presents today for a complete physical exam. She reports consuming a general diet. The patient does not participate in regular exercise at present. She generally feels well. She reports not sleeping well due to grieving the loss of her son in August 2022. She does have additional problems to discuss today.   Sinus problems: She complains of nasal congestion.  She denies cough, sinus pain, sinus headache, sore throat, and fever.  She denies purulent nasal discharge.  Most recent fall risk assessment:    07/04/2022    4:15 PM  Fall Risk   Falls in the past year? 0  Number falls in past yr: 0  Injury with Fall? 0  Risk for fall due to : No Fall Risks  Follow up Falls evaluation completed     Most recent depression screenings:    07/04/2022    4:15 PM 02/12/2022    2:18 PM  PHQ 2/9 Scores  PHQ - 2 Score 2 4  PHQ- 9 Score 2 9    Dental: No current dental problems and Last dental visit: 04/2021  Patient Active Problem List   Diagnosis Date Noted   Sinus infection 07/04/2022   Encounter for general adult medical examination with abnormal findings 07/04/2022   CKD (chronic kidney disease) stage 3, GFR 30-59 ml/min ([place]) 12/12/2021   Vitamin D deficiency 12/12/2021   Grief 09/05/2021   Insomnia 08/12/2021   Need for immunization against influenza 08/11/2021   Overactive bladder 10/11/2020   Anxiety 09/13/2020   Tongue pain 04/22/2020   Gastroesophageal reflux disease 03/03/2020   Laryngopharyngeal reflux (LPR) 03/03/2020   Depression, major, single episode, moderate ([place]) 12/02/2019   Controlled diabetes mellitus type 2 with complications ([place]) 23/53/6144   Hyperlipidemia 12/02/2019   Essential  hypertension 12/02/2019   Lumbar radiculopathy 10/31/2019   Polyarthropathy 10/31/2019   Polyneuropathy due to type 2 diabetes mellitus ([place]) 10/31/2019   Chronic pain syndrome 10/31/2019   Bilateral sensorineural hearing loss 02/11/2017   Right ear impacted cerumen 02/11/2017   Sarcoidosis 03/02/2014   Low back pain 03/02/2014   Past Medical History:  Diagnosis Date   Arthritis    Coronary artery disease    Diabetes mellitus without complication ([place])    Encephalopathy 03/02/2014   Hyperlipidemia    Hypertension    Osteoporosis    Referred otalgia of right ear 02/11/2017   Renal disorder    Sarcoidosis    Throat pain 03/03/2020   Past Surgical History:  Procedure Laterality Date   ANKLE SURGERY     BACK SURGERY     FRACTURE SURGERY     tibia    Social History   Tobacco Use   Smoking status: Never   Smokeless tobacco: Never  Vaping Use   Vaping Use: Never used  Substance Use Topics   Alcohol use: No   Drug use: No   Social History   Socioeconomic History   Marital status: Married    Spouse name: Ronnie    Number of children: 3   Years of education: 12   Highest education level: Master's degree (e.g., MA, MS, MEng, MEd, MSW, MBA)  Occupational History   Not on file  Tobacco Use   Smoking status: Never   Smokeless tobacco: Never  Vaping Use   Vaping Use: Never used  Substance and Sexual Activity   Alcohol use: No   Drug use: No   Sexual activity: Not Currently  Other Topics Concern   Not on file  Social History Narrative   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)   Lives with husband-Ronnie    Has 3 grown children: two live in town and one out of town   10 grandbabies   8 great grandbabies   Enjoys: reading, talk with friends, church-sing in choir       Diet: eats all food groups   Caffeine: none     Water: 4 cups daily      Wears seat belt   Does not use phone while driving   Oceanographer at home   Social Determinants of Health   Financial  Resource Strain: Low Risk  (09/26/2021)   Overall Financial Resource Strain (CARDIA)    Difficulty of Paying Living Expenses: Not hard at all  Food Insecurity: No Food Insecurity (09/26/2021)   Hunger Vital Sign    Worried About Running Out of Food in the Last Year: Never true    [place] in the Last Year: Never true  Transportation Needs: No Transportation Needs (09/26/2021)   PRAPARE - Hydrologist (Medical): No    Lack of Transportation (Non-Medical): No  Physical Activity: Inactive (11/10/2021)   Exercise Vital Sign    Days of Exercise per Week: 0 days    Minutes of Exercise per Session: 0 min  Stress: Stress Concern Present (02/12/2022)   [place]    Feeling of Stress : To some extent  Social Connections: Moderately Isolated (09/26/2021)   Social Connection and Isolation Panel [NHANES]    Frequency of Communication with Friends and Family: More than three times a week    Frequency of Social Gatherings with Friends and Family: Once a week    Attends Religious Services: Never    Marine scientist or Organizations: No    Attends Archivist Meetings: Never    Marital Status: Married  Human resources officer Violence: Not At Risk (09/26/2021)   Humiliation, Afraid, Rape, and Kick questionnaire    Fear of Current or Ex-Partner: No    Emotionally Abused: No    Physically Abused: No    Sexually Abused: No   Family Status  Relation Name Status   Mother  Deceased   Father  Deceased   Sister  Alive   Son  Alive   Son youngest 57   Family History  Problem Relation Age of Onset   Hypertension Mother    Hyperlipidemia Mother    Throat cancer Father    Early death Son    Allergies  Allergen Reactions   Darvon [Propoxyphene] Nausea And Vomiting   Morphine And Related Itching and Rash   Penicillins Itching and Rash    Pt denies this allergy      Patient Care  Team: Alvira Monday, FNP as PCP - General (Family Medicine) Shea Evans, Norva Riffle, LCSW as [facility] (Licensed Clinical Social Worker)   Outpatient Medications Prior to Visit  Medication Sig   albuterol (VENTOLIN HFA) 108 (90 Base) MCG/ACT inhaler Inhale 1-2 puffs into the lungs every 6 (six) hours as needed for wheezing or  shortness of breath.   alendronate (FOSAMAX) 70 MG tablet Take 1 tablet (70 mg total) by mouth once a week. Take with a full glass of water on an empty stomach.   busPIRone (BUSPAR) 7.5 MG tablet TAKE (1) TABLET BY MOUTH TWICE DAILY.   doxepin (SINEQUAN) 10 MG/ML solution Take 10 mg by mouth in the morning and at bedtime. Patient takes one dose (47m) up to twice daily   DULoxetine (CYMBALTA) 30 MG capsule Take 30 mg by mouth 2 (two) times daily.   ezetimibe (ZETIA) 10 MG tablet Take 1 tablet (10 mg total) by mouth daily.   furosemide (LASIX) 40 MG tablet Take 1 tablet (40 mg total) by mouth daily.   glipiZIDE (GLUCOTROL XL) 5 MG 24 hr tablet Take 5 mg by mouth daily with breakfast.   hydroxychloroquine (PLAQUENIL) 200 MG tablet TAKE ONE TABLET BY MOUTH ONCE DAILY.   lisinopril (ZESTRIL) 2.5 MG tablet TAKE ONE TABLET BY MOUTH ONCE DAILY FOR BLOOD PRESSURE.   methocarbamol (ROBAXIN) 500 MG tablet Take 500 mg by mouth 4 (four) times daily.   oxybutynin (DITROPAN) 5 MG tablet TAKE (1) TABLET BY MOUTH (3) TIMES DAILY.   oxyCODONE-acetaminophen (PERCOCET) 10-325 MG tablet Take 1 tablet by mouth every 6 (six) hours as needed for pain.   pantoprazole (PROTONIX) 40 MG tablet Take 1 tablet (40 mg total) by mouth 2 (two) times daily.   potassium chloride SA (KLOR-CON M) 20 MEQ tablet Take 1 tablet (20 mEq total) by mouth daily.   pregabalin (LYRICA) 100 MG capsule Take 100-200 mg by mouth See admin instructions. 1086mtaken in the morning and 20046mt bedtime   sertraline (ZOLOFT) 100 MG tablet TAKE 1 1/2 TABLETS (150MG) BY MOUTH DAILY   simvastatin  (ZOCOR) 40 MG tablet TAKE (1) TABLET BY MOUTH AT BEDTIME.   sodium bicarbonate 650 MG tablet TAKE (1) TABLET BY MOUTH TWICE DAILY.   Vitamin D, Ergocalciferol, (DRISDOL) 1.25 MG (50000 UNIT) CAPS capsule TAKE 1 CAPSULE THE SAME DAY EACH WEEK.   No facility-administered medications prior to visit.    Review of Systems  Constitutional:  Negative for fever.  HENT:  Positive for congestion.   Eyes:  Negative for pain and discharge.  Respiratory:  Negative for cough, sputum production and shortness of breath.   Cardiovascular:  Negative for chest pain.  Gastrointestinal:  Negative for nausea and vomiting.  Genitourinary:  Negative for hematuria and urgency.  Musculoskeletal:  Negative for neck pain.  Skin:  Negative for itching.  Neurological:  Negative for dizziness and tremors.  Endo/Heme/Allergies:  Negative for polydipsia.  Psychiatric/Behavioral:  Negative for memory loss.           Objective:     BP 138/83   Pulse 94   Ht _0  (1.727 m)   Wt 177 lb (80.3 kg)   SpO2 96%   BMI 26.91 kg/m  BP Readings from Last 3 Encounters:  07/04/22 138/83  12/12/21 130/86  08/11/21 134/78   Wt Readings from Last 3 Encounters:  07/04/22 177 lb (80.3 kg)  12/12/21 177 lb (80.3 kg)  08/11/21 180 lb (81.6 kg)      Physical Exam HENT:     Head: Normocephalic.     Right Ear: External ear normal.     Left Ear: External ear normal.     Nose: No congestion.     Mouth/Throat:     Mouth: Mucous membranes are moist.  Eyes:     Extraocular Movements: Extraocular  movements intact.     Pupils: Pupils are equal, round, and reactive to light.  Cardiovascular:     Rate and Rhythm: Normal rate and regular rhythm.     Heart sounds: Normal heart sounds.  Pulmonary:     Effort: Pulmonary effort is normal.     Breath sounds: Normal breath sounds. No rhonchi.  Abdominal:     Tenderness: There is no abdominal tenderness.  Musculoskeletal:     Cervical back: No rigidity.     Right lower  leg: No edema.     Left lower leg: No edema.     Comments: Genu valgum of the knees  Skin:    Findings: No lesion or rash.  Neurological:     Mental Status: She is alert and oriented to person, place, and time.     GCS: GCS eye subscore is 4. GCS verbal subscore is 5. GCS motor subscore is 6.     Sensory: Sensation is intact.     Motor: No pronator drift.     Coordination: Coordination normal. Finger-Nose-Finger Test normal.     Gait: Gait abnormal.      No results found for any visits on 07/04/22. Last CBC Lab Results  Component Value Date   WBC 3.4 08/11/2021   HGB 12.5 08/11/2021   HCT 38.3 08/11/2021   MCV 88 08/11/2021   MCH 28.6 08/11/2021   RDW 14.7 08/11/2021   PLT 209 17/51/0258   Last metabolic panel Lab Results  Component Value Date   GLUCOSE 129 (H) 04/18/2022   NA 144 04/18/2022   K 4.5 04/18/2022   CL 103 04/18/2022   CO2 26 04/18/2022   BUN 12 04/18/2022   CREATININE 1.08 (H) 04/18/2022   EGFR 54 (L) 04/18/2022   CALCIUM 9.6 04/18/2022   PHOS 2.8 02/28/2011   PROT 6.6 04/18/2022   ALBUMIN 4.0 04/18/2022   LABGLOB 2.6 04/18/2022   AGRATIO 1.5 04/18/2022   BILITOT 0.5 04/18/2022   ALKPHOS 54 04/18/2022   AST 14 04/18/2022   ALT 7 04/18/2022   ANIONGAP 9 05/06/2021   Last lipids Lab Results  Component Value Date   CHOL 192 04/18/2022   HDL 60 04/18/2022   LDLCALC 107 (H) 04/18/2022   TRIG 143 04/18/2022   CHOLHDL 3.2 04/18/2022   Last hemoglobin A1c Lab Results  Component Value Date   HGBA1C 5.8 (H) 04/18/2022   Last thyroid functions Lab Results  Component Value Date   TSH 3.19 07/03/2018   Last vitamin D Lab Results  Component Value Date   VD25OH 36.2 04/18/2022   Last vitamin B12 and Folate Lab Results  Component Value Date   VITAMINB12 >2,000 (H) 03/04/2014        Assessment & Plan:    Routine Health Maintenance and Physical Exam  Immunization History  Administered Date(s) Administered   Fluad Quad(high Dose 65+)  07/27/2020, 08/11/2021, 07/04/2022   Influenza-Unspecified 08/02/2016, 06/26/2017, 07/03/2018   Moderna Sars-Covid-2 Vaccination 10/27/2019, 11/24/2019   Pneumococcal Conjugate-13 12/29/2013   Pneumococcal Polysaccharide-23 08/02/2016   Zoster Recombinat (Shingrix) 07/03/2018   Zoster, Live 06/15/2011    Health Maintenance  Topic Date Due   Diabetic kidney evaluation - Urine ACR  Never done   TETANUS/TDAP  Never done   Fecal DNA (Cologuard)  Never done   Zoster Vaccines- Shingrix (2 of 2) 08/28/2018   COVID-19 Vaccine (3 - Moderna series) 01/19/2020   OPHTHALMOLOGY EXAM  02/21/2022   FOOT EXAM  08/11/2022   Medicare Annual Wellness (  AWV)  09/26/2022   HEMOGLOBIN A1C  10/19/2022   Diabetic kidney evaluation - GFR measurement  04/19/2023   MAMMOGRAM  07/06/2023   Pneumonia Vaccine 99+ Years old  Completed   INFLUENZA VACCINE  Completed   DEXA SCAN  Completed   Hepatitis C Screening  Completed   HPV VACCINES  Aged Out    Discussed health benefits of physical activity, and encouraged her to engage in regular exercise appropriate for her age and condition.  Problem List Items Addressed This Visit       Respiratory   Sinus infection    Will treat with azelastine nasal spray Encouraged to use a humidifier at home to open up her nasal passages      Relevant Medications   azelastine (ASTELIN) 0.1 % nasal spray     Other   Need for immunization against influenza    Patient educated on CDC recommendation for the vaccine. Verbal consent was obtained from the patient, vaccine administered by nurse, no sign of adverse reactions noted at this time. Patient education on arm soreness and use of tylenol or ibuprofen for this patient  was discussed. Patient educated on the signs and symptoms of adverse effect and advise to contact the office if they occur.       Encounter for general adult medical examination with abnormal findings - Primary    Physical exam as documented Counseling is  done on healthy lifestyle involving commitment to 150 minutes of exercise per week,  Discussed heart-healthy diet  Changes in health habits are decided on by the patient with goals and time frames set for achieving them. Immunization and cancer screening needs are specifically addressed at this visit Encourage patient to get her Tdap vaccination at her local pharmacy           Other Visit Diagnoses     Flu vaccine need       Relevant Orders   Flu Vaccine QUAD High Dose(Fluad) (Completed)   Colon cancer screening       Relevant Orders   Fecal occult blood, imunochemical      Return in about 2 months (around 09/03/2022).     Alvira Monday, FNP

## 2022-07-04 NOTE — Assessment & Plan Note (Signed)
Physical exam as documented Counseling is done on healthy lifestyle involving commitment to 150 minutes of exercise per week,  Discussed heart-healthy diet  Changes in health habits are decided on by the patient with goals and time frames set for achieving them. Immunization and cancer screening needs are specifically addressed at this visit Encourage patient to get her Tdap vaccination at her local pharmacy

## 2022-07-04 NOTE — Assessment & Plan Note (Signed)
Will treat with azelastine nasal spray Encouraged to use a humidifier at home to open up her nasal passages

## 2022-07-04 NOTE — Assessment & Plan Note (Signed)
Patient educated on CDC recommendation for the vaccine. Verbal consent was obtained from the patient, vaccine administered by nurse, no sign of adverse reactions noted at this time. Patient education on arm soreness and use of tylenol or ibuprofen for this patient  was discussed. Patient educated on the signs and symptoms of adverse effect and advise to contact the office if they occur.  

## 2022-07-13 ENCOUNTER — Other Ambulatory Visit: Payer: Self-pay | Admitting: Nurse Practitioner

## 2022-07-18 ENCOUNTER — Other Ambulatory Visit: Payer: Self-pay | Admitting: Nurse Practitioner

## 2022-07-20 ENCOUNTER — Other Ambulatory Visit: Payer: Self-pay | Admitting: Internal Medicine

## 2022-07-20 ENCOUNTER — Other Ambulatory Visit: Payer: Self-pay | Admitting: Nurse Practitioner

## 2022-07-23 ENCOUNTER — Other Ambulatory Visit: Payer: Self-pay | Admitting: Nurse Practitioner

## 2022-07-26 ENCOUNTER — Other Ambulatory Visit: Payer: Self-pay | Admitting: Nurse Practitioner

## 2022-07-30 NOTE — Telephone Encounter (Signed)
yes

## 2022-08-13 ENCOUNTER — Other Ambulatory Visit: Payer: Self-pay | Admitting: Nurse Practitioner

## 2022-08-28 ENCOUNTER — Other Ambulatory Visit: Payer: Self-pay | Admitting: Family Medicine

## 2022-08-28 DIAGNOSIS — I10 Essential (primary) hypertension: Secondary | ICD-10-CM

## 2022-08-28 DIAGNOSIS — N3281 Overactive bladder: Secondary | ICD-10-CM

## 2022-08-29 ENCOUNTER — Other Ambulatory Visit: Payer: Self-pay | Admitting: Family Medicine

## 2022-08-29 ENCOUNTER — Other Ambulatory Visit: Payer: Self-pay | Admitting: Nurse Practitioner

## 2022-08-29 DIAGNOSIS — K219 Gastro-esophageal reflux disease without esophagitis: Secondary | ICD-10-CM

## 2022-09-03 ENCOUNTER — Ambulatory Visit: Payer: HMO | Admitting: Family Medicine

## 2022-09-04 ENCOUNTER — Ambulatory Visit: Payer: HMO | Admitting: Family Medicine

## 2022-09-04 ENCOUNTER — Other Ambulatory Visit: Payer: Self-pay

## 2022-09-04 DIAGNOSIS — F419 Anxiety disorder, unspecified: Secondary | ICD-10-CM

## 2022-09-04 MED ORDER — BUSPIRONE HCL 7.5 MG PO TABS: 7.5000 mg | ORAL_TABLET | Freq: Two times a day (BID) | ORAL | 1 refills | Status: DC

## 2022-09-05 ENCOUNTER — Ambulatory Visit (INDEPENDENT_AMBULATORY_CARE_PROVIDER_SITE_OTHER): Payer: PPO | Admitting: Family Medicine

## 2022-09-05 ENCOUNTER — Encounter: Payer: Self-pay | Admitting: Family Medicine

## 2022-09-05 VITALS — BP 128/85 | HR 101 | Ht 68.0 in | Wt 173.1 lb

## 2022-09-05 DIAGNOSIS — K219 Gastro-esophageal reflux disease without esophagitis: Secondary | ICD-10-CM | POA: Diagnosis not present

## 2022-09-05 DIAGNOSIS — G894 Chronic pain syndrome: Secondary | ICD-10-CM

## 2022-09-05 DIAGNOSIS — B349 Viral infection, unspecified: Secondary | ICD-10-CM

## 2022-09-05 DIAGNOSIS — Z1211 Encounter for screening for malignant neoplasm of colon: Secondary | ICD-10-CM

## 2022-09-05 DIAGNOSIS — E559 Vitamin D deficiency, unspecified: Secondary | ICD-10-CM | POA: Diagnosis not present

## 2022-09-05 DIAGNOSIS — E782 Mixed hyperlipidemia: Secondary | ICD-10-CM | POA: Diagnosis not present

## 2022-09-05 DIAGNOSIS — I10 Essential (primary) hypertension: Secondary | ICD-10-CM

## 2022-09-05 DIAGNOSIS — E038 Other specified hypothyroidism: Secondary | ICD-10-CM

## 2022-09-05 DIAGNOSIS — E1165 Type 2 diabetes mellitus with hyperglycemia: Secondary | ICD-10-CM | POA: Diagnosis not present

## 2022-09-05 MED ORDER — ESOMEPRAZOLE MAGNESIUM 20 MG PO CPDR: 20.0000 mg | DELAYED_RELEASE_CAPSULE | Freq: Every day | ORAL | 0 refills | Status: DC

## 2022-09-05 MED ORDER — PROMETHAZINE-DM 6.25-15 MG/5ML PO SYRP: 5.0000 mL | ORAL_SOLUTION | Freq: Four times a day (QID) | ORAL | 0 refills | Status: DC | PRN

## 2022-09-05 MED ORDER — LANSOPRAZOLE 15 MG PO CPDR: 15.0000 mg | DELAYED_RELEASE_CAPSULE | Freq: Every day | ORAL | 1 refills | Status: DC

## 2022-09-05 MED ORDER — OXYCODONE-ACETAMINOPHEN 10-325 MG PO TABS: 1.0000 | ORAL_TABLET | Freq: Four times a day (QID) | ORAL | 0 refills | Status: DC | PRN

## 2022-09-05 NOTE — Assessment & Plan Note (Signed)
She was seeing [place] neurology for chronic pain syndrome She reports that [facility] does not accept her insurance She has been trying to establish care with [facility] in [place] but has been unable to scheduled an appt We will provide a short supply of narcotics today until she can establish care with pain management

## 2022-09-05 NOTE — Assessment & Plan Note (Signed)
She reports minimal relief of her symptoms with Protonix 40 mg daily We will start patient on Nexium 20 mg daily

## 2022-09-05 NOTE — Assessment & Plan Note (Signed)
Controlled She takes lisinopril 2.5 mg daily She reports compliance with treatment regiment Will assess fluid status CMP today BP Readings from Last 3 Encounters:  09/05/22 128/85  07/04/22 138/83  12/12/21 130/86

## 2022-09-05 NOTE — Progress Notes (Signed)
Established Patient Office Visit  Subjective:  Patient ID: [name], female    DOB: 1945-09-06  Age: 77 y.o. MRN: [id number]  CC:  Chief Complaint  Patient presents with   Follow-up    2 month f/u, pt reports right ear pain and headache.     HPI [name] is a 77 y.o. female with past medical history of essential hypertension, GERD, and type 2 diabetes  for f/u of  chronic medical conditions. For the details of today's visit, please refer to the assessment and plan.       Past Medical History:  Diagnosis Date   Arthritis    Coronary artery disease    Diabetes mellitus without complication (HCC)    Encephalopathy 03/02/2014   Hyperlipidemia    Hypertension    Osteoporosis    Referred otalgia of right ear 02/11/2017   Renal disorder    Sarcoidosis    Throat pain 03/03/2020    Past Surgical History:  Procedure Laterality Date   ANKLE SURGERY     BACK SURGERY     FRACTURE SURGERY     tibia     Family History  Problem Relation Age of Onset   Hypertension Mother    Hyperlipidemia Mother    Throat cancer Father    Early death Son     Social History   Socioeconomic History   Marital status: Married    Spouse name: Ronnie    Number of children: 3   Years of education: 12   Highest education level: Master's degree (e.g., MA, MS, MEng, MEd, MSW, MBA)  Occupational History   Not on file  Tobacco Use   Smoking status: Never   Smokeless tobacco: Never  Vaping Use   Vaping Use: Never used  Substance and Sexual Activity   Alcohol use: No   Drug use: No   Sexual activity: Not Currently  Other Topics Concern   Not on file  Social History Narrative   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)   Lives with husband-Ronnie    Has 3 grown children: two live in town and one out of town   10 grandbabies   8 great grandbabies   Enjoys: reading, talk with friends, church-sing in choir       Diet: eats all food groups   Caffeine: none     Water: 4 cups daily       Wears seat belt   Does not use phone while driving   Psychologist, sport and exercise at home   Social Determinants of Health   Financial Resource Strain: Low Risk  (09/26/2021)   Overall Financial Resource Strain (CARDIA)    Difficulty of Paying Living Expenses: Not hard at all  Food Insecurity: No Food Insecurity (09/26/2021)   Hunger Vital Sign    Worried About Running Out of Food in the Last Year: Never true    Ran Out of Food in the Last Year: Never true  Transportation Needs: No Transportation Needs (09/26/2021)   PRAPARE - Administrator, Civil Service (Medical): No    Lack of Transportation (Non-Medical): No  Physical Activity: Inactive (11/10/2021)   Exercise Vital Sign    Days of Exercise per Week: 0 days    Minutes of Exercise per Session: 0 min  Stress: Stress Concern Present (02/12/2022)   [employer] of Occupational Health - Occupational Stress Questionnaire    Feeling of Stress : To some extent  Social Connections: Moderately Isolated (09/26/2021)  Social Advertising account executive [NHANES]    Frequency of Communication with Friends and Family: More than three times a week    Frequency of Social Gatherings with Friends and Family: Once a week    Attends Religious Services: Never    Database administrator or Organizations: No    Attends Banker Meetings: Never    Marital Status: Married  Catering manager Violence: Not At Risk (09/26/2021)   Humiliation, Afraid, Rape, and Kick questionnaire    Fear of Current or Ex-Partner: No    Emotionally Abused: No    Physically Abused: No    Sexually Abused: No    Outpatient Medications Prior to Visit  Medication Sig Dispense Refill   albuterol (VENTOLIN HFA) 108 (90 Base) MCG/ACT inhaler Inhale 1-2 puffs into the lungs every 6 (six) hours as needed for wheezing or shortness of breath. 18 g 0   alendronate (FOSAMAX) 70 MG tablet Take 1 tablet (70 mg total) by mouth once a week. Take with a full glass of water on  an empty stomach. 4 tablet 11   azelastine (ASTELIN) 0.1 % nasal spray Place 2 sprays into both nostrils 2 (two) times daily. Use in each nostril as directed 30 mL 12   busPIRone (BUSPAR) 7.5 MG tablet Take 1 tablet (7.5 mg total) by mouth 2 (two) times daily. 60 tablet 1   doxepin (SINEQUAN) 10 MG/ML solution Take 10 mg by mouth in the morning and at bedtime. Patient takes one dose (89ml) up to twice daily     DULoxetine (CYMBALTA) 30 MG capsule Take 30 mg by mouth 2 (two) times daily.     ezetimibe (ZETIA) 10 MG tablet Take 1 tablet (10 mg total) by mouth daily. 90 tablet 0   furosemide (LASIX) 40 MG tablet TAKE ONE TABLET BY MOUTH DAILY 90 tablet 0   glipiZIDE (GLUCOTROL XL) 5 MG 24 hr tablet Take 5 mg by mouth daily with breakfast.     hydroxychloroquine (PLAQUENIL) 200 MG tablet TAKE ONE TABLET BY MOUTH ONCE DAILY. 30 tablet 0   lisinopril (ZESTRIL) 2.5 MG tablet TAKE ONE TABLET BY MOUTH ONCE DAILY FOR BLOOD PRESSURE. 90 tablet 0   methocarbamol (ROBAXIN) 500 MG tablet Take 500 mg by mouth 4 (four) times daily.     oxybutynin (DITROPAN) 5 MG tablet TAKE (1) TABLET BY MOUTH (3) TIMES DAILY. 270 tablet 0   potassium chloride SA (KLOR-CON M) 20 MEQ tablet Take 1 tablet (20 mEq total) by mouth daily. 60 tablet 3   pregabalin (LYRICA) 100 MG capsule Take 100-200 mg by mouth See admin instructions. 100mg  taken in the morning and 200mg  at bedtime     sertraline (ZOLOFT) 100 MG tablet TAKE 1 1/2 TABLETS (150MG ) BY MOUTH DAILY 135 tablet 0   simvastatin (ZOCOR) 40 MG tablet TAKE (1) TABLET BY MOUTH AT BEDTIME. 30 tablet 0   sodium bicarbonate 650 MG tablet TAKE (1) TABLET BY MOUTH TWICE DAILY. 60 tablet 0   Vitamin D, Ergocalciferol, (DRISDOL) 1.25 MG (50000 UNIT) CAPS capsule TAKE 1 CAPSULE THE SAME DAY EACH WEEK. 5 capsule 0   oxyCODONE-acetaminophen (PERCOCET) 10-325 MG tablet Take 1 tablet by mouth every 6 (six) hours as needed for pain.     pantoprazole (PROTONIX) 40 MG tablet TAKE ONE TABLET BY  MOUTH TWICE DAILY. 60 tablet 0   No facility-administered medications prior to visit.    Allergies  Allergen Reactions   Darvon [Propoxyphene] Nausea And Vomiting   Morphine  And Related Itching and Rash   Penicillins Itching and Rash    Pt denies this allergy    ROS Review of Systems  Constitutional:  Negative for chills and fever.  HENT:  Positive for congestion and ear pain. Negative for ear discharge, rhinorrhea, sinus pressure, sinus pain, sneezing and sore throat.   Eyes:  Negative for visual disturbance.  Respiratory:  Negative for chest tightness and shortness of breath.   Neurological:  Positive for headaches. Negative for dizziness.      Objective:    Physical Exam HENT:     Head: Normocephalic.     Right Ear: Tympanic membrane normal. No decreased hearing noted. There is no impacted cerumen.     Left Ear: Tympanic membrane normal. No decreased hearing noted. There is no impacted cerumen.     Mouth/Throat:     Mouth: Mucous membranes are moist.  Cardiovascular:     Rate and Rhythm: Normal rate.     Heart sounds: Normal heart sounds.  Pulmonary:     Effort: Pulmonary effort is normal.     Breath sounds: Normal breath sounds.  Neurological:     Mental Status: She is alert.     BP 128/85   Pulse (!) 101   Ht 5\' 8"  (1.727 m)   Wt 173 lb 1.9 oz (78.5 kg)   SpO2 93%   BMI 26.32 kg/m  Wt Readings from Last 3 Encounters:  09/05/22 173 lb 1.9 oz (78.5 kg)  07/04/22 177 lb (80.3 kg)  12/12/21 177 lb (80.3 kg)    Lab Results  Component Value Date   TSH 3.19 07/03/2018   Lab Results  Component Value Date   WBC 3.4 08/11/2021   HGB 12.5 08/11/2021   HCT 38.3 08/11/2021   MCV 88 08/11/2021   PLT 209 08/11/2021   Lab Results  Component Value Date   NA 144 04/18/2022   K 4.5 04/18/2022   CO2 26 04/18/2022   GLUCOSE 129 (H) 04/18/2022   BUN 12 04/18/2022   CREATININE 1.08 (H) 04/18/2022   BILITOT 0.5 04/18/2022   ALKPHOS 54 04/18/2022   AST 14  04/18/2022   ALT 7 04/18/2022   PROT 6.6 04/18/2022   ALBUMIN 4.0 04/18/2022   CALCIUM 9.6 04/18/2022   ANIONGAP 9 05/06/2021   EGFR 54 (L) 04/18/2022   Lab Results  Component Value Date   CHOL 192 04/18/2022   Lab Results  Component Value Date   HDL 60 04/18/2022   Lab Results  Component Value Date   LDLCALC 107 (H) 04/18/2022   Lab Results  Component Value Date   TRIG 143 04/18/2022   Lab Results  Component Value Date   CHOLHDL 3.2 04/18/2022   Lab Results  Component Value Date   HGBA1C 5.8 (H) 04/18/2022      Assessment & Plan:  Essential hypertension Assessment & Plan: Controlled She takes lisinopril 2.5 mg daily She reports compliance with treatment regiment Will assess fluid status CMP today BP Readings from Last 3 Encounters:  09/05/22 128/85  07/04/22 138/83  12/12/21 130/86      Gastroesophageal reflux disease without esophagitis Assessment & Plan: She reports minimal relief of her symptoms with Protonix 40 mg daily We will start patient on Nexium 20 mg daily  Orders: -     Esomeprazole Magnesium; Take 1 capsule (20 mg total) by mouth daily at 2 PM.  Dispense: 30 capsule; Refill: 0  Mixed hyperlipidemia Assessment & Plan: She takes simvastatin 40 mg  daily and acetamide 10 mg daily Denies muscle aches and pain Will assess lipid panel today  Orders: -     Lipid panel -     CMP14+EGFR  Viral illness Assessment & Plan: She complains of of nasal congestion, ear ache, and headache Likely due to viral sinusitis She denies fever, sore throat, and cough Will treat with Promethazine DM She reports minimal relief of her congestion with azelastine nasal spray Encouraged to use heated humidifier to open up her sinuses    Orders: -     Promethazine-DM; Take 5 mLs by mouth 4 (four) times daily as needed for cough.  Dispense: 118 mL; Refill: 0  Chronic pain syndrome Assessment & Plan: She was seeing [place] neurolog[facility] for chronic pain  syndrome She reports that [place] [facility] does not accept her insurance She has been trying to establish care with [facility] in [place] but has been unable to scheduled an appt We will provide a short supply of narcotics today until she can establish care with pain management  Orders: -     oxyCODONE-Acetaminophen; Take 1 tablet by mouth every 6 (six) hours as needed for pain.  Dispense: 30 tablet; Refill: 0  Colon cancer screening  Vitamin D deficiency -     VITAMIN D 25 Hydroxy (Vit-D Deficiency, Fractures)  Uncontrolled type 2 diabetes mellitus with hyperglycemia (HCC) -     Hemoglobin A1c -     CBC with Differential/Platelet -     Microalbumin / creatinine urine ratio -     HM Diabetes Foot Exam  Other specified hypothyroidism -     TSH + free T4    Follow-up: Return in about 1 month (around 10/06/2022) for GERD.   Alvira Monday, FNP

## 2022-09-05 NOTE — Assessment & Plan Note (Signed)
She takes simvastatin 40 mg daily and acetamide 10 mg daily Denies muscle aches and pain Will assess lipid panel today

## 2022-09-05 NOTE — Assessment & Plan Note (Signed)
She complains of of nasal congestion, ear ache, and headache Likely due to viral sinusitis She denies fever, sore throat, and cough Will treat with Promethazine DM She reports minimal relief of her congestion with azelastine nasal spray Encouraged to use heated humidifier to open up her sinuses

## 2022-09-05 NOTE — Patient Instructions (Addendum)
I appreciate the opportunity to provide care to you today!    Follow up:  1 months  Labs: please stop by the lab today to get your blood drawn (CBC, CMP, TSH, Lipid profile, HgA1c, Vit D)  Please pick up your medications at the pharmacy Take medication as prescribed. Increase fluids and allow for plenty of rest. Recommend Tylenol or ibuprofen as needed for pain, fever, or general discomfort. Recommend using a humidifier at bedtime during sleep to help with cough and nasal congestion. Follow-up if your symptoms do not improve     Please complete Colon Screening Test (FIT TEST) , Drop it off when completed.  Please schedule your Annual Eye Exam: [facility] [phone]    Please continue to a heart-healthy diet and increase your physical activities. Try to exercise for 2mins at least five times a week.      It was a pleasure to see you and I look forward to continuing to work together on your health and well-being. Please do not hesitate to call the office if you need care or have questions about your care.   Have a wonderful day and week. With Gratitude, Alvira Monday MSN, FNP-BC

## 2022-09-11 ENCOUNTER — Other Ambulatory Visit: Payer: Self-pay | Admitting: Nurse Practitioner

## 2022-09-12 ENCOUNTER — Other Ambulatory Visit: Payer: Self-pay | Admitting: Family Medicine

## 2022-09-12 DIAGNOSIS — B349 Viral infection, unspecified: Secondary | ICD-10-CM

## 2022-09-12 DIAGNOSIS — G894 Chronic pain syndrome: Secondary | ICD-10-CM

## 2022-09-13 ENCOUNTER — Other Ambulatory Visit: Payer: Self-pay | Admitting: Family Medicine

## 2022-09-13 DIAGNOSIS — G894 Chronic pain syndrome: Secondary | ICD-10-CM

## 2022-09-14 ENCOUNTER — Other Ambulatory Visit: Payer: Self-pay | Admitting: Family Medicine

## 2022-09-14 ENCOUNTER — Telehealth: Payer: Self-pay | Admitting: Family Medicine

## 2022-09-14 DIAGNOSIS — B349 Viral infection, unspecified: Secondary | ICD-10-CM

## 2022-09-14 DIAGNOSIS — G894 Chronic pain syndrome: Secondary | ICD-10-CM

## 2022-09-14 NOTE — Telephone Encounter (Signed)
Please follow-up on the referral placed to pain management

## 2022-09-14 NOTE — Telephone Encounter (Signed)
Patient needs refill on   promethazine-dextromethorphan (PROMETHAZINE-DM) 6.25-15 MG/5ML syrup    oxyCODONE-acetaminophen (PERCOCET) 10-325 MG tablet   Patient wants a call back , may not need to have promethazine-dextromethorphan (PROMETHAZINE-DM) 6.25-15 MG/5ML syrup  refilled

## 2022-09-14 NOTE — Telephone Encounter (Signed)
Patient called in regard to previous tele message. Patient has not heard anything about refill and doers not want to go into next week with out meds.  Pt will not be able to see neuro until 2/21 will need to have  oxyCODONE-acetaminophen (PERCOCET) 10-325 MG tablet  Prescribed until then .  Wants a call back in regard

## 2022-09-17 ENCOUNTER — Other Ambulatory Visit: Payer: Self-pay

## 2022-09-17 ENCOUNTER — Other Ambulatory Visit: Payer: Self-pay | Admitting: Family Medicine

## 2022-09-17 DIAGNOSIS — B349 Viral infection, unspecified: Secondary | ICD-10-CM

## 2022-09-17 MED ORDER — PROMETHAZINE-DM 6.25-15 MG/5ML PO SYRP: 5.0000 mL | ORAL_SOLUTION | Freq: Four times a day (QID) | ORAL | 0 refills | Status: DC | PRN

## 2022-09-17 NOTE — Telephone Encounter (Signed)
Patient returning  call to nurse asked if nurse would return her call about her pain management. Needs to know exactly where to go [phone].

## 2022-09-17 NOTE — Telephone Encounter (Signed)
Spoke to pt let her know we are looking to find another pain management clinic to refer her too, states she has ran out of pain medication and is needing a refill until we can find another pain clinic referral. Please advice on refill? I did try to call [facility] was not able to get through.

## 2022-09-17 NOTE — Telephone Encounter (Signed)
Yes please

## 2022-09-17 NOTE — Telephone Encounter (Signed)
Spoke to pt states she has not received a call from [facility], still awaiting a call to schedule pain appt, I suggested her to call over there today, states she has ran out of pain medication and would like a refill until she can get in to pain clinic. States she did get a call from [facility] and they scheduled her for 10/17/2022

## 2022-09-18 ENCOUNTER — Other Ambulatory Visit: Payer: Self-pay | Admitting: Family Medicine

## 2022-09-19 ENCOUNTER — Other Ambulatory Visit: Payer: Self-pay | Admitting: Nurse Practitioner

## 2022-09-19 ENCOUNTER — Telehealth: Payer: Self-pay | Admitting: Family Medicine

## 2022-09-19 ENCOUNTER — Other Ambulatory Visit: Payer: Self-pay | Admitting: Family Medicine

## 2022-09-19 NOTE — Telephone Encounter (Signed)
Pt wants to switch to Dr. Doren Custard. She was seen by Kristin Bruins & changed to [place]. She's does not want to see a NP anymore she wants to see MD. She wants to know if you can please call her to let her know if this is possible?

## 2022-09-20 DIAGNOSIS — G894 Chronic pain syndrome: Secondary | ICD-10-CM | POA: Diagnosis not present

## 2022-09-20 DIAGNOSIS — M961 Postlaminectomy syndrome, not elsewhere classified: Secondary | ICD-10-CM | POA: Diagnosis not present

## 2022-09-20 DIAGNOSIS — Z79891 Long term (current) use of opiate analgesic: Secondary | ICD-10-CM | POA: Diagnosis not present

## 2022-09-20 DIAGNOSIS — G890 Central pain syndrome: Secondary | ICD-10-CM | POA: Diagnosis not present

## 2022-09-20 DIAGNOSIS — M25579 Pain in unspecified ankle and joints of unspecified foot: Secondary | ICD-10-CM | POA: Diagnosis not present

## 2022-09-20 DIAGNOSIS — M545 Low back pain, unspecified: Secondary | ICD-10-CM | POA: Diagnosis not present

## 2022-09-20 DIAGNOSIS — M546 Pain in thoracic spine: Secondary | ICD-10-CM | POA: Diagnosis not present

## 2022-09-21 ENCOUNTER — Other Ambulatory Visit: Payer: Self-pay | Admitting: Family Medicine

## 2022-09-21 DIAGNOSIS — G894 Chronic pain syndrome: Secondary | ICD-10-CM

## 2022-09-21 MED ORDER — OXYCODONE-ACETAMINOPHEN 10-325 MG PO TABS: 1.0000 | ORAL_TABLET | Freq: Four times a day (QID) | ORAL | 0 refills | Status: DC | PRN

## 2022-09-21 NOTE — Telephone Encounter (Signed)
Refills were sent to the pharmacy; please encourage the patient to follow up with her PCP, Dr. Doren Custard, for subsequent pain medication refills.

## 2022-09-24 NOTE — Telephone Encounter (Signed)
Spoke to pt let her know. 

## 2022-09-27 ENCOUNTER — Other Ambulatory Visit: Payer: Self-pay

## 2022-09-27 ENCOUNTER — Encounter: Payer: HMO | Admitting: Family Medicine

## 2022-10-02 ENCOUNTER — Other Ambulatory Visit: Payer: Self-pay | Admitting: Family Medicine

## 2022-10-02 DIAGNOSIS — K219 Gastro-esophageal reflux disease without esophagitis: Secondary | ICD-10-CM

## 2022-10-10 ENCOUNTER — Ambulatory Visit (INDEPENDENT_AMBULATORY_CARE_PROVIDER_SITE_OTHER): Payer: PPO | Admitting: Internal Medicine

## 2022-10-10 ENCOUNTER — Encounter: Payer: Self-pay | Admitting: Internal Medicine

## 2022-10-10 ENCOUNTER — Other Ambulatory Visit: Payer: Self-pay | Admitting: Family Medicine

## 2022-10-10 VITALS — BP 126/79 | HR 95 | Ht 68.0 in | Wt 179.2 lb

## 2022-10-10 DIAGNOSIS — R0981 Nasal congestion: Secondary | ICD-10-CM

## 2022-10-10 DIAGNOSIS — K219 Gastro-esophageal reflux disease without esophagitis: Secondary | ICD-10-CM | POA: Diagnosis not present

## 2022-10-10 DIAGNOSIS — F419 Anxiety disorder, unspecified: Secondary | ICD-10-CM

## 2022-10-10 MED ORDER — ESOMEPRAZOLE MAGNESIUM 20 MG PO CPDR: 20.0000 mg | DELAYED_RELEASE_CAPSULE | Freq: Every day | ORAL | 1 refills | Status: DC

## 2022-10-10 NOTE — Patient Instructions (Signed)
It was a pleasure to see you today.  Thank you for giving [place] the opportunity to be involved in your care.  Below is a brief recap of your visit and next steps.  We will plan to see you again in late April  Summary No medication changes today Continue Nexium for reflux symptoms For your sinus congestion I recommend the following: -Use a nasal saline rinse multiple times daily followed by fluticasone (Flonase) nasal spray -You can also try sudafed. -I recommend as needed use of tylenol for pain relief  We will follow up in late April

## 2022-10-10 NOTE — Progress Notes (Signed)
Established Patient Office Visit  Subjective   Patient ID: CAROL SIDNEY, female    DOB: 1945/10/14  Age: 77 y.o. MRN: [id number]  Chief Complaint  Patient presents with   Gastroesophageal Reflux    Follow up   Ms. Tumolo returns to care today for follow-up of GERD.  She was last seen at [facility] on 1/10 at which time she endorsed poorly controlled symptoms while on Protonix 40 mg daily.  She was switched to Nexium 20 mg daily.  There have been no acute interval events.  Today Ms. Kopp reports that her symptoms have improved with Nexium.  She additionally endorses right ear issues and states that she plans to see ENT (Dr. Kathlen Brunswick) later this month.  She additionally endorses sinus congestion.  Past Medical History:  Diagnosis Date   Arthritis    Coronary artery disease    Diabetes mellitus without complication (HCC)    Encephalopathy 03/02/2014   Hyperlipidemia    Hypertension    Osteoporosis    Referred otalgia of right ear 02/11/2017   Renal disorder    Sarcoidosis    Throat pain 03/03/2020   Past Surgical History:  Procedure Laterality Date   ANKLE SURGERY     BACK SURGERY     FRACTURE SURGERY     tibia    Social History   Tobacco Use   Smoking status: Never   Smokeless tobacco: Never  Vaping Use   Vaping Use: Never used  Substance Use Topics   Alcohol use: No   Drug use: No   Family History  Problem Relation Age of Onset   Hypertension Mother    Hyperlipidemia Mother    Throat cancer Father    Early death Son    Allergies  Allergen Reactions   Darvon [Propoxyphene] Nausea And Vomiting   Morphine And Related Itching and Rash   Penicillins Itching and Rash    Pt denies this allergy   Review of Systems  HENT:  Positive for congestion and ear pain (R ear discomfort).      Objective:     BP 126/79   Pulse 95   Ht 5' 8"$  (1.727 m)   Wt 179 lb 3.2 oz (81.3 kg)   SpO2 96%   BMI 27.25 kg/m  BP Readings from Last 3 Encounters:  10/10/22 126/79  09/05/22 128/85   07/04/22 138/83   Physical Exam Vitals reviewed.  Constitutional:      General: She is not in acute distress.    Appearance: Normal appearance. She is not toxic-appearing.  HENT:     Head: Normocephalic and atraumatic.     Right Ear: Tympanic membrane, ear canal and external ear normal.     Left Ear: Tympanic membrane, ear canal and external ear normal.     Nose: Congestion and rhinorrhea present.     Mouth/Throat:     Mouth: Mucous membranes are moist.     Pharynx: Oropharynx is clear. No oropharyngeal exudate or posterior oropharyngeal erythema.  Eyes:     General: No scleral icterus.    Extraocular Movements: Extraocular movements intact.     Conjunctiva/sclera: Conjunctivae normal.     Pupils: Pupils are equal, round, and reactive to light.  Cardiovascular:     Rate and Rhythm: Normal rate and regular rhythm.     Pulses: Normal pulses.     Heart sounds: Normal heart sounds. No murmur heard.    No friction rub. No gallop.  Pulmonary:     Effort:  Pulmonary effort is normal.     Breath sounds: Normal breath sounds. No wheezing, rhonchi or rales.  Abdominal:     General: Abdomen is flat. Bowel sounds are normal. There is no distension.     Palpations: Abdomen is soft.     Tenderness: There is no abdominal tenderness.  Musculoskeletal:        General: No swelling. Normal range of motion.     Cervical back: Normal range of motion.     Right lower leg: No edema.     Left lower leg: No edema.  Lymphadenopathy:     Cervical: No cervical adenopathy.  Skin:    General: Skin is warm and dry.     Capillary Refill: Capillary refill takes less than 2 seconds.     Coloration: Skin is not jaundiced.  Neurological:     General: No focal deficit present.     Mental Status: She is alert and oriented to person, place, and time.  Psychiatric:        Mood and Affect: Mood normal.        Behavior: Behavior normal.   Last CBC Lab Results  Component Value Date   WBC 3.4 08/11/2021    HGB 12.5 08/11/2021   HCT 38.3 08/11/2021   MCV 88 08/11/2021   MCH 28.6 08/11/2021   RDW 14.7 08/11/2021   PLT 209 [id number]   Last metabolic panel Lab Results  Component Value Date   GLUCOSE 129 (H) 04/18/2022   NA 144 04/18/2022   K 4.5 04/18/2022   CL 103 04/18/2022   CO2 26 04/18/2022   BUN 12 04/18/2022   CREATININE 1.08 (H) 04/18/2022   EGFR 54 (L) 04/18/2022   CALCIUM 9.6 04/18/2022   PHOS 2.8 02/28/2011   PROT 6.6 04/18/2022   ALBUMIN 4.0 04/18/2022   LABGLOB 2.6 04/18/2022   AGRATIO 1.5 04/18/2022   BILITOT 0.5 04/18/2022   ALKPHOS 54 04/18/2022   AST 14 04/18/2022   ALT 7 04/18/2022   ANIONGAP 9 05/06/2021   Last lipids Lab Results  Component Value Date   CHOL 192 04/18/2022   HDL 60 04/18/2022   LDLCALC 107 (H) 04/18/2022   TRIG 143 04/18/2022   CHOLHDL 3.2 04/18/2022   Last hemoglobin A1c Lab Results  Component Value Date   HGBA1C 5.8 (H) 04/18/2022   Last thyroid functions Lab Results  Component Value Date   TSH 3.19 07/03/2018   Last vitamin D Lab Results  Component Value Date   VD25OH 36.2 04/18/2022   Last vitamin B12 and Folate Lab Results  Component Value Date   VITAMINB12 >2,000 (H) 03/04/2014   The 10-year ASCVD risk score (Arnett DK, et al., 2019) is: 30.6%    Assessment & Plan:   Problem List Items Addressed This Visit       Sinus congestion - Primary    She currently endorses sinus congestion with clear nasal secretions.  Denies fever/chills and recent sick contacts. -I have recommended conservative treatment measures for now which include use of nasal saline rinse followed by fluticasone nasal spray, nasal decongestant use, and Tylenol as needed for pain relief. -She was instructed to return to care if she develops unilateral sinus pain/pressure, fever/chills, or if her secretions become dark yellow/green      Gastroesophageal reflux disease    Symptoms have improved with switching to Nexium 20 mg daily.  No  additional medication changes today.  Nexium has been refilled.       Return in  about 10 weeks (around 12/19/2022).    Johnette Abraham, MD

## 2022-10-11 ENCOUNTER — Other Ambulatory Visit: Payer: Self-pay | Admitting: Family Medicine

## 2022-10-11 ENCOUNTER — Other Ambulatory Visit: Payer: Self-pay | Admitting: Internal Medicine

## 2022-10-11 ENCOUNTER — Other Ambulatory Visit: Payer: Self-pay | Admitting: Nurse Practitioner

## 2022-10-11 DIAGNOSIS — K219 Gastro-esophageal reflux disease without esophagitis: Secondary | ICD-10-CM

## 2022-10-12 ENCOUNTER — Ambulatory Visit: Payer: PPO | Admitting: Family Medicine

## 2022-10-16 ENCOUNTER — Ambulatory Visit: Payer: PPO | Admitting: Family Medicine

## 2022-10-16 DIAGNOSIS — R0981 Nasal congestion: Secondary | ICD-10-CM | POA: Insufficient documentation

## 2022-10-16 NOTE — Assessment & Plan Note (Signed)
Symptoms have improved with switching to Nexium 20 mg daily.  No additional medication changes today.  Nexium has been refilled.

## 2022-10-16 NOTE — Assessment & Plan Note (Signed)
She currently endorses sinus congestion with clear nasal secretions.  Denies fever/chills and recent sick contacts. -I have recommended conservative treatment measures for now which include use of nasal saline rinse followed by fluticasone nasal spray, nasal decongestant use, and Tylenol as needed for pain relief. -She was instructed to return to care if she develops unilateral sinus pain/pressure, fever/chills, or if her secretions become dark yellow/green

## 2022-10-18 ENCOUNTER — Other Ambulatory Visit: Payer: Self-pay

## 2022-10-18 ENCOUNTER — Telehealth: Payer: Self-pay | Admitting: Internal Medicine

## 2022-10-18 NOTE — Telephone Encounter (Signed)
Pt is wanting to know if she can get a referral to another neurology/pain management. The one she was referred to is out of network. She is wanting to go to Dr. Nicholaus Bloom. They are needing her last 3 ov & insu card faxed to them. Can you please send this referral?   Dr. Nicholaus Bloom: Neurology & Pain Management

## 2022-10-22 ENCOUNTER — Other Ambulatory Visit: Payer: Self-pay | Admitting: Nurse Practitioner

## 2022-10-24 ENCOUNTER — Telehealth: Payer: Self-pay | Admitting: Internal Medicine

## 2022-10-24 NOTE — Telephone Encounter (Signed)
Patient called in wants a call back. Has been taking sudafed as told and it is not helping . Patient is having bad headaches sharp pains in ear.   Pt scheduled Friday at 8:00am. But patient wants a call back in regard

## 2022-10-25 ENCOUNTER — Other Ambulatory Visit: Payer: Self-pay | Admitting: Nurse Practitioner

## 2022-10-25 DIAGNOSIS — M961 Postlaminectomy syndrome, not elsewhere classified: Secondary | ICD-10-CM | POA: Diagnosis not present

## 2022-10-25 DIAGNOSIS — G890 Central pain syndrome: Secondary | ICD-10-CM | POA: Diagnosis not present

## 2022-10-25 DIAGNOSIS — Z79891 Long term (current) use of opiate analgesic: Secondary | ICD-10-CM | POA: Diagnosis not present

## 2022-10-25 DIAGNOSIS — G894 Chronic pain syndrome: Secondary | ICD-10-CM | POA: Diagnosis not present

## 2022-10-25 DIAGNOSIS — M545 Low back pain, unspecified: Secondary | ICD-10-CM | POA: Diagnosis not present

## 2022-10-25 DIAGNOSIS — M25579 Pain in unspecified ankle and joints of unspecified foot: Secondary | ICD-10-CM | POA: Diagnosis not present

## 2022-10-25 DIAGNOSIS — M546 Pain in thoracic spine: Secondary | ICD-10-CM | POA: Diagnosis not present

## 2022-10-26 ENCOUNTER — Ambulatory Visit (INDEPENDENT_AMBULATORY_CARE_PROVIDER_SITE_OTHER): Payer: PPO | Admitting: Internal Medicine

## 2022-10-26 ENCOUNTER — Encounter: Payer: Self-pay | Admitting: Internal Medicine

## 2022-10-26 VITALS — BP 138/78 | HR 100 | Ht 68.0 in | Wt 177.4 lb

## 2022-10-26 DIAGNOSIS — H66001 Acute suppurative otitis media without spontaneous rupture of ear drum, right ear: Secondary | ICD-10-CM | POA: Diagnosis not present

## 2022-10-26 DIAGNOSIS — J320 Chronic maxillary sinusitis: Secondary | ICD-10-CM | POA: Diagnosis not present

## 2022-10-26 DIAGNOSIS — H9311 Tinnitus, right ear: Secondary | ICD-10-CM | POA: Diagnosis not present

## 2022-10-26 DIAGNOSIS — R519 Headache, unspecified: Secondary | ICD-10-CM

## 2022-10-26 DIAGNOSIS — H9319 Tinnitus, unspecified ear: Secondary | ICD-10-CM | POA: Insufficient documentation

## 2022-10-26 MED ORDER — OFLOXACIN 0.3 % OT SOLN: 10.0000 [drp] | Freq: Two times a day (BID) | OTIC | 0 refills | Status: AC

## 2022-10-26 NOTE — Patient Instructions (Signed)
It was a pleasure to see you today.  Thank you for giving [place] the opportunity to be involved in your care.  Below is a brief recap of your visit and next steps.  We will plan to see you again in April.  Summary CT head ordered today Apply ofloxacin ear drops twice daily for the next 10 days

## 2022-10-26 NOTE — Assessment & Plan Note (Signed)
Presenting today for an acute visit to discuss symptoms of persistent tenderness in the right ear with sharp right-sided headache.  She has requested a CT scan for further evaluation given the persistence of her symptoms. -CT head with and without contrast has been ordered today -At her last appointment she reported that she was scheduled to see her ENT (Dr. Redmond Baseman) by the end of the month.  She reports that she canceled this appointment because she was not feeling well.  I have strongly encouraged her to follow-up with ENT.

## 2022-10-26 NOTE — Assessment & Plan Note (Signed)
She continues to endorse right ear discomfort today.  Bulging, erythematous TM with effusion noted on exam. -Ofloxacin otic drops x 10 days have been prescribed today

## 2022-10-26 NOTE — Progress Notes (Signed)
Acute Office Visit  Subjective:     Patient ID: [name], female    DOB: 05/20/1946, 77 y.o.   MRN: [id number]  Chief Complaint  Patient presents with   Headache    Headaches sharp pain in ear dizzy nasal congestion requesting ct scan    Ms. Kleist presents today for an acute visit to discuss persistent symptoms of her right-sided headache with right ear discomfort and tenderness.  Her symptoms have been present for multiple months.  She is tearful during her encounter today when discussing her symptoms and has requested a CT scan.  She was last evaluated by me on 2/14 at which time she endorsed symptoms of sinusitis with clear secretions.  I recommended conservative treatment measures.  She continues to endorse sinus congestion with clear nasal secretions and denies fever/chills.  Review of Systems  HENT:  Positive for congestion, ear pain, sinus pain and tinnitus.   Neurological:  Positive for headaches.        Objective:    BP 138/78 (BP Location: Left Arm, Patient Position: Sitting, Cuff Size: Large)   Pulse 100   Ht '5\' 8"'$  (1.727 m)   Wt 177 lb 6.4 oz (80.5 kg)   SpO2 97%   BMI 26.97 kg/m    Physical Exam Vitals reviewed.  Constitutional:      Appearance: Normal appearance.  HENT:     Head: Normocephalic and atraumatic.     Right Ear: External ear normal.     Left Ear: Tympanic membrane, ear canal and external ear normal.     Ears:     Comments: Erythematous, bulging TM with effusion in the right ear    Nose: Congestion present.     Mouth/Throat:     Mouth: Mucous membranes are moist.     Pharynx: Oropharynx is clear. No oropharyngeal exudate or posterior oropharyngeal erythema.  Eyes:     General: No scleral icterus.    Extraocular Movements: Extraocular movements intact.     Conjunctiva/sclera: Conjunctivae normal.     Pupils: Pupils are equal, round, and reactive to light.  Cardiovascular:     Rate and Rhythm: Normal rate and regular rhythm.   Pulmonary:     Effort: Pulmonary effort is normal.     Breath sounds: Normal breath sounds.  Abdominal:     General: Abdomen is flat. Bowel sounds are normal.     Palpations: Abdomen is soft.  Musculoskeletal:     Cervical back: Normal range of motion.  Lymphadenopathy:     Cervical: No cervical adenopathy.  Neurological:     Mental Status: She is alert.       Assessment & Plan:   Problem List Items Addressed This Visit       Acute suppurative otitis media of right ear    She continues to endorse right ear discomfort today.  Bulging, erythematous TM with effusion noted on exam. -Ofloxacin otic drops x 10 days have been prescribed today      Tinnitus    Presenting today for an acute visit to discuss symptoms of persistent tenderness in the right ear with sharp right-sided headache.  She has requested a CT scan for further evaluation given the persistence of her symptoms. -CT head with and without contrast has been ordered today -At her last appointment she reported that she was scheduled to see her ENT (Dr. Redmond Baseman) by the end of the month.  She reports that she canceled this appointment because she was not feeling  well.  I have strongly encouraged her to follow-up with ENT.      Meds ordered this encounter  Medications   ofloxacin (FLOXIN) 0.3 % OTIC solution    Sig: Place 10 drops into the right ear 2 (two) times daily for 10 days.    Dispense:  5 mL    Refill:  0    Return if symptoms worsen or fail to improve.  Johnette Abraham, MD

## 2022-11-02 ENCOUNTER — Telehealth: Payer: Self-pay | Admitting: Internal Medicine

## 2022-11-02 NOTE — Telephone Encounter (Signed)
Patient called in regard to Pain management  referral .  Patient  wants referral placed at Dr. Lanny Hurst office  [address], [place], [place].  Patient also wants a call back.

## 2022-11-05 ENCOUNTER — Other Ambulatory Visit: Payer: Self-pay | Admitting: Family Medicine

## 2022-11-05 ENCOUNTER — Encounter (HOSPITAL_BASED_OUTPATIENT_CLINIC_OR_DEPARTMENT_OTHER): Payer: Self-pay

## 2022-11-05 ENCOUNTER — Ambulatory Visit (HOSPITAL_BASED_OUTPATIENT_CLINIC_OR_DEPARTMENT_OTHER)
Admission: RE | Admit: 2022-11-05 | Discharge: 2022-11-05 | Disposition: A | Discharge status: Home / Self Care | Payer: PPO | Source: Ambulatory Visit | Attending: Internal Medicine | Admitting: Internal Medicine

## 2022-11-05 DIAGNOSIS — J320 Chronic maxillary sinusitis: Secondary | ICD-10-CM

## 2022-11-05 DIAGNOSIS — G894 Chronic pain syndrome: Secondary | ICD-10-CM

## 2022-11-05 DIAGNOSIS — H9311 Tinnitus, right ear: Secondary | ICD-10-CM

## 2022-11-05 DIAGNOSIS — R519 Headache, unspecified: Secondary | ICD-10-CM | POA: Diagnosis not present

## 2022-11-05 LAB — POCT I-STAT CREATININE: Creatinine, Ser: 1.3 mg/dL — ABNORMAL HIGH (ref 0.44–1.00)

## 2022-11-05 MED ORDER — IOHEXOL 300 MG/ML  SOLN
100.0000 mL | Freq: Once | INTRAMUSCULAR | Status: AC | PRN
  Administered 2022-11-05: 60 mL via INTRAVENOUS

## 2022-11-07 ENCOUNTER — Other Ambulatory Visit: Payer: Self-pay | Admitting: Internal Medicine

## 2022-11-09 ENCOUNTER — Other Ambulatory Visit: Payer: Self-pay | Admitting: Nurse Practitioner

## 2022-11-09 DIAGNOSIS — K219 Gastro-esophageal reflux disease without esophagitis: Secondary | ICD-10-CM

## 2022-11-12 ENCOUNTER — Other Ambulatory Visit: Payer: Self-pay | Admitting: Nurse Practitioner

## 2022-11-12 DIAGNOSIS — K219 Gastro-esophageal reflux disease without esophagitis: Secondary | ICD-10-CM

## 2022-11-14 ENCOUNTER — Telehealth: Payer: Self-pay | Admitting: Internal Medicine

## 2022-11-14 NOTE — Telephone Encounter (Signed)
Pt called in regard to recent pain management referral   Office needs 3 sets of referral notes sent in on patient .  Fax # [phone].  Patient wants a call back.

## 2022-11-14 NOTE — Telephone Encounter (Signed)
Patient states that Dr Hardin Negus needs [place] to send over notes. Tried to get in touch with office but they was done closed.

## 2022-11-19 DIAGNOSIS — G894 Chronic pain syndrome: Secondary | ICD-10-CM | POA: Diagnosis not present

## 2022-11-19 DIAGNOSIS — M5416 Radiculopathy, lumbar region: Secondary | ICD-10-CM | POA: Diagnosis not present

## 2022-11-19 DIAGNOSIS — E559 Vitamin D deficiency, unspecified: Secondary | ICD-10-CM | POA: Diagnosis not present

## 2022-11-19 DIAGNOSIS — R030 Elevated blood-pressure reading, without diagnosis of hypertension: Secondary | ICD-10-CM | POA: Diagnosis not present

## 2022-11-19 DIAGNOSIS — Z79899 Other long term (current) drug therapy: Secondary | ICD-10-CM | POA: Diagnosis not present

## 2022-11-19 DIAGNOSIS — M129 Arthropathy, unspecified: Secondary | ICD-10-CM | POA: Diagnosis not present

## 2022-11-19 DIAGNOSIS — E663 Overweight: Secondary | ICD-10-CM | POA: Diagnosis not present

## 2022-11-19 LAB — LAB REPORT - SCANNED: EGFR: 51

## 2022-11-21 ENCOUNTER — Other Ambulatory Visit: Payer: Self-pay

## 2022-11-21 DIAGNOSIS — Z1211 Encounter for screening for malignant neoplasm of colon: Secondary | ICD-10-CM

## 2022-11-27 ENCOUNTER — Other Ambulatory Visit: Payer: Self-pay | Admitting: Family Medicine

## 2022-11-27 DIAGNOSIS — N3281 Overactive bladder: Secondary | ICD-10-CM

## 2022-11-27 DIAGNOSIS — I10 Essential (primary) hypertension: Secondary | ICD-10-CM

## 2022-12-03 DIAGNOSIS — Z79899 Other long term (current) drug therapy: Secondary | ICD-10-CM | POA: Diagnosis not present

## 2022-12-03 DIAGNOSIS — E663 Overweight: Secondary | ICD-10-CM | POA: Diagnosis not present

## 2022-12-03 DIAGNOSIS — E119 Type 2 diabetes mellitus without complications: Secondary | ICD-10-CM | POA: Diagnosis not present

## 2022-12-03 DIAGNOSIS — G894 Chronic pain syndrome: Secondary | ICD-10-CM | POA: Diagnosis not present

## 2022-12-03 DIAGNOSIS — R030 Elevated blood-pressure reading, without diagnosis of hypertension: Secondary | ICD-10-CM | POA: Diagnosis not present

## 2022-12-03 DIAGNOSIS — M5416 Radiculopathy, lumbar region: Secondary | ICD-10-CM | POA: Diagnosis not present

## 2022-12-06 ENCOUNTER — Other Ambulatory Visit: Payer: Self-pay | Admitting: Internal Medicine

## 2022-12-10 ENCOUNTER — Other Ambulatory Visit: Payer: Self-pay | Admitting: Internal Medicine

## 2022-12-13 ENCOUNTER — Telehealth: Payer: Self-pay | Admitting: Internal Medicine

## 2022-12-13 ENCOUNTER — Other Ambulatory Visit: Payer: Self-pay

## 2022-12-13 DIAGNOSIS — F419 Anxiety disorder, unspecified: Secondary | ICD-10-CM

## 2022-12-13 MED ORDER — BUSPIRONE HCL 7.5 MG PO TABS: 7.5000 mg | ORAL_TABLET | Freq: Two times a day (BID) | ORAL | 0 refills | Status: DC

## 2022-12-13 NOTE — Telephone Encounter (Signed)
Pt said pharmacy has been faxing over Buspirone to be refilled and its been declined. She ran out of meds. Follow up ASAP.

## 2022-12-13 NOTE — Telephone Encounter (Signed)
Refills sent to pharmacy. 

## 2022-12-20 ENCOUNTER — Encounter: Payer: Self-pay | Admitting: Internal Medicine

## 2022-12-20 ENCOUNTER — Ambulatory Visit (INDEPENDENT_AMBULATORY_CARE_PROVIDER_SITE_OTHER): Payer: PPO | Admitting: Internal Medicine

## 2022-12-20 VITALS — BP 122/80 | HR 102 | Ht 68.0 in | Wt 172.4 lb

## 2022-12-20 DIAGNOSIS — Z1211 Encounter for screening for malignant neoplasm of colon: Secondary | ICD-10-CM | POA: Diagnosis not present

## 2022-12-20 DIAGNOSIS — E118 Type 2 diabetes mellitus with unspecified complications: Secondary | ICD-10-CM | POA: Diagnosis not present

## 2022-12-20 DIAGNOSIS — Z79899 Other long term (current) drug therapy: Secondary | ICD-10-CM | POA: Diagnosis not present

## 2022-12-20 DIAGNOSIS — I10 Essential (primary) hypertension: Secondary | ICD-10-CM | POA: Diagnosis not present

## 2022-12-20 DIAGNOSIS — G894 Chronic pain syndrome: Secondary | ICD-10-CM | POA: Diagnosis not present

## 2022-12-20 DIAGNOSIS — D869 Sarcoidosis, unspecified: Secondary | ICD-10-CM

## 2022-12-20 DIAGNOSIS — N1832 Chronic kidney disease, stage 3b: Secondary | ICD-10-CM

## 2022-12-20 DIAGNOSIS — K219 Gastro-esophageal reflux disease without esophagitis: Secondary | ICD-10-CM

## 2022-12-20 MED ORDER — EMPAGLIFLOZIN 10 MG PO TABS: 10.0000 mg | ORAL_TABLET | Freq: Every day | ORAL | 2 refills | Status: DC

## 2022-12-20 NOTE — Patient Instructions (Signed)
It was a pleasure to see you today.  Thank you for giving [place] the opportunity to be involved in your care.  Below is a brief recap of your visit and next steps.  We will plan to see you again in 3 months.  Summary Add jardiance 10 mg daily I recommend try fluticasone for your nasal congestion Please complete cologuard as previously prescribed I recommend scheduling an appointment with ENT as previously discussed I recommend calling your eye doctor to have an eye exam completed  Follow up in 3 months

## 2022-12-20 NOTE — Progress Notes (Signed)
Established Patient Office Visit  Subjective   Patient ID: [name], female    DOB: Sep 17, 1945  Age: 77 y.o. MRN: [id number]  Chief Complaint  Patient presents with   Follow-up   [name] returns to care today for routine follow-up.  She was last evaluated by me for an acute visit on 3/1 at which time she endorsed right ear discomfort.  Ofloxacin otic drops were prescribed.  She additionally endorsed persistent tinnitus and requested a CT scan for further evaluation.  I also recommended that she establish care with ENT as previously referred.  CT scan was negative for acute findings.  There have otherwise been no acute interval events.  [name] reports feeling fairly well today.  She endorses nasal congestion secondary to seasonal allergies.  She reports that she has established care with pain management at [facility].  She does not have any acute concerns to discuss today.  Past Medical History:  Diagnosis Date   Arthritis    Coronary artery disease    Diabetes mellitus without complication (HCC)    Encephalopathy 03/02/2014   Hyperlipidemia    Hypertension    Osteoporosis    Referred otalgia of right ear 02/11/2017   Renal disorder    Sarcoidosis    Throat pain 03/03/2020   Past Surgical History:  Procedure Laterality Date   ANKLE SURGERY     BACK SURGERY     FRACTURE SURGERY     tibia    Social History   Tobacco Use   Smoking status: Never   Smokeless tobacco: Never  Vaping Use   Vaping Use: Never used  Substance Use Topics   Alcohol use: No   Drug use: No   Family History  Problem Relation Age of Onset   Hypertension Mother    Hyperlipidemia Mother    Throat cancer Father    Early death Son    Allergies  Allergen Reactions   Darvon [Propoxyphene] Nausea And Vomiting   Morphine And Related Itching and Rash   Penicillins Itching and Rash    Pt denies this allergy   Review of Systems  HENT:  Positive for congestion.   All other systems  reviewed and are negative.    Objective:     BP 122/80   Pulse (!) 102   Ht 5\' 8"  (1.727 m)   Wt 172 lb 6.4 oz (78.2 kg)   SpO2 97%   BMI 26.21 kg/m  BP Readings from Last 3 Encounters:  12/20/22 122/80  10/26/22 138/78  10/10/22 126/79   Physical Exam Vitals reviewed.  Constitutional:      General: She is not in acute distress.    Appearance: Normal appearance. She is not toxic-appearing.  HENT:     Head: Normocephalic and atraumatic.     Right Ear: Tympanic membrane, ear canal and external ear normal.     Left Ear: Tympanic membrane, ear canal and external ear normal.     Nose: Congestion and rhinorrhea present.     Mouth/Throat:     Mouth: Mucous membranes are moist.     Pharynx: Oropharynx is clear. No oropharyngeal exudate or posterior oropharyngeal erythema.  Eyes:     General: No scleral icterus.    Extraocular Movements: Extraocular movements intact.     Conjunctiva/sclera: Conjunctivae normal.     Pupils: Pupils are equal, round, and reactive to light.  Cardiovascular:     Rate and Rhythm: Normal rate and regular rhythm.  Pulses: Normal pulses.     Heart sounds: Normal heart sounds. No murmur heard.    No friction rub. No gallop.  Pulmonary:     Effort: Pulmonary effort is normal.     Breath sounds: Normal breath sounds. No wheezing, rhonchi or rales.  Abdominal:     General: Abdomen is flat. Bowel sounds are normal. There is no distension.     Palpations: Abdomen is soft.     Tenderness: There is no abdominal tenderness.  Musculoskeletal:        General: No swelling. Normal range of motion.     Cervical back: Normal range of motion.     Right lower leg: No edema.     Left lower leg: No edema.  Lymphadenopathy:     Cervical: No cervical adenopathy.  Skin:    General: Skin is warm and dry.     Capillary Refill: Capillary refill takes less than 2 seconds.     Coloration: Skin is not jaundiced.  Neurological:     General: No focal deficit present.      Mental Status: She is alert and oriented to person, place, and time.  Psychiatric:        Mood and Affect: Mood normal.        Behavior: Behavior normal.   Last CBC Lab Results  Component Value Date   WBC 3.4 08/11/2021   HGB 12.5 08/11/2021   HCT 38.3 08/11/2021   MCV 88 08/11/2021   MCH 28.6 08/11/2021   RDW 14.7 08/11/2021   PLT 209 08/11/2021   Last metabolic panel Lab Results  Component Value Date   GLUCOSE 129 (H) 04/18/2022   NA 144 04/18/2022   K 4.5 04/18/2022   CL 103 04/18/2022   CO2 26 04/18/2022   BUN 12 04/18/2022   CREATININE 1.30 (H) 11/05/2022   EGFR 51.0 11/19/2022   CALCIUM 9.6 04/18/2022   PHOS 2.8 02/28/2011   PROT 6.6 04/18/2022   ALBUMIN 4.0 04/18/2022   LABGLOB 2.6 04/18/2022   AGRATIO 1.5 04/18/2022   BILITOT 0.5 04/18/2022   ALKPHOS 54 04/18/2022   AST 14 04/18/2022   ALT 7 04/18/2022   ANIONGAP 9 05/06/2021   Last lipids Lab Results  Component Value Date   CHOL 192 04/18/2022   HDL 60 04/18/2022   LDLCALC 107 (H) 04/18/2022   TRIG 143 04/18/2022   CHOLHDL 3.2 04/18/2022   Last hemoglobin A1c Lab Results  Component Value Date   HGBA1C 5.8 (H) 04/18/2022   Last thyroid functions Lab Results  Component Value Date   TSH 3.19 07/03/2018   Last vitamin D Lab Results  Component Value Date   VD25OH 36.2 04/18/2022   Last vitamin B12 and Folate Lab Results  Component Value Date   VITAMINB12 >2,000 (H) 03/04/2014   The 10-year ASCVD risk score (Arnett DK, et al., 2019) is: 29.3%    Assessment & Plan:   Problem List Items Addressed This Visit       Essential hypertension    Remains well-controlled on current antihypertensive regimen.  No medication changes today.      Gastroesophageal reflux disease    Symptoms remain well-controlled on Nexium 20 mg daily.  No medication changes are indicated today.      Controlled diabetes mellitus type 2 with complications (HCC) - Primary    Last A1c 5.8.  She is currently  prescribed glipizide XL 5 mg daily. -Add Jardiance 10 mg daily in the setting of CKD -Repeat urine  microalbumin/creatinine ratio is pending -Due for diabetic eye exam.  She was encouraged to contact her ophthalmologist to schedule follow-up.      CKD (chronic kidney disease) stage 3, GFR 30-59 ml/min (HCC)    CKD 3A.  Urine microalbumin/creatinine ratio pending. -Add Jardiance 10 mg daily      Sarcoidosis    Previously documented history of sarcoidosis.  She is currently prescribed Plaquenil 200 mg daily and has been taking this medication since 2015. -She was strongly encouraged to return to care with her ophthalmologist for repeat examination -Would likely benefit from titrating off of Plaquenil given adverse effects with chronic use.  Will place referral to rheumatology for further evaluation and management.      Chronic pain syndrome    Reports today that she has established care with pain management at [facility]      Colon cancer screening    Cologuard has previously been ordered.  States that she has the kit at home and knows that she needs to complete and return it.      Return in about 3 months (around 03/21/2023).   Billie Lade, MD

## 2022-12-26 DIAGNOSIS — Z1211 Encounter for screening for malignant neoplasm of colon: Secondary | ICD-10-CM | POA: Insufficient documentation

## 2022-12-26 DIAGNOSIS — E038 Other specified hypothyroidism: Secondary | ICD-10-CM | POA: Diagnosis not present

## 2022-12-26 DIAGNOSIS — E1165 Type 2 diabetes mellitus with hyperglycemia: Secondary | ICD-10-CM | POA: Diagnosis not present

## 2022-12-26 DIAGNOSIS — E559 Vitamin D deficiency, unspecified: Secondary | ICD-10-CM | POA: Diagnosis not present

## 2022-12-26 DIAGNOSIS — E782 Mixed hyperlipidemia: Secondary | ICD-10-CM | POA: Diagnosis not present

## 2022-12-26 NOTE — Assessment & Plan Note (Signed)
Remains well-controlled on current antihypertensive regimen. -No medication changes today 

## 2022-12-26 NOTE — Assessment & Plan Note (Signed)
Last A1c 5.8.  She is currently prescribed glipizide XL 5 mg daily. -Add Jardiance 10 mg daily in the setting of CKD -Repeat urine microalbumin/creatinine ratio is pending -Due for diabetic eye exam.  She was encouraged to contact her ophthalmologist to schedule follow-up.

## 2022-12-26 NOTE — Assessment & Plan Note (Signed)
Symptoms remain well-controlled on Nexium 20 mg daily.  No medication changes are indicated today.

## 2022-12-26 NOTE — Assessment & Plan Note (Signed)
CKD 3A.  Urine microalbumin/creatinine ratio pending. -Add Jardiance 10 mg daily

## 2022-12-26 NOTE — Assessment & Plan Note (Signed)
Reports today that she has established care with pain management at [facility]

## 2022-12-26 NOTE — Assessment & Plan Note (Addendum)
Cologuard has previously been ordered.  States that she has the kit at home and knows that she needs to complete and return it.

## 2022-12-26 NOTE — Assessment & Plan Note (Signed)
Previously documented history of sarcoidosis.  She is currently prescribed Plaquenil 200 mg daily and has been taking this medication since 2015. -She was strongly encouraged to return to care with her ophthalmologist for repeat examination -Would likely benefit from titrating off of Plaquenil given adverse effects with chronic use.  Will place referral to rheumatology for further evaluation and management.

## 2022-12-28 LAB — CMP14+EGFR
ALT: 17 IU/L (ref 0–32)
AST: 20 IU/L (ref 0–40)
Albumin/Globulin Ratio: 1.7 (ref 1.2–2.2)
Albumin: 4.4 g/dL (ref 3.8–4.8)
Alkaline Phosphatase: 52 IU/L (ref 44–121)
BUN/Creatinine Ratio: 14 (ref 12–28)
BUN: 16 mg/dL (ref 8–27)
Bilirubin Total: 0.3 mg/dL (ref 0.0–1.2)
CO2: 21 mmol/L (ref 20–29)
Calcium: 9.4 mg/dL (ref 8.7–10.3)
Chloride: 103 mmol/L (ref 96–106)
Creatinine, Ser: 1.12 mg/dL — ABNORMAL HIGH (ref 0.57–1.00)
Globulin, Total: 2.6 g/dL (ref 1.5–4.5)
Glucose: 194 mg/dL — ABNORMAL HIGH (ref 70–99)
Potassium: 4.3 mmol/L (ref 3.5–5.2)
Sodium: 141 mmol/L (ref 134–144)
Total Protein: 7 g/dL (ref 6.0–8.5)
eGFR: 52 mL/min/{1.73_m2} — ABNORMAL LOW (ref >59)

## 2022-12-28 LAB — CBC WITH DIFFERENTIAL/PLATELET
Basophils Absolute: 0.1 10*3/uL (ref 0.0–0.2)
Basos: 1 %
EOS (ABSOLUTE): 0.1 10*3/uL (ref 0.0–0.4)
Eos: 2 %
Hematocrit: 41.5 % (ref 34.0–46.6)
Hemoglobin: 12.9 g/dL (ref 11.1–15.9)
Immature Grans (Abs): 0 10*3/uL (ref 0.0–0.1)
Immature Granulocytes: 0 %
Lymphocytes Absolute: 2.7 10*3/uL (ref 0.7–3.1)
Lymphs: 47 %
MCH: 27.6 pg (ref 26.6–33.0)
MCHC: 31.1 g/dL — ABNORMAL LOW (ref 31.5–35.7)
MCV: 89 fL (ref 79–97)
Monocytes Absolute: 0.4 10*3/uL (ref 0.1–0.9)
Monocytes: 6 %
Neutrophils Absolute: 2.5 10*3/uL (ref 1.4–7.0)
Neutrophils: 44 %
Platelets: 251 10*3/uL (ref 150–450)
RBC: 4.68 x10E6/uL (ref 3.77–5.28)
RDW: 14.5 % (ref 11.7–15.4)
WBC: 5.7 10*3/uL (ref 3.4–10.8)

## 2022-12-28 LAB — HEMOGLOBIN A1C
Est. average glucose Bld gHb Est-mCnc: 143 mg/dL
Hgb A1c MFr Bld: 6.6 % — ABNORMAL HIGH (ref 4.8–5.6)

## 2022-12-28 LAB — LIPID PANEL
Chol/HDL Ratio: 2.9 ratio (ref 0.0–4.4)
Cholesterol, Total: 180 mg/dL (ref 100–199)
HDL: 62 mg/dL (ref >39)
LDL Chol Calc (NIH): 95 mg/dL (ref 0–99)
Triglycerides: 131 mg/dL (ref 0–149)
VLDL Cholesterol Cal: 23 mg/dL (ref 5–40)

## 2022-12-28 LAB — MICROALBUMIN / CREATININE URINE RATIO
Creatinine, Urine: 23.9 mg/dL
Microalb/Creat Ratio: 88 mg/g creat — ABNORMAL HIGH (ref 0–29)
Microalbumin, Urine: 21 ug/mL

## 2022-12-28 LAB — TSH+FREE T4
Free T4: 1.06 ng/dL (ref 0.82–1.77)
TSH: 2.12 u[IU]/mL (ref 0.450–4.500)

## 2022-12-30 ENCOUNTER — Other Ambulatory Visit: Payer: Self-pay | Admitting: Family Medicine

## 2022-12-30 DIAGNOSIS — E118 Type 2 diabetes mellitus with unspecified complications: Secondary | ICD-10-CM

## 2022-12-30 MED ORDER — GLIPIZIDE ER 5 MG PO TB24: 5.0000 mg | ORAL_TABLET | Freq: Two times a day (BID) | ORAL | 1 refills | Status: DC

## 2022-12-30 MED ORDER — EMPAGLIFLOZIN 25 MG PO TABS: 25.0000 mg | ORAL_TABLET | Freq: Every day | ORAL | 1 refills | Status: DC

## 2023-01-01 DIAGNOSIS — E119 Type 2 diabetes mellitus without complications: Secondary | ICD-10-CM | POA: Diagnosis not present

## 2023-01-01 DIAGNOSIS — M5416 Radiculopathy, lumbar region: Secondary | ICD-10-CM | POA: Diagnosis not present

## 2023-01-01 DIAGNOSIS — E663 Overweight: Secondary | ICD-10-CM | POA: Diagnosis not present

## 2023-01-01 DIAGNOSIS — R030 Elevated blood-pressure reading, without diagnosis of hypertension: Secondary | ICD-10-CM | POA: Diagnosis not present

## 2023-01-01 DIAGNOSIS — Z79899 Other long term (current) drug therapy: Secondary | ICD-10-CM | POA: Diagnosis not present

## 2023-01-01 DIAGNOSIS — G894 Chronic pain syndrome: Secondary | ICD-10-CM | POA: Diagnosis not present

## 2023-01-07 ENCOUNTER — Other Ambulatory Visit: Payer: Self-pay | Admitting: Internal Medicine

## 2023-01-09 ENCOUNTER — Other Ambulatory Visit: Payer: Self-pay | Admitting: Family Medicine

## 2023-01-10 ENCOUNTER — Other Ambulatory Visit: Payer: Self-pay | Admitting: Internal Medicine

## 2023-01-10 ENCOUNTER — Other Ambulatory Visit: Payer: Self-pay

## 2023-01-10 DIAGNOSIS — F419 Anxiety disorder, unspecified: Secondary | ICD-10-CM

## 2023-01-10 DIAGNOSIS — K219 Gastro-esophageal reflux disease without esophagitis: Secondary | ICD-10-CM

## 2023-01-10 MED ORDER — SODIUM BICARBONATE 650 MG PO TABS: ORAL_TABLET | ORAL | 0 refills | Status: DC

## 2023-01-10 NOTE — Telephone Encounter (Signed)
Prescription filled on 09/21/22 by previous PCP.

## 2023-01-14 ENCOUNTER — Other Ambulatory Visit: Payer: Self-pay | Admitting: Internal Medicine

## 2023-01-22 ENCOUNTER — Other Ambulatory Visit: Payer: Self-pay | Admitting: Nurse Practitioner

## 2023-01-23 ENCOUNTER — Other Ambulatory Visit: Payer: Self-pay | Admitting: Internal Medicine

## 2023-01-23 DIAGNOSIS — K219 Gastro-esophageal reflux disease without esophagitis: Secondary | ICD-10-CM

## 2023-01-30 DIAGNOSIS — E663 Overweight: Secondary | ICD-10-CM | POA: Diagnosis not present

## 2023-01-30 DIAGNOSIS — Z79899 Other long term (current) drug therapy: Secondary | ICD-10-CM | POA: Diagnosis not present

## 2023-01-30 DIAGNOSIS — G894 Chronic pain syndrome: Secondary | ICD-10-CM | POA: Diagnosis not present

## 2023-01-30 DIAGNOSIS — M5416 Radiculopathy, lumbar region: Secondary | ICD-10-CM | POA: Diagnosis not present

## 2023-01-30 DIAGNOSIS — E119 Type 2 diabetes mellitus without complications: Secondary | ICD-10-CM | POA: Diagnosis not present

## 2023-01-30 DIAGNOSIS — R030 Elevated blood-pressure reading, without diagnosis of hypertension: Secondary | ICD-10-CM | POA: Diagnosis not present

## 2023-02-07 ENCOUNTER — Other Ambulatory Visit: Payer: Self-pay | Admitting: Internal Medicine

## 2023-02-13 ENCOUNTER — Other Ambulatory Visit: Payer: Self-pay

## 2023-02-13 ENCOUNTER — Telehealth: Payer: Self-pay | Admitting: Internal Medicine

## 2023-02-13 DIAGNOSIS — F419 Anxiety disorder, unspecified: Secondary | ICD-10-CM

## 2023-02-13 DIAGNOSIS — K219 Gastro-esophageal reflux disease without esophagitis: Secondary | ICD-10-CM

## 2023-02-13 MED ORDER — SODIUM BICARBONATE 650 MG PO TABS: ORAL_TABLET | ORAL | 0 refills | Status: DC

## 2023-02-13 MED ORDER — BUSPIRONE HCL 7.5 MG PO TABS: 7.5000 mg | ORAL_TABLET | Freq: Two times a day (BID) | ORAL | 0 refills | Status: DC

## 2023-02-13 NOTE — Telephone Encounter (Signed)
Prescription Request  02/13/2023  LOV: 12/20/2022  What is the name of the medication or equipment? busPIRone (BUSPAR) 7.5 MG tablet [[id number]]   sodium bicarbonate 650 MG tablet [[id number]]   Have you contacted your pharmacy to request a refill? Yes   Which pharmacy would you like this sent to?  [facility] - [place], [place] - [address] [place] [address] Phone: [phone] Fax: [phone]    Patient notified that their request is being sent to the clinical staff for review and that they should receive a response within 2 business days.   Please advise at Mobile [phone] (mobile)

## 2023-02-13 NOTE — Telephone Encounter (Signed)
Refills sent to pharmacy. 

## 2023-02-27 DIAGNOSIS — M5416 Radiculopathy, lumbar region: Secondary | ICD-10-CM | POA: Diagnosis not present

## 2023-02-27 DIAGNOSIS — G894 Chronic pain syndrome: Secondary | ICD-10-CM | POA: Diagnosis not present

## 2023-02-27 DIAGNOSIS — E119 Type 2 diabetes mellitus without complications: Secondary | ICD-10-CM | POA: Diagnosis not present

## 2023-02-27 DIAGNOSIS — R030 Elevated blood-pressure reading, without diagnosis of hypertension: Secondary | ICD-10-CM | POA: Diagnosis not present

## 2023-02-27 DIAGNOSIS — Z79899 Other long term (current) drug therapy: Secondary | ICD-10-CM | POA: Diagnosis not present

## 2023-02-27 DIAGNOSIS — E663 Overweight: Secondary | ICD-10-CM | POA: Diagnosis not present

## 2023-03-01 ENCOUNTER — Other Ambulatory Visit: Payer: Self-pay | Admitting: Family Medicine

## 2023-03-01 DIAGNOSIS — I10 Essential (primary) hypertension: Secondary | ICD-10-CM

## 2023-03-01 DIAGNOSIS — N3281 Overactive bladder: Secondary | ICD-10-CM

## 2023-03-07 ENCOUNTER — Other Ambulatory Visit: Payer: Self-pay | Admitting: Internal Medicine

## 2023-03-08 ENCOUNTER — Other Ambulatory Visit: Payer: Self-pay | Admitting: Internal Medicine

## 2023-03-14 ENCOUNTER — Other Ambulatory Visit: Payer: Self-pay | Admitting: Internal Medicine

## 2023-03-14 DIAGNOSIS — K219 Gastro-esophageal reflux disease without esophagitis: Secondary | ICD-10-CM

## 2023-03-14 DIAGNOSIS — F419 Anxiety disorder, unspecified: Secondary | ICD-10-CM

## 2023-03-21 ENCOUNTER — Ambulatory Visit: Payer: PPO | Admitting: Internal Medicine

## 2023-03-26 ENCOUNTER — Encounter: Payer: Self-pay | Admitting: Internal Medicine

## 2023-03-29 ENCOUNTER — Telehealth: Payer: Self-pay | Admitting: Internal Medicine

## 2023-03-29 NOTE — Telephone Encounter (Signed)
Patient is calling says can Dr Durwin Nora re-order another cologaurd due to hers being expired. Please advise Thank you

## 2023-03-29 NOTE — Telephone Encounter (Signed)
Spoke with patient and will reach out to [employer].

## 2023-04-03 DIAGNOSIS — G894 Chronic pain syndrome: Secondary | ICD-10-CM | POA: Diagnosis not present

## 2023-04-03 DIAGNOSIS — R030 Elevated blood-pressure reading, without diagnosis of hypertension: Secondary | ICD-10-CM | POA: Diagnosis not present

## 2023-04-03 DIAGNOSIS — E119 Type 2 diabetes mellitus without complications: Secondary | ICD-10-CM | POA: Diagnosis not present

## 2023-04-03 DIAGNOSIS — M5416 Radiculopathy, lumbar region: Secondary | ICD-10-CM | POA: Diagnosis not present

## 2023-04-03 DIAGNOSIS — E663 Overweight: Secondary | ICD-10-CM | POA: Diagnosis not present

## 2023-04-08 ENCOUNTER — Other Ambulatory Visit: Payer: Self-pay | Admitting: Internal Medicine

## 2023-04-11 ENCOUNTER — Other Ambulatory Visit: Payer: Self-pay | Admitting: Internal Medicine

## 2023-04-11 DIAGNOSIS — Z961 Presence of intraocular lens: Secondary | ICD-10-CM | POA: Diagnosis not present

## 2023-04-11 DIAGNOSIS — D869 Sarcoidosis, unspecified: Secondary | ICD-10-CM | POA: Diagnosis not present

## 2023-04-11 DIAGNOSIS — K219 Gastro-esophageal reflux disease without esophagitis: Secondary | ICD-10-CM

## 2023-04-11 DIAGNOSIS — H04123 Dry eye syndrome of bilateral lacrimal glands: Secondary | ICD-10-CM | POA: Diagnosis not present

## 2023-04-11 DIAGNOSIS — Z79899 Other long term (current) drug therapy: Secondary | ICD-10-CM | POA: Diagnosis not present

## 2023-04-11 DIAGNOSIS — M069 Rheumatoid arthritis, unspecified: Secondary | ICD-10-CM | POA: Diagnosis not present

## 2023-04-11 DIAGNOSIS — E119 Type 2 diabetes mellitus without complications: Secondary | ICD-10-CM | POA: Diagnosis not present

## 2023-04-11 LAB — HM DIABETES EYE EXAM

## 2023-04-12 MED ORDER — SERTRALINE HCL 100 MG PO TABS: 100.0000 mg | ORAL_TABLET | Freq: Every day | ORAL | 0 refills | Status: DC

## 2023-04-16 ENCOUNTER — Other Ambulatory Visit: Payer: Self-pay | Admitting: Internal Medicine

## 2023-04-16 DIAGNOSIS — F419 Anxiety disorder, unspecified: Secondary | ICD-10-CM

## 2023-04-16 MED ORDER — BUSPIRONE HCL 7.5 MG PO TABS: 7.5000 mg | ORAL_TABLET | Freq: Two times a day (BID) | ORAL | 0 refills | Status: DC

## 2023-04-17 ENCOUNTER — Ambulatory Visit (INDEPENDENT_AMBULATORY_CARE_PROVIDER_SITE_OTHER): Payer: PPO | Admitting: Internal Medicine

## 2023-04-17 VITALS — BP 129/75 | HR 106 | Ht 68.0 in | Wt 171.2 lb

## 2023-04-17 DIAGNOSIS — Z7984 Long term (current) use of oral hypoglycemic drugs: Secondary | ICD-10-CM

## 2023-04-17 DIAGNOSIS — E118 Type 2 diabetes mellitus with unspecified complications: Secondary | ICD-10-CM | POA: Diagnosis not present

## 2023-04-17 DIAGNOSIS — K1379 Other lesions of oral mucosa: Secondary | ICD-10-CM

## 2023-04-17 DIAGNOSIS — Z1211 Encounter for screening for malignant neoplasm of colon: Secondary | ICD-10-CM

## 2023-04-17 DIAGNOSIS — E782 Mixed hyperlipidemia: Secondary | ICD-10-CM | POA: Diagnosis not present

## 2023-04-17 MED ORDER — DOXEPIN HCL 10 MG/ML PO CONC: 10.0000 mg | Freq: Two times a day (BID) | ORAL | 2 refills | Status: DC | PRN

## 2023-04-17 MED ORDER — ATORVASTATIN CALCIUM 40 MG PO TABS: 40.0000 mg | ORAL_TABLET | Freq: Every day | ORAL | 3 refills | Status: DC

## 2023-04-17 MED ORDER — GLIPIZIDE ER 5 MG PO TB24
5.0000 mg | ORAL_TABLET | Freq: Every day | ORAL | Status: DC
Start: 2023-04-17 — End: 2023-09-16

## 2023-04-17 MED ORDER — LIDOCAINE VISCOUS HCL 2 % MT SOLN: 5.0000 mL | Freq: Three times a day (TID) | OROMUCOSAL | 0 refills | Status: AC | PRN

## 2023-04-17 NOTE — Patient Instructions (Addendum)
It was a pleasure to see you today.  Thank you for giving [place] the opportunity to be involved in your care.  Below is a brief recap of your visit and next steps.  We will plan to see you again in 3 months.  Summary Continue jardiance 25 mg daily Reduce glipizide to 5 mg daily Switch to atorvastatin 40 mg daily Discontinue simvastatin Doxepin refilled Try magic mouthwash for oral lesions Follow up in 3 months    Schedule your Medicare Annual Wellness Visit at checkout.

## 2023-04-17 NOTE — Progress Notes (Unsigned)
Established Patient Office Visit  Subjective   Patient ID: [name], female    DOB: 1945-10-14  Age: 77 y.o. MRN: [id number]  Chief Complaint  Patient presents with   Diabetes    Follow up   [name] returns to care today for routine follow-up.  She was last evaluated by me on 4/25 Jardiance 10 mg daily was added at that time in the setting of diabetes mellitus and CKD.  She was also referred to rheumatology due to setting of chronic Plaquenil use for management of sarcoidosis.  In the interim, she has been evaluated by pain management.  She also completed repeat labs on 5/1.  The frequency of glipizide was increased to twice daily and Jardiance was increased to 25 mg daily as a result.  Past Medical History:  Diagnosis Date   Arthritis    Coronary artery disease    Diabetes mellitus without complication (HCC)    Encephalopathy 03/02/2014   Hyperlipidemia    Hypertension    Osteoporosis    Referred otalgia of right ear 02/11/2017   Renal disorder    Sarcoidosis    Throat pain 03/03/2020   Past Surgical History:  Procedure Laterality Date   ANKLE SURGERY     BACK SURGERY     FRACTURE SURGERY     tibia    Social History   Tobacco Use   Smoking status: Never   Smokeless tobacco: Never  Vaping Use   Vaping status: Never Used  Substance Use Topics   Alcohol use: No   Drug use: No   Family History  Problem Relation Age of Onset   Hypertension Mother    Hyperlipidemia Mother    Throat cancer Father    Early death Son    Allergies  Allergen Reactions   Darvon [Propoxyphene] Nausea And Vomiting   Morphine And Codeine Itching and Rash   Penicillins Itching and Rash    Pt denies this allergy   ROS    Objective:     BP 129/75   Pulse (!) 106   Ht 5\' 8"  (1.727 m)   Wt 171 lb 3.2 oz (77.7 kg)   SpO2 94%   BMI 26.03 kg/m  BP Readings from Last 3 Encounters:  04/17/23 129/75  12/20/22 122/80  10/26/22 138/78   Physical Exam  Last CBC Lab Results   Component Value Date   WBC 5.7 12/26/2022   HGB 12.9 12/26/2022   HCT 41.5 12/26/2022   MCV 89 12/26/2022   MCH 27.6 12/26/2022   RDW 14.5 12/26/2022   PLT 251 12/26/2022   Last metabolic panel Lab Results  Component Value Date   GLUCOSE 194 (H) 12/26/2022   NA 141 12/26/2022   K 4.3 12/26/2022   CL 103 12/26/2022   CO2 21 12/26/2022   BUN 16 12/26/2022   CREATININE 1.12 (H) 12/26/2022   EGFR 52 (L) 12/26/2022   CALCIUM 9.4 12/26/2022   PHOS 2.8 02/28/2011   PROT 7.0 12/26/2022   ALBUMIN 4.4 12/26/2022   LABGLOB 2.6 12/26/2022   AGRATIO 1.7 12/26/2022   BILITOT 0.3 12/26/2022   ALKPHOS 52 12/26/2022   AST 20 12/26/2022   ALT 17 12/26/2022   ANIONGAP 9 05/06/2021   Last lipids Lab Results  Component Value Date   CHOL 180 12/26/2022   HDL 62 12/26/2022   LDLCALC 95 12/26/2022   TRIG 131 12/26/2022   CHOLHDL 2.9 12/26/2022   Last hemoglobin A1c Lab Results  Component Value Date  HGBA1C 6.6 (H) 12/26/2022   Last thyroid functions Lab Results  Component Value Date   TSH 2.120 12/26/2022   Last vitamin D Lab Results  Component Value Date   VD25OH 36.2 04/18/2022   Last vitamin B12 and Folate Lab Results  Component Value Date   VITAMINB12 >2,000 (H) 03/04/2014   The 10-year ASCVD risk score (Arnett DK, et al., 2019) is: 30.3%    Assessment & Plan:   Problem List Items Addressed This Visit   None   No follow-ups on file.    Billie Lade, MD

## 2023-04-18 ENCOUNTER — Encounter: Payer: Self-pay | Admitting: Internal Medicine

## 2023-04-18 DIAGNOSIS — K1379 Other lesions of oral mucosa: Secondary | ICD-10-CM | POA: Insufficient documentation

## 2023-04-18 MED ORDER — LISINOPRIL 2.5 MG PO TABS: ORAL_TABLET | ORAL | 0 refills | Status: DC

## 2023-04-18 NOTE — Assessment & Plan Note (Signed)
Raised, pink lesions are noted on the buccal mucosa.  She states that these have developed since running out of doxepin oral solution that she has previously been prescribed for oral discomfort. -Doxepin refilled today.  I have also prescribed Magic mouthwash

## 2023-04-18 NOTE — Assessment & Plan Note (Signed)
A1c 6.6 in May, which is an increase from 5.8 previously.  Glipizide XL was increased to 5 mg twice daily and Jardiance increased to 25 mg daily as a result.  She states that she can tell her blood sugar is running lower. -Reduce frequency of glipizide back to 5 mg daily.  Continue Jardiance 25 mg daily -Repeat A1c at follow-up in 3 months

## 2023-04-18 NOTE — Assessment & Plan Note (Signed)
New Cologuard kit ordered today.

## 2023-04-18 NOTE — Assessment & Plan Note (Signed)
Lipid panel updated in May.  Total cholesterol 180 and LDL 95.  Her 10-year ASCVD risk is 30.3%.  She is currently prescribed simvastatin 40 mg daily and ezetimibe 10 mg daily. -Discontinue simvastatin and start atorvastatin 40 mg daily -Repeat lipid panel at follow-up in 3 months

## 2023-04-23 ENCOUNTER — Other Ambulatory Visit: Payer: Self-pay

## 2023-04-23 ENCOUNTER — Other Ambulatory Visit: Payer: Self-pay | Admitting: Internal Medicine

## 2023-04-23 ENCOUNTER — Telehealth: Payer: Self-pay | Admitting: Internal Medicine

## 2023-04-23 MED ORDER — METHOCARBAMOL 500 MG PO TABS: 500.0000 mg | ORAL_TABLET | Freq: Four times a day (QID) | ORAL | 0 refills | Status: DC

## 2023-04-23 NOTE — Telephone Encounter (Signed)
Refills sent

## 2023-04-23 NOTE — Telephone Encounter (Signed)
Patient called said her pain management provider and needs refill for her pain medicine methocarbamol (ROBAXIN) 500 MG tablet  and due this week. Can Dr Durwin Nora okay to fill this medicine for this month and she will have her new pain management doctor next month.  Pharmacy: [facility]

## 2023-05-03 DIAGNOSIS — E119 Type 2 diabetes mellitus without complications: Secondary | ICD-10-CM | POA: Diagnosis not present

## 2023-05-03 DIAGNOSIS — M961 Postlaminectomy syndrome, not elsewhere classified: Secondary | ICD-10-CM | POA: Diagnosis not present

## 2023-05-03 DIAGNOSIS — Z79899 Other long term (current) drug therapy: Secondary | ICD-10-CM | POA: Diagnosis not present

## 2023-05-03 DIAGNOSIS — G894 Chronic pain syndrome: Secondary | ICD-10-CM | POA: Diagnosis not present

## 2023-05-03 DIAGNOSIS — N183 Chronic kidney disease, stage 3 unspecified: Secondary | ICD-10-CM | POA: Diagnosis not present

## 2023-05-03 DIAGNOSIS — M5416 Radiculopathy, lumbar region: Secondary | ICD-10-CM | POA: Diagnosis not present

## 2023-05-03 DIAGNOSIS — E663 Overweight: Secondary | ICD-10-CM | POA: Diagnosis not present

## 2023-05-07 ENCOUNTER — Other Ambulatory Visit: Payer: Self-pay | Admitting: Internal Medicine

## 2023-05-07 DIAGNOSIS — K219 Gastro-esophageal reflux disease without esophagitis: Secondary | ICD-10-CM

## 2023-05-07 DIAGNOSIS — Z79899 Other long term (current) drug therapy: Secondary | ICD-10-CM | POA: Diagnosis not present

## 2023-05-08 ENCOUNTER — Other Ambulatory Visit: Payer: Self-pay | Admitting: Family Medicine

## 2023-05-08 DIAGNOSIS — K219 Gastro-esophageal reflux disease without esophagitis: Secondary | ICD-10-CM

## 2023-05-08 MED ORDER — ESOMEPRAZOLE MAGNESIUM 20 MG PO CPDR
20.0000 mg | DELAYED_RELEASE_CAPSULE | Freq: Every day | ORAL | 1 refills | Status: DC
Start: 2023-05-08 — End: 2023-10-25

## 2023-05-10 ENCOUNTER — Other Ambulatory Visit: Payer: Self-pay | Admitting: Internal Medicine

## 2023-05-10 DIAGNOSIS — K219 Gastro-esophageal reflux disease without esophagitis: Secondary | ICD-10-CM

## 2023-05-14 ENCOUNTER — Other Ambulatory Visit: Payer: Self-pay | Admitting: Internal Medicine

## 2023-05-14 DIAGNOSIS — F419 Anxiety disorder, unspecified: Secondary | ICD-10-CM

## 2023-05-14 MED ORDER — BUSPIRONE HCL 7.5 MG PO TABS
7.5000 mg | ORAL_TABLET | Freq: Two times a day (BID) | ORAL | 0 refills | Status: DC
Start: 2023-05-14 — End: 2023-06-12

## 2023-05-15 ENCOUNTER — Ambulatory Visit (INDEPENDENT_AMBULATORY_CARE_PROVIDER_SITE_OTHER): Payer: PPO

## 2023-05-15 VITALS — Ht 68.0 in | Wt 165.0 lb

## 2023-05-15 DIAGNOSIS — Z1231 Encounter for screening mammogram for malignant neoplasm of breast: Secondary | ICD-10-CM

## 2023-05-15 DIAGNOSIS — Z780 Asymptomatic menopausal state: Secondary | ICD-10-CM

## 2023-05-15 DIAGNOSIS — Z1211 Encounter for screening for malignant neoplasm of colon: Secondary | ICD-10-CM | POA: Diagnosis not present

## 2023-05-15 DIAGNOSIS — Z0000 Encounter for general adult medical examination without abnormal findings: Secondary | ICD-10-CM | POA: Diagnosis not present

## 2023-05-15 NOTE — Patient Instructions (Addendum)
[name] , Thank you for taking time to come for your Medicare Wellness Visit. I appreciate your ongoing commitment to your health goals. Please review the following plan we discussed and let me know if I can assist you in the future.   Referrals/Orders/Follow-Ups/Clinician Recommendations:  Follow up: December 10, 2-25 at 11:20am virtual visit  You have an order for:  []   2D Mammogram  [x]   3D Mammogram  [x]   Bone Density   []   Lung Cancer Screening  Please call for appointment:   [facility] [address] [place], [place] [address] [phone]  Make sure to wear two-piece clothing.  No lotions powders or deodorants the day of the appointment Make sure to bring picture ID and insurance card.  Bring list of medications you are currently taking including any supplements.   Schedule your  screening mammogram through MyChart!   Log into your MyChart account.  Go to 'Visit' (or 'Appointments' if on mobile App) --> Schedule an Appointment  Under 'Select a Reason for Visit' choose the Mammogram Screening option.  Complete the pre-visit questions and select the time and place that best fits your schedule.  An order has been placed for a Cologuard for you. They will mail you the kit with instructions on how to obtain the sample and send it back in to be tested. If you do not received your kit, please call our office and let us know.    This is a list of the screening recommended for you and due dates:  Health Maintenance  Topic Date Due   DTaP/Tdap/Td vaccine (1 - Tdap) Never done   Cologuard (Stool DNA test)  Never done   COVID-19 Vaccine (3 - Moderna risk series) 12/22/2019   DEXA scan (bone density measurement)  07/16/2020   Mammogram  07/05/2022   Flu Shot  03/28/2023   Hemoglobin A1C  06/28/2023   Complete foot exam   09/06/2023   Yearly kidney function blood test for diabetes  12/26/2023   Yearly kidney health urinalysis  for diabetes  12/26/2023   Eye exam for diabetics  04/10/2024   Medicare Annual Wellness Visit  05/14/2024   Pneumonia Vaccine  Completed   Hepatitis C Screening  Completed   Zoster (Shingles) Vaccine  Completed   HPV Vaccine  Aged Out    Advanced directives: (ACP Link)Information on Advanced Care Planning can be found at [facility] [place] Advance Health Care Directives Advance Health Care Directives ([email or web address]   Next Medicare Annual Wellness Visit scheduled for next year: Yes  Preventive Care 65 Years and Older, Female Preventive care refers to lifestyle choices and visits with your health care provider that can promote health and wellness. Preventive care visits are also called wellness exams. What can I expect for my preventive care visit? Counseling Your health care provider may ask you questions about your: Medical history, including: Past medical problems. Family medical history. Pregnancy and menstrual history. History of falls. Current health, including: Memory and ability to understand (cognition). Emotional well-being. Home life and relationship well-being. Sexual activity and sexual health. Lifestyle, including: Alcohol, nicotine or tobacco, and drug use. Access to firearms. Diet, exercise, and sleep habits. Work and work Astronomer. Sunscreen use. Safety issues such as seatbelt and bike helmet use. Physical exam Your health care provider will check your: Height and weight. These may be used to calculate your BMI (body mass index). BMI is a measurement that tells if you  are at a healthy weight. Waist circumference. This measures the distance around your waistline. This measurement also tells if you are at a healthy weight and may help predict your risk of certain diseases, such as type 2 diabetes and high blood pressure. Heart rate and blood pressure. Body temperature. Skin for abnormal spots. What immunizations do I need?  Vaccines are usually  given at various ages, according to a schedule. Your health care provider will recommend vaccines for you based on your age, medical history, and lifestyle or other factors, such as travel or where you work. What tests do I need? Screening Your health care provider may recommend screening tests for certain conditions. This may include: Lipid and cholesterol levels. Hepatitis C test. Hepatitis B test. HIV (human immunodeficiency virus) test. STI (sexually transmitted infection) testing, if you are at risk. Lung cancer screening. Colorectal cancer screening. Diabetes screening. This is done by checking your blood sugar (glucose) after you have not eaten for a while (fasting). Mammogram. Talk with your health care provider about how often you should have regular mammograms. BRCA-related cancer screening. This may be done if you have a family history of breast, ovarian, tubal, or peritoneal cancers. Bone density scan. This is done to screen for osteoporosis. Talk with your health care provider about your test results, treatment options, and if necessary, the need for more tests. Follow these instructions at home: Eating and drinking  Eat a diet that includes fresh fruits and vegetables, whole grains, lean protein, and low-fat dairy products. Limit your intake of foods with high amounts of sugar, saturated fats, and salt. Take vitamin and mineral supplements as recommended by your health care provider. Do not drink alcohol if your health care provider tells you not to drink. If you drink alcohol: Limit how much you have to 0-1 drink a day. Know how much alcohol is in your drink. In the [place] one drink equals one 12 oz bottle of beer (355 mL), one 5 oz glass of wine (148 mL), or one 1 oz glass of hard liquor (44 mL). Lifestyle Brush your teeth every morning and night with fluoride toothpaste. Floss one time each day. Exercise for at least 30 minutes 5 or more days each week. Do not use any  products that contain nicotine or tobacco. These products include cigarettes, chewing tobacco, and vaping devices, such as e-cigarettes. If you need help quitting, ask your health care provider. Do not use drugs. If you are sexually active, practice safe sex. Use a condom or other form of protection in order to prevent STIs. Take aspirin only as told by your health care provider. Make sure that you understand how much to take and what form to take. Work with your health care provider to find out whether it is safe and beneficial for you to take aspirin daily. Ask your health care provider if you need to take a cholesterol-lowering medicine (statin). Find healthy ways to manage stress, such as: Meditation, yoga, or listening to music. Journaling. Talking to a trusted person. Spending time with friends and family. Minimize exposure to UV radiation to reduce your risk of skin cancer. Safety Always wear your seat belt while driving or riding in a vehicle. Do not drive: If you have been drinking alcohol. Do not ride with someone who has been drinking. When you are tired or distracted. While texting. If you have been using any mind-altering substances or drugs. Wear a helmet and other protective equipment during sports activities. If you have  firearms in your house, make sure you follow all gun safety procedures. What's next? Visit your health care provider once a year for an annual wellness visit. Ask your health care provider how often you should have your eyes and teeth checked. Stay up to date on all vaccines. This information is not intended to replace advice given to you by your health care provider. Make sure you discuss any questions you have with your health care provider. Document Revised: 02/08/2021 Document Reviewed: 02/08/2021 Elsevier Patient Education  2024 [employer]. Understanding Your Risk for Falls Millions of people have serious injuries from falls each year. It is  important to understand your risk of falling. Talk with your health care provider about your risk and what you can do to lower it. If you do have a serious fall, make sure to tell your provider. Falling once raises your risk of falling again. How can falls affect me? Serious injuries from falls are common. These include: Broken bones, such as hip fractures. Head injuries, such as traumatic brain injuries (TBI) or concussions. A fear of falling can cause you to avoid activities and stay at home. This can make your muscles weaker and raise your risk for a fall. What can increase my risk? There are a number of risk factors that increase your risk for falling. The more risk factors you have, the higher your risk of falling. Serious injuries from a fall happen most often to people who are older than 75 years old. Teenagers and young adults ages 102-29 are also at higher risk. Common risk factors include: Weakness in the lower body. Being generally weak or confused due to long-term (chronic) illness. Dizziness or balance problems. Poor vision. Medicines that cause dizziness or drowsiness. These may include: Medicines for your blood pressure, heart, anxiety, insomnia, or swelling (edema). Pain medicines. Muscle relaxants. Other risk factors include: Drinking alcohol. Having had a fall in the past. Having foot pain or wearing improper footwear. Working at a dangerous job. Having any of the following in your home: Tripping hazards, such as floor clutter or loose rugs. Poor lighting. Pets. Having dementia or memory loss. What actions can I take to lower my risk of falling?     Physical activity Stay physically fit. Do strength and balance exercises. Consider taking a regular class to build strength and balance. Yoga and tai chi are good options. Vision Have your eyes checked every year and your prescription for glasses or contacts updated as needed. Shoes and walking aids Wear non-skid  shoes. Wear shoes that have rubber soles and low heels. Do not wear high heels. Do not walk around the house in socks or slippers. Use a cane or walker as told by your provider. Home safety Attach secure railings on both sides of your stairs. Install grab bars for your bathtub, shower, and toilet. Use a non-skid mat in your bathtub or shower. Attach bath mats securely with double-sided, non-slip rug tape. Use good lighting in all rooms. Keep a flashlight near your bed. Make sure there is a clear path from your bed to the bathroom. Use night-lights. Do not use throw rugs. Make sure all carpeting is taped or tacked down securely. Remove all clutter from walkways and stairways, including extension cords. Repair uneven or broken steps and floors. Avoid walking on icy or slippery surfaces. Walk on the grass instead of on icy or slick sidewalks. Use ice melter to get rid of ice on walkways in the winter. Use a cordless phone.  Questions to ask your health care provider Can you help me check my risk for a fall? Do any of my medicines make me more likely to fall? Should I take a vitamin D supplement? What exercises can I do to improve my strength and balance? Should I make an appointment to have my vision checked? Do I need a bone density test to check for weak bones (osteoporosis)? Would it help to use a cane or a walker? Where to find more information Centers for Disease Control and Prevention, STEADI: TonerPromos.no Community-Based Fall Prevention Programs: TonerPromos.no [employer] on Aging: BaseRingTones.pl Contact a health care provider if: You fall at home. You are afraid of falling at home. You feel weak, drowsy, or dizzy. This information is not intended to replace advice given to you by your health care provider. Make sure you discuss any questions you have with your health care provider. Document Revised: 04/16/2022 Document Reviewed: 04/16/2022 Elsevier Patient Education  2024 Elsevier  Inc. Bone Density Test A bone density test uses a type of X-ray to measure the amount of calcium and other minerals in a person's bones. It can measure bone density in the hip and the spine. The test is similar to having a regular X-ray. This test may also be called: Bone densitometry. Bone mineral density test. Dual-energy X-ray absorptiometry (DEXA). You may have this test to: Diagnose a condition that causes weak or thin bones (osteoporosis). Screen you for osteoporosis. Predict your risk for a broken bone (fracture). Determine how well your osteoporosis treatment is working. Tell a health care provider about: Any allergies you have. All medicines you are taking, including vitamins, herbs, eye drops, creams, and over-the-counter medicines. Any problems you or family members have had with anesthetic medicines. Any blood disorders you have. Any surgeries you have had. Any medical conditions you have. Whether you are pregnant or may be pregnant. Any medical tests you have had within the past 14 days that used contrast material. What are the risks? Generally, this is a safe test. However, it does expose you to a small amount of radiation, which can slightly increase your cancer risk. What happens before the test? Do not take any calcium supplements within the 24 hours before your test. You will need to remove all metal jewelry, eyeglasses, removable dental appliances, and any other metal objects on your body. What happens during the test?  You will lie down on an exam table. There will be an X-ray generator below you and an imaging device above you. Other devices, such as boxes or braces, may be used to position your body properly for the scan. The machine will slowly scan your body. You will need to keep very still while the machine does the scan. The images will show up on a screen in the room. Images will be examined by a specialist after your test is finished. The procedure may vary  among health care providers and hospitals. What can I expect after the test? It is up to you to get the results of your test. Ask your health care provider, or the department that is doing the test, when your results will be ready. Summary A bone density test is an imaging test that uses a type of X-ray to measure the amount of calcium and other minerals in your bones. The test may be used to diagnose or screen you for a condition that causes weak or thin bones (osteoporosis), predict your risk for a broken bone (fracture), or determine how  well your osteoporosis treatment is working. Do not take any calcium supplements within 24 hours before your test. Ask your health care provider, or the department that is doing the test, when your results will be ready. This information is not intended to replace advice given to you by your health care provider. Make sure you discuss any questions you have with your health care provider. Document Revised: 04/26/2021 Document Reviewed: 01/28/2020 Elsevier Patient Education  2024 Elsevier Inc. Colorectal Cancer Screening  Colorectal cancer screening is a group of tests that are used to check for colorectal cancer before symptoms develop. Colorectal refers to the colon and rectum. The colon and rectum are located at the end of the digestive tract and carry stool (feces) out of the body. Who should have screening? All adults who are 20-59 years old should have screening. Your health care provider may recommend screening before age 64. You will have tests every 1-10 years, depending on your results and the type of screening test. Screening recommendations for adults who are 27-10 years old vary depending on a person's health. People older than age 76 should no longer get colorectal cancer screening. You may have screening tests starting before age 42, or more often than other people, if you have any of these risk factors: A personal or family history of colorectal  cancer or abnormal growths known as polyps in your colon. Inflammatory bowel disease, such as ulcerative colitis or Crohn's disease. A history of having radiation treatment to the abdomen or the area between the hip bones (pelvic area) for cancer. A type of genetic syndrome that is passed from parent to child (hereditary), such as: Lynch syndrome. Familial adenomatous polyposis. Turcot syndrome. Peutz-Jeghers syndrome. MUTYH-associated polyposis (MAP). A personal history of diabetes. Types of tests There are several types of colorectal screening tests. You may have one or more of the following: Guaiac-based fecal occult blood testing. For this test, a stool sample is checked for hidden (occult) blood, which could be a sign of colorectal cancer. Fecal immunochemical test (FIT). For this test, a stool sample is checked for blood, which could be a sign of colorectal cancer. Stool DNA test. For this test, a stool sample is checked for blood and changes in DNA that could lead to colorectal cancer. Sigmoidoscopy. During this test, a thin, flexible tube with a camera on the end, called a sigmoidoscope, is used to examine the rectum and the lower colon. Colonoscopy. During this test, a long, flexible tube with a camera on the end, called a colonoscope, is used to examine the entire colon and rectum. Also, sometimes a tissue sample is taken to be looked at under a microscope (biopsy) or small polyps are removed during this test. Virtual colonoscopy. Instead of a colonoscope, this type of colonoscopy uses a CT scan to take pictures of the colon and rectum. A CT scan is a type of X-ray that is made using computers. What are the benefits of screening? Screening reduces your risk for colorectal cancer and can help identify cancer at an early stage, when the cancer can be removed or treated more easily. It is common for polyps to form in the lining of the colon, especially as you age. These polyps may be  cancerous or become cancerous over time. Screening can identify these polyps. What are the risks of screening? Generally, these are safe tests. However, problems may occur, including: The need for more tests to confirm results from a stool sample test. Stool sample tests have  fewer risks than other types of screening tests. Being exposed to low levels of radiation, if you had a test involving X-rays. This may slightly increase your cancer risk. The benefit of detecting cancer outweighs the slight increase in risk. Bleeding, damage to the intestine, or infection caused by a sigmoidoscopy or colonoscopy. A reaction to medicines given during a sigmoidoscopy or colonoscopy. Talk with your health care provider to understand your risk for colorectal cancer and to make a screening plan that is right for you. Questions to ask your health care provider When should I start colorectal cancer screening? What is my risk for colorectal cancer? How often do I need screening? Which screening tests do I need? How do I get my test results? What do my results mean? Where to find more information Learn more about colorectal cancer screening from: The American Cancer Society: cancer.org National Cancer Institute: cancer.gov Summary Colorectal cancer screening is a group of tests used to check for colorectal cancer before symptoms develop. All adults who are 64-1 years old should have screening. Your health care provider may recommend screening before age 48. You may have screening tests starting before age 29, or more often than other people, if you have certain risk factors. Screening reduces your risk for colorectal cancer and can help identify cancer at an early stage, when the cancer can be removed or treated more easily. Talk with your health care provider to understand your risk for colorectal cancer and to make a screening plan that is right for you. This information is not intended to replace advice  given to you by your health care provider. Make sure you discuss any questions you have with your health care provider. Document Revised: 12/02/2019 Document Reviewed: 12/02/2019 Elsevier Patient Education  2024 Elsevier Inc. Managing Pain Without Opioids Opioids are strong medicines used to treat moderate to severe pain. For some people, especially those who have long-term (chronic) pain, opioids may not be the best choice for pain management due to: Side effects like nausea, constipation, and sleepiness. The risk of addiction (opioid use disorder). The longer you take opioids, the greater your risk of addiction. Pain that lasts for more than 3 months is called chronic pain. Managing chronic pain usually requires more than one approach and is often provided by a team of health care providers working together (multidisciplinary approach). Pain management may be done at a pain management center or pain clinic. How to manage pain without the use of opioids Use non-opioid medicines Non-opioid medicines for pain may include: Over-the-counter or prescription non-steroidal anti-inflammatory drugs (NSAIDs). These may be the first medicines used for pain. They work well for muscle and bone pain, and they reduce swelling. Acetaminophen. This over-the-counter medicine may work well for milder pain but not swelling. Antidepressants. These may be used to treat chronic pain. A certain type of antidepressant (tricyclics) is often used. These medicines are given in lower doses for pain than when used for depression. Anticonvulsants. These are usually used to treat seizures but may also reduce nerve (neuropathic) pain. Muscle relaxants. These relieve pain caused by sudden muscle tightening (spasms). You may also use a pain medicine that is applied to the skin as a patch, cream, or gel (topical analgesic), such as a numbing medicine. These may cause fewer side effects than medicines taken by mouth. Do certain  therapies as directed Some therapies can help with pain management. They include: Physical therapy. You will do exercises to gain strength and flexibility. A physical therapist may  teach you exercises to move and stretch parts of your body that are weak, stiff, or painful. You can learn these exercises at physical therapy visits and practice them at home. Physical therapy may also involve: Massage. Heat wraps or applying heat or cold to affected areas. Electrical signals that interrupt pain signals (transcutaneous electrical nerve stimulation, TENS). Weak lasers that reduce pain and swelling (low-level laser therapy). Signals from your body that help you learn to regulate pain (biofeedback). Occupational therapy. This helps you to learn ways to function at home and work with less pain. Recreational therapy. This involves trying new activities or hobbies, such as a physical activity or drawing. Mental health therapy, including: Cognitive behavioral therapy (CBT). This helps you learn coping skills for dealing with pain. Acceptance and commitment therapy (ACT) to change the way you think and react to pain. Relaxation therapies, including muscle relaxation exercises and mindfulness-based stress reduction. Pain management counseling. This may be individual, family, or group counseling.  Receive medical treatments Medical treatments for pain management include: Nerve block injections. These may include a pain blocker and anti-inflammatory medicines. You may have injections: Near the spine to relieve chronic back or neck pain. Into joints to relieve back or joint pain. Into nerve areas that supply a painful area to relieve body pain. Into muscles (trigger point injections) to relieve some painful muscle conditions. A medical device placed near your spine to help block pain signals and relieve nerve pain or chronic back pain (spinal cord stimulation device). Acupuncture. Follow these instructions  at home Medicines Take over-the-counter and prescription medicines only as told by your health care provider. If you are taking pain medicine, ask your health care providers about possible side effects to watch out for. Do not drive or use heavy machinery while taking prescription opioid pain medicine. Lifestyle  Do not use drugs or alcohol to reduce pain. If you drink alcohol, limit how much you have to: 0-1 drink a day for women who are not pregnant. 0-2 drinks a day for men. Know how much alcohol is in a drink. In the [place] one drink equals one 12 oz bottle of beer (355 mL), one 5 oz glass of wine (148 mL), or one 1 oz glass of hard liquor (44 mL). Do not use any products that contain nicotine or tobacco. These products include cigarettes, chewing tobacco, and vaping devices, such as e-cigarettes. If you need help quitting, ask your health care provider. Eat a healthy diet and maintain a healthy weight. Poor diet and excess weight may make pain worse. Eat foods that are high in fiber. These include fresh fruits and vegetables, whole grains, and beans. Limit foods that are high in fat and processed sugars, such as fried and sweet foods. Exercise regularly. Exercise lowers stress and may help relieve pain. Ask your health care provider what activities and exercises are safe for you. If your health care provider approves, join an exercise class that combines movement and stress reduction. Examples include yoga and tai chi. Get enough sleep. Lack of sleep may make pain worse. Lower stress as much as possible. Practice stress reduction techniques as told by your therapist. General instructions Work with all your pain management providers to find the treatments that work best for you. You are an important member of your pain management team. There are many things you can do to reduce pain on your own. Consider joining an online or in-person support group for people who have chronic pain. Keep  all follow-up  visits. This is important. Where to find more information You can find more information about managing pain without opioids from: American Academy of Pain Medicine: painmed.org Institute for Chronic Pain: instituteforchronicpain.org American Chronic Pain Association: theacpa.org Contact a health care provider if: You have side effects from pain medicine. Your pain gets worse or does not get better with treatments or home therapy. You are struggling with anxiety or depression. Summary Many types of pain can be managed without opioids. Chronic pain may respond better to pain management without opioids. Pain is best managed when you and a team of health care providers work together. Pain management without opioids may include non-opioid medicines, medical treatments, physical therapy, mental health therapy, and lifestyle changes. Tell your health care providers if your pain gets worse or is not being managed well enough. This information is not intended to replace advice given to you by your health care provider. Make sure you discuss any questions you have with your health care provider. Document Revised: 11/23/2020 Document Reviewed: 11/23/2020 Elsevier Patient Education  2024 [employer].

## 2023-05-15 NOTE — Progress Notes (Signed)
 Because this visit was a virtual/telehealth visit,  certain criteria was not obtained, such a blood pressure, CBG if applicable, and timed get up and go. Any medications not marked as "taking" were not mentioned during the medication reconciliation part of the visit. Any vitals not documented were not able to be obtained due to this being a telehealth visit or patient was unable to self-report a recent blood pressure reading due to a lack of equipment at home via telehealth. Vitals that have been documented are verbally provided by the patient.   Subjective:   [name] is a 77 y.o. female who presents for Medicare Annual (Subsequent) preventive examination.  Visit Complete: Virtual  I connected with  Toniann Fail Maull on 05/15/23 by a audio enabled telemedicine application and verified that I am speaking with the correct person using two identifiers.  Patient Location: Home  Provider Location: Home Office  I discussed the limitations of evaluation and management by telemedicine. The patient expressed understanding and agreed to proceed.  Patient Medicare AWV questionnaire was completed by the patient on na; I have confirmed that all information answered by patient is correct and no changes since this date.  Cardiac Risk Factors include: advanced age (>56men, >36 women);diabetes mellitus;dyslipidemia;hypertension;sedentary lifestyle     Objective:    Today's Vitals   05/15/23 1433 05/15/23 1434  Weight: 165 lb (74.8 kg)   Height: 5\' 8"  (1.727 m)   PainSc:  8    Body mass index is 25.09 kg/m.     05/15/2023    2:33 PM 09/26/2021    4:41 PM 05/06/2021   12:11 PM 11/19/2020   10:20 AM 01/18/2017    7:21 AM 03/02/2014    8:41 PM  Advanced Directives  Does Patient Have a Medical Advance Directive? No No No No No Patient does not have advance directive  Would patient like information on creating a medical advance directive? No - Patient declined No - Patient declined No - Patient  declined No - Patient declined No - Patient declined     Current Medications (verified) Outpatient Encounter Medications as of 05/15/2023  Medication Sig   albuterol (VENTOLIN HFA) 108 (90 Base) MCG/ACT inhaler Inhale 1-2 puffs into the lungs every 6 (six) hours as needed for wheezing or shortness of breath.   atorvastatin (LIPITOR) 40 MG tablet Take 1 tablet (40 mg total) by mouth daily.   busPIRone (BUSPAR) 7.5 MG tablet Take 1 tablet (7.5 mg total) by mouth 2 (two) times daily.   doxepin (SINEQUAN) 10 MG/ML solution Take 1 mL (10 mg total) by mouth 2 (two) times daily as needed for sleep (oral discomfort). Dilute 20 mg (2 mL) in 120 mL of water, milk, orange juice, grapefruit juice, tomato juice, prune juice, or pineapple juice and take twice daily as needed for oral discomfort.   DULoxetine (CYMBALTA) 30 MG capsule Take 30 mg by mouth 2 (two) times daily.   empagliflozin (JARDIANCE) 25 MG TABS tablet Take 1 tablet (25 mg total) by mouth daily before breakfast.   esomeprazole (NEXIUM) 20 MG capsule Take 1 capsule (20 mg total) by mouth daily at 2 PM.   ezetimibe (ZETIA) 10 MG tablet Take 1 tablet (10 mg total) by mouth daily.   furosemide (LASIX) 40 MG tablet TAKE ONE TABLET BY MOUTH DAILY   glipiZIDE (GLUCOTROL XL) 5 MG 24 hr tablet Take 1 tablet (5 mg total) by mouth daily with breakfast.   hydroxychloroquine (PLAQUENIL) 200 MG tablet TAKE ONE TABLET BY  MOUTH ONCE DAILY.   lisinopril (ZESTRIL) 2.5 MG tablet TAKE ONE TABLET BY MOUTH ONCE DAILY FOR BLOOD PRESSURE.   magic mouthwash (lidocaine, diphenhydrAMINE, alum & mag hydroxide) suspension Swish and spit 5 mLs 3 (three) times daily as needed for mouth pain.   methocarbamol (ROBAXIN) 500 MG tablet Take 1 tablet (500 mg total) by mouth 4 (four) times daily.   oxybutynin (DITROPAN) 5 MG tablet TAKE (1) TABLET BY MOUTH (3) TIMES DAILY.   oxyCODONE-acetaminophen (PERCOCET) 10-325 MG tablet Take 1 tablet by mouth every 6 (six) hours as needed for  pain.   potassium chloride SA (KLOR-CON M) 20 MEQ tablet TAKE (1) TABLET BY MOUTH TWICE DAILY.   pregabalin (LYRICA) 100 MG capsule TAKE 1 CAPSULE IN THE MORNING AND 2 CAPSULES AT BEDTIME.   sertraline (ZOLOFT) 100 MG tablet Take 1 tablet (100 mg total) by mouth daily.   sodium bicarbonate 650 MG tablet TAKE (1) TABLET BY MOUTH TWICE DAILY.   No facility-administered encounter medications on file as of 05/15/2023.    Allergies (verified) Darvon [propoxyphene], Morphine and codeine, and Penicillins   History: Past Medical History:  Diagnosis Date   Arthritis    Coronary artery disease    Diabetes mellitus without complication (HCC)    Encephalopathy 03/02/2014   Hyperlipidemia    Hypertension    Osteoporosis    Referred otalgia of right ear 02/11/2017   Renal disorder    Sarcoidosis    Throat pain 03/03/2020   Past Surgical History:  Procedure Laterality Date   ANKLE SURGERY     BACK SURGERY     FRACTURE SURGERY     tibia    Family History  Problem Relation Age of Onset   Hypertension Mother    Hyperlipidemia Mother    Throat cancer Father    Early death Son    Social History   Socioeconomic History   Marital status: Married    Spouse name: Ronnie    Number of children: 3   Years of education: 12   Highest education level: Master's degree (e.g., MA, MS, MEng, MEd, MSW, MBA)  Occupational History   Not on file  Tobacco Use   Smoking status: Never   Smokeless tobacco: Never  Vaping Use   Vaping status: Never Used  Substance and Sexual Activity   Alcohol use: No   Drug use: No   Sexual activity: Not Currently  Other Topics Concern   Not on file  Social History Narrative   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)   Lives with husband-Ronnie    Has 3 grown children: two live in town and one out of town   10 grandbabies   8 great grandbabies   Enjoys: reading, talk with friends, church-sing in choir       Diet: eats all food groups   Caffeine: none     Water: 4  cups daily      Wears seat belt   Does not use phone while driving   Psychologist, sport and exercise at home   Social Determinants of Health   Financial Resource Strain: Low Risk  (05/15/2023)   Overall Financial Resource Strain (CARDIA)    Difficulty of Paying Living Expenses: Not hard at all  Food Insecurity: No Food Insecurity (05/15/2023)   Hunger Vital Sign    Worried About Running Out of Food in the Last Year: Never true    Ran Out of Food in the Last Year: Never true  Transportation Needs: No Transportation Needs (05/15/2023)  PRAPARE - Administrator, Civil Service (Medical): No    Lack of Transportation (Non-Medical): No  Physical Activity: Insufficiently Active (05/15/2023)   Exercise Vital Sign    Days of Exercise per Week: 3 days    Minutes of Exercise per Session: 20 min  Stress: No Stress Concern Present (05/15/2023)   [employer] of Occupational Health - Occupational Stress Questionnaire    Feeling of Stress : Not at all  Recent Concern: Stress - Stress Concern Present (04/17/2023)   [employer] of Occupational Health - Occupational Stress Questionnaire    Feeling of Stress : Rather much  Social Connections: Moderately Isolated (05/15/2023)   Social Connection and Isolation Panel [NHANES]    Frequency of Communication with Friends and Family: More than three times a week    Frequency of Social Gatherings with Friends and Family: More than three times a week    Attends Religious Services: Never    Database administrator or Organizations: No    Attends Engineer, structural: Never    Marital Status: Married    Tobacco Counseling Counseling given: Yes   Clinical Intake:  Pre-visit preparation completed: Yes  Pain : 0-10 Pain Score: 8  Pain Type: Chronic pain Pain Location: Back Pain Orientation: Lower Pain Descriptors / Indicators: Aching, Constant Pain Onset: More than a month ago Pain Frequency: Constant     BMI - recorded:  25.09 Nutritional Status: BMI 25 -29 Overweight Nutritional Risks: None Diabetes: Yes CBG done?: No (telehealth visit. unable to obtain cbg) Did pt. bring in CBG monitor from home?: No  How often do you need to have someone help you when you read instructions, pamphlets, or other written materials from your doctor or pharmacy?: 1 - Never  Interpreter Needed?: No  Information entered by ::  Michaele Amundson, CMA   Activities of Daily Living    05/15/2023    2:41 PM  In your present state of health, do you have any difficulty performing the following activities:  Hearing? 0  Vision? 0  Difficulty concentrating or making decisions? 0  Walking or climbing stairs? 1  Comment chronic low back pain  Dressing or bathing? 0  Doing errands, shopping? 0  Comment pt doesn't drive. her husband does the errands or drives her  Preparing Food and eating ? Y  Comment pt doesn't cook. family cooks for her  Using the Toilet? N  In the past six months, have you accidently leaked urine? N  Do you have problems with loss of bowel control? N  Managing your Medications? N  Managing your Finances? N  Housekeeping or managing your Housekeeping? Y  Comment family helps her with this    Patient Care Team: Billie Lade, MD as PCP - General (Internal Medicine) Randa Spike, Kelton Pillar, LCSW as [facility] Care Management (Licensed Clinical Social Worker)  Indicate any recent Medical Services you may have received from other than [facility] providers in the past year (date may be approximate).     Assessment:   This is a routine wellness examination for Jeanmarie.  Hearing/Vision screen Hearing Screening - Comments:: Patient denies any hearing difficulties.   Vision Screening - Comments:: Wears rx glasses - up to date with routine eye exams     Goals Addressed             This Visit's Progress    Patient Stated       Remain as active as possible  Depression Screen     05/15/2023    2:38 PM 04/17/2023    4:21 PM 12/20/2022    4:14 PM 10/26/2022    8:13 AM 10/10/2022    2:15 PM 09/05/2022    4:05 PM 07/04/2022    4:15 PM  PHQ 2/9 Scores  PHQ - 2 Score 0 5 5 6 5  0 2  PHQ- 9 Score 0 11 11 15 9  0 2    Fall Risk    05/15/2023    2:41 PM 04/17/2023    4:20 PM 12/20/2022    4:14 PM 10/26/2022    8:13 AM 10/10/2022    2:15 PM  Fall Risk   Falls in the past year? 0 0 0 0 0  Number falls in past yr: 0 0 0 1   Injury with Fall? 0 0 0 0 0  Risk for fall due to : No Fall Risks No Fall Risks No Fall Risks History of fall(s);Impaired balance/gait No Fall Risks  Follow up Falls prevention discussed Falls evaluation completed Falls evaluation completed Falls evaluation completed Falls evaluation completed    MEDICARE RISK AT HOME: Medicare Risk at Home Any stairs in or around the home?: No If so, are there any without handrails?: No Home free of loose throw rugs in walkways, pet beds, electrical cords, etc?: Yes Adequate lighting in your home to reduce risk of falls?: Yes Life alert?: No Use of a cane, walker or w/c?: No Grab bars in the bathroom?: No Shower chair or bench in shower?: Yes Elevated toilet seat or a handicapped toilet?: No  TIMED UP AND GO:  Was the test performed?  No    Cognitive Function:        05/15/2023    2:37 PM 09/26/2021    4:43 PM 09/14/2020    1:30 PM  6CIT Screen  What Year? 0 points 0 points 0 points  What month? 0 points 0 points 0 points  What time? 0 points 0 points 0 points  Count back from 20 0 points 2 points 0 points  Months in reverse 0 points 0 points 2 points  Repeat phrase 0 points 0 points 0 points  Total Score 0 points 2 points 2 points    Immunizations Immunization History  Administered Date(s) Administered   Fluad Quad(high Dose 65+) 07/27/2020, 08/11/2021, 07/04/2022   Influenza-Unspecified 08/02/2016, 06/26/2017, 07/03/2018   Moderna Sars-Covid-2 Vaccination 10/27/2019, 11/24/2019   Pneumococcal  Conjugate-13 12/29/2013   Pneumococcal Polysaccharide-23 08/02/2016   Zoster Recombinant(Shingrix) 07/03/2018, 08/23/2022   Zoster, Live 06/15/2011    TDAP status: Due, Education has been provided regarding the importance of this vaccine. Advised may receive this vaccine at local pharmacy or Health Dept. Aware to provide a copy of the vaccination record if obtained from local pharmacy or Health Dept. Verbalized acceptance and understanding.  Flu Vaccine status: Due, Education has been provided regarding the importance of this vaccine. Advised may receive this vaccine at local pharmacy or Health Dept. Aware to provide a copy of the vaccination record if obtained from local pharmacy or Health Dept. Verbalized acceptance and understanding.  Pneumococcal vaccine status: Up to date  Covid-19 vaccine status: Information provided on how to obtain vaccines.   Qualifies for Shingles Vaccine? Yes   Zostavax completed Yes   Shingrix Completed?: Yes  Screening Tests Health Maintenance  Topic Date Due   DTaP/Tdap/Td (1 - Tdap) Never done   Fecal DNA (Cologuard)  Never done   COVID-19 Vaccine (3 - Moderna  risk series) 12/22/2019   DEXA SCAN  07/16/2020   MAMMOGRAM  07/05/2022   Medicare Annual Wellness (AWV)  09/26/2022   INFLUENZA VACCINE  03/28/2023   HEMOGLOBIN A1C  06/28/2023   FOOT EXAM  09/06/2023   Diabetic kidney evaluation - eGFR measurement  12/26/2023   Diabetic kidney evaluation - Urine ACR  12/26/2023   OPHTHALMOLOGY EXAM  04/10/2024   Pneumonia Vaccine 70+ Years old  Completed   Hepatitis C Screening  Completed   Zoster Vaccines- Shingrix  Completed   HPV VACCINES  Aged Out    Health Maintenance  Health Maintenance Due  Topic Date Due   DTaP/Tdap/Td (1 - Tdap) Never done   Fecal DNA (Cologuard)  Never done   COVID-19 Vaccine (3 - Moderna risk series) 12/22/2019   DEXA SCAN  07/16/2020   MAMMOGRAM  07/05/2022   Medicare Annual Wellness (AWV)  09/26/2022   INFLUENZA  VACCINE  03/28/2023    Colorectal Cancer Screenings: Cologuard ordered 05/15/2023   Mammogram status: Ordered 05/15/2023. Pt provided with contact info and advised to call to schedule appt.   Bone Density status: Ordered 05/15/2023. Pt provided with contact info and advised to call to schedule appt.  Lung Cancer Screening: (Low Dose CT Chest recommended if Age 46-80 years, 20 pack-year currently smoking OR have quit w/in 15years.) does not qualify.    Additional Screening:  Hepatitis C Screening: does not qualify; Completed 08/11/2021  Vision Screening: Recommended annual ophthalmology exams for early detection of glaucoma and other disorders of the eye. Is the patient up to date with their annual eye exam?  Yes  Who is the provider or what is the name of the office in which the patient attends annual eye exams? [facility]  Dental Screening: Recommended annual dental exams for proper oral hygiene  Diabetic Foot Exam: Diabetic Foot Exam: Completed 09/05/2022  Community Resource Referral / Chronic Care Management: CRR required this visit?  No   CCM required this visit?  No     Plan:     I have personally reviewed and noted the following in the patient's chart:   Medical and social history Use of alcohol, tobacco or illicit drugs  Current medications and supplements including opioid prescriptions. Patient is currently taking opioid prescriptions. Information provided to patient regarding non-opioid alternatives. Patient advised to discuss non-opioid treatment plan with their provider. Functional ability and status Nutritional status Physical activity Advanced directives List of other physicians Hospitalizations, surgeries, and ER visits in previous 12 months Vitals Screenings to include cognitive, depression, and falls Referrals and appointments  In addition, I have reviewed and discussed with patient certain preventive protocols, quality metrics, and best practice  recommendations. A written personalized care plan for preventive services as well as general preventive health recommendations were provided to patient.     Jordan Hawks Lynasia Meloche, CMA   05/15/2023   After Visit Summary: (MyChart) Due to this being a telephonic visit, the after visit summary with patients personalized plan was offered to patient via MyChart

## 2023-05-29 ENCOUNTER — Encounter: Payer: Self-pay | Admitting: Internal Medicine

## 2023-05-29 ENCOUNTER — Other Ambulatory Visit: Payer: Self-pay | Admitting: Family Medicine

## 2023-05-29 ENCOUNTER — Ambulatory Visit: Payer: PPO | Attending: Internal Medicine | Admitting: Internal Medicine

## 2023-05-29 VITALS — BP 118/80 | HR 85 | Resp 16 | Ht 68.0 in | Wt 170.0 lb

## 2023-05-29 DIAGNOSIS — H9311 Tinnitus, right ear: Secondary | ICD-10-CM

## 2023-05-29 DIAGNOSIS — F321 Major depressive disorder, single episode, moderate: Secondary | ICD-10-CM | POA: Diagnosis not present

## 2023-05-29 DIAGNOSIS — D869 Sarcoidosis, unspecified: Secondary | ICD-10-CM

## 2023-05-29 DIAGNOSIS — E559 Vitamin D deficiency, unspecified: Secondary | ICD-10-CM

## 2023-05-29 DIAGNOSIS — N3281 Overactive bladder: Secondary | ICD-10-CM

## 2023-05-29 DIAGNOSIS — F4321 Adjustment disorder with depressed mood: Secondary | ICD-10-CM | POA: Diagnosis not present

## 2023-05-29 DIAGNOSIS — I10 Essential (primary) hypertension: Secondary | ICD-10-CM

## 2023-05-29 NOTE — Progress Notes (Signed)
Office Visit Note  Patient: [name]             Date of Birth: 07-20-1946           MRN: [id number]             PCP: Billie Lade, MD Referring: Billie Lade, MD Visit Date: 05/29/2023   Subjective:  New Patient (Initial Visit) (Patient states she has pain in her lower back. )   History of Present Illness: [name] is a 77 y.o. female here for longstanding history of sarcoidosis and long term use of hydroxychloroquine.  She was apparently diagnosed due to pulmonary involvement many years ago.  Review of available images in the medical record have at times indicated increased hilar adenopathy but without chronic calcified granulomas or other active disease.  Does not have a specific history of skin rashes, joint swelling, or uveitis that she can recall.  She had started hydroxychloroquine treatment continuously since 06-15-2014.  She already stopped this medicine after medical recommendations since sometime in April or May of this year. Her main joint complaint today is of chronic pain in her low back.  She also has bilateral ankle pain with previous fractures and hardware failure due to extremely poor bone density.  She was treated with Fosamax for osteoporosis but has been off this for some time.  She sees [facility] for chronic pain management.  She takes BuSpar, Cymbalta, Lyrica, sertraline, oxycodone, and Robaxin.  Overall has been doing much worse with depressive symptoms since 15-Jun-2021 after the death of her son.  She frequently feels sad and sometimes with decrease in activity and worse pain.    Activities of Daily Living:  Patient reports morning stiffness for 24 hours.   Patient Reports nocturnal pain.  Difficulty dressing/grooming: Denies Difficulty climbing stairs: Reports Difficulty getting out of chair: Denies Difficulty using hands for taps, buttons, cutlery, and/or writing: Denies  Review of Systems  Constitutional:  Positive for fatigue.  HENT:  Positive  for mouth dryness. Negative for mouth sores.   Eyes:  Negative for dryness.  Respiratory:  Negative for shortness of breath.   Cardiovascular:  Negative for chest pain and palpitations.  Gastrointestinal:  Negative for blood in stool, constipation and diarrhea.  Endocrine: Negative for increased urination.  Genitourinary:  Negative for involuntary urination.  Musculoskeletal:  Positive for joint pain, joint pain, joint swelling, myalgias, muscle weakness, morning stiffness, muscle tenderness and myalgias. Negative for gait problem.  Skin:  Negative for color change, rash, hair loss and sensitivity to sunlight.  Allergic/Immunologic: Negative for susceptible to infections.  Neurological:  Positive for headaches. Negative for dizziness.  Hematological:  Negative for swollen glands.  Psychiatric/Behavioral:  Positive for depressed mood and sleep disturbance. The patient is nervous/anxious.     PMFS History:  Patient Active Problem List   Diagnosis Date Noted   Other lesions of oral mucosa 04/18/2023   Colon cancer screening 12/26/2022   Tinnitus 10/26/2022   Sinus congestion 10/16/2022   Viral illness 09/05/2022   Encounter for general adult medical examination with abnormal findings 07/04/2022   CKD (chronic kidney disease) stage 3, GFR 30-59 ml/min (HCC) 12/12/2021   Vitamin D deficiency 12/12/2021   Grief 09/05/2021   Insomnia 08/12/2021   Need for immunization against influenza 08/11/2021   Overactive bladder 10/11/2020   Anxiety 09/13/2020   Gastroesophageal reflux disease 03/03/2020   Laryngopharyngeal reflux (LPR) 03/03/2020   Depression, major, single episode, moderate (HCC) 12/02/2019  Controlled diabetes mellitus type 2 with complications (HCC) 12/02/2019   Hyperlipidemia 12/02/2019   Essential hypertension 12/02/2019   Lumbar radiculopathy 10/31/2019   Polyarthropathy 10/31/2019   Polyneuropathy due to type 2 diabetes mellitus (HCC) 10/31/2019   Chronic pain syndrome  10/31/2019   Bilateral sensorineural hearing loss 02/11/2017   Right ear impacted cerumen 02/11/2017   Sarcoidosis 03/02/2014   Low back pain 03/02/2014    Past Medical History:  Diagnosis Date   Arthritis    Coronary artery disease    Diabetes mellitus without complication (HCC)    Encephalopathy 03/02/2014   Hyperlipidemia    Hypertension    Osteoporosis    Referred otalgia of right ear 02/11/2017   Renal disorder    Sarcoidosis    Throat pain 03/03/2020    Family History  Problem Relation Age of Onset   Hypertension Mother    Hyperlipidemia Mother    Throat cancer Father    Early death Son    Past Surgical History:  Procedure Laterality Date   ANKLE SURGERY     BACK SURGERY     FRACTURE SURGERY     tibia    Social History   Social History Narrative   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)   Lives with husband-Ronnie    Has 3 grown children: two live in town and one out of town   10 grandbabies   8 great grandbabies   Enjoys: reading, talk with friends, church-sing in choir       Diet: eats all food groups   Caffeine: none     Water: 4 cups daily      Wears seat belt   Does not use phone while driving   Smoke detectors at home   Immunization History  Administered Date(s) Administered   Fluad Quad(high Dose 65+) 07/27/2020, 08/11/2021, 07/04/2022   Influenza-Unspecified 08/02/2016, 06/26/2017, 07/03/2018   Moderna Sars-Covid-2 Vaccination 10/27/2019, 11/24/2019   Pneumococcal Conjugate-13 12/29/2013   Pneumococcal Polysaccharide-23 08/02/2016   Zoster Recombinant(Shingrix) 07/03/2018, 08/23/2022   Zoster, Live 06/15/2011     Objective: Vital Signs: BP 118/80 (BP Location: Right Arm, Patient Position: Sitting, Cuff Size: Normal)   Pulse 85   Resp 16   Ht 5\' 8"  (1.727 m)   Wt 170 lb (77.1 kg)   BMI 25.85 kg/m    Physical Exam HENT:     Mouth/Throat:     Mouth: Mucous membranes are dry.     Pharynx: Oropharynx is clear.     Comments: Some flattening of  lingual papillae Eyes:     Conjunctiva/sclera: Conjunctivae normal.  Cardiovascular:     Rate and Rhythm: Normal rate and regular rhythm.  Pulmonary:     Effort: Pulmonary effort is normal.     Breath sounds: Normal breath sounds.  Lymphadenopathy:     Cervical: No cervical adenopathy.  Skin:    General: Skin is warm and dry.     Findings: No rash.  Neurological:     Mental Status: She is alert.  Psychiatric:        Mood and Affect: Mood normal.      Musculoskeletal Exam:  Shoulders difficulty reaching overhead but passive ROM intact Elbows full ROM no tenderness or swelling Wrists full ROM no tenderness or swelling Fingers full ROM no tenderness or swelling Surgical scar over lumbar spine, minimal tenderness to pressure Hip normal internal and external rotation without pain, no tenderness to lateral hip palpation Knees full ROM no tenderness or swelling, patellofemoral crepitus b/l  Ankles with lateral surgical scars and decreased ROM, nonpitting swelling over both ankles, no erythema  Investigation: No additional findings.  Imaging: No results found.  Recent Labs: Lab Results  Component Value Date   WBC 5.7 12/26/2022   HGB 12.9 12/26/2022   PLT 251 12/26/2022   NA 141 12/26/2022   K 4.3 12/26/2022   CL 103 12/26/2022   CO2 21 12/26/2022   GLUCOSE 194 (H) 12/26/2022   BUN 16 12/26/2022   CREATININE 1.12 (H) 12/26/2022   BILITOT 0.3 12/26/2022   ALKPHOS 52 12/26/2022   AST 20 12/26/2022   ALT 17 12/26/2022   PROT 7.0 12/26/2022   ALBUMIN 4.4 12/26/2022   CALCIUM 9.4 12/26/2022   GFRAA 43 (L) 02/25/2020    Speciality Comments: No specialty comments available.  Procedures:  No procedures performed Allergies: Darvon [propoxyphene], Morphine and codeine, and Penicillins   Assessment / Plan:     Visit Diagnoses: Sarcoidosis - Plan: Sedimentation rate, C-reactive protein, Angiotensin converting enzyme, Rheumatoid factor  History of pulmonary sarcoidosis  unclear if there is any continued disease activity at this point.  She has pain chronically in multiple levels with generalized osteoarthritis as well as some chronic pain sensitization based on assessment and her pain management history.  Will check serum inflammatory markers ACE level rheumatoid factor for any evidence of active systemic disease.  I suspect more likely that this disease is in remission.  If results are abnormal but there is concern for ophthalmology toxicity from hydroxychloroquine we will consider alternate steroid sparing DMARD.  Depression, major, single episode, moderate (HCC) Grief  She expresses symptoms of grief and depression which is very prolonged for bereavement after her son passed away in 07/07/21.  Does not express any self harming thoughts.  Does not give any indications for pseudodementia.  Could be contributing to an increase in her overall pain and worse functional level.  Tinnitus of right ear  Not clear why she has this current issue.  From a quick review does not have severely toxic medications in her list is only on 40 mg once daily of furosemide.  I recommended she could see an ENT specialist to better evaluate this if persistent and getting worse.  Vitamin D deficiency - Plan: VITAMIN D 25 Hydroxy (Vit-D Deficiency, Fractures)  Checking vitamin D given history of severe osteoporosis also screening for abnormal levels with questionable sarcoidosis activity.  Orders: Orders Placed This Encounter  Procedures   Sedimentation rate   C-reactive protein   VITAMIN D 25 Hydroxy (Vit-D Deficiency, Fractures)   Angiotensin converting enzyme   Rheumatoid factor   No orders of the defined types were placed in this encounter.   Follow-Up Instructions: No follow-ups on file.   Fuller Plan, MD  Note - This record has been created using [employer].  Chart creation errors have been sought, but may not always  have been located. Such creation errors  do not reflect on  the standard of medical care.

## 2023-05-30 ENCOUNTER — Other Ambulatory Visit: Payer: Self-pay | Admitting: Family Medicine

## 2023-05-30 DIAGNOSIS — Z79899 Other long term (current) drug therapy: Secondary | ICD-10-CM | POA: Diagnosis not present

## 2023-05-30 DIAGNOSIS — N3281 Overactive bladder: Secondary | ICD-10-CM

## 2023-05-30 DIAGNOSIS — M5416 Radiculopathy, lumbar region: Secondary | ICD-10-CM | POA: Diagnosis not present

## 2023-05-30 DIAGNOSIS — E663 Overweight: Secondary | ICD-10-CM | POA: Diagnosis not present

## 2023-05-30 DIAGNOSIS — E119 Type 2 diabetes mellitus without complications: Secondary | ICD-10-CM | POA: Diagnosis not present

## 2023-05-30 DIAGNOSIS — G894 Chronic pain syndrome: Secondary | ICD-10-CM | POA: Diagnosis not present

## 2023-05-30 DIAGNOSIS — I10 Essential (primary) hypertension: Secondary | ICD-10-CM

## 2023-05-30 DIAGNOSIS — M961 Postlaminectomy syndrome, not elsewhere classified: Secondary | ICD-10-CM | POA: Diagnosis not present

## 2023-05-30 DIAGNOSIS — N183 Chronic kidney disease, stage 3 unspecified: Secondary | ICD-10-CM | POA: Diagnosis not present

## 2023-05-30 LAB — ANGIOTENSIN CONVERTING ENZYME: Angiotensin-Converting Enzyme: 5 U/L — ABNORMAL LOW (ref 9–67)

## 2023-05-30 LAB — SEDIMENTATION RATE: Sed Rate: 11 mm/h (ref 0–30)

## 2023-05-30 LAB — C-REACTIVE PROTEIN: CRP: 7.7 mg/L (ref <8.0)

## 2023-05-30 LAB — RHEUMATOID FACTOR: Rheumatoid fact SerPl-aCnc: <10 [IU]/mL (ref <14)

## 2023-05-30 LAB — VITAMIN D 25 HYDROXY (VIT D DEFICIENCY, FRACTURES): Vit D, 25-Hydroxy: 38 ng/mL (ref 30–100)

## 2023-06-11 ENCOUNTER — Other Ambulatory Visit: Payer: Self-pay | Admitting: Internal Medicine

## 2023-06-11 DIAGNOSIS — F419 Anxiety disorder, unspecified: Secondary | ICD-10-CM

## 2023-06-12 ENCOUNTER — Other Ambulatory Visit: Payer: Self-pay | Admitting: Internal Medicine

## 2023-06-12 DIAGNOSIS — F419 Anxiety disorder, unspecified: Secondary | ICD-10-CM

## 2023-06-24 ENCOUNTER — Ambulatory Visit (HOSPITAL_COMMUNITY): Payer: PPO

## 2023-06-26 ENCOUNTER — Ambulatory Visit (HOSPITAL_COMMUNITY)
Admission: RE | Admit: 2023-06-26 | Discharge: 2023-06-26 | Disposition: A | Discharge status: Home / Self Care | Payer: PPO | Source: Ambulatory Visit | Attending: Internal Medicine | Admitting: Internal Medicine

## 2023-06-26 ENCOUNTER — Encounter (HOSPITAL_COMMUNITY): Payer: Self-pay

## 2023-06-26 DIAGNOSIS — Z0000 Encounter for general adult medical examination without abnormal findings: Secondary | ICD-10-CM

## 2023-06-26 DIAGNOSIS — Z1231 Encounter for screening mammogram for malignant neoplasm of breast: Secondary | ICD-10-CM | POA: Diagnosis not present

## 2023-06-26 DIAGNOSIS — M810 Age-related osteoporosis without current pathological fracture: Secondary | ICD-10-CM | POA: Diagnosis not present

## 2023-06-26 DIAGNOSIS — Z780 Asymptomatic menopausal state: Secondary | ICD-10-CM | POA: Diagnosis not present

## 2023-06-27 ENCOUNTER — Other Ambulatory Visit: Payer: Self-pay | Admitting: Internal Medicine

## 2023-06-27 DIAGNOSIS — E118 Type 2 diabetes mellitus with unspecified complications: Secondary | ICD-10-CM

## 2023-06-29 DIAGNOSIS — Z1211 Encounter for screening for malignant neoplasm of colon: Secondary | ICD-10-CM | POA: Diagnosis not present

## 2023-07-01 DIAGNOSIS — Z0130 Encounter for examination of blood pressure without abnormal findings: Secondary | ICD-10-CM | POA: Diagnosis not present

## 2023-07-01 DIAGNOSIS — M961 Postlaminectomy syndrome, not elsewhere classified: Secondary | ICD-10-CM | POA: Diagnosis not present

## 2023-07-01 DIAGNOSIS — N1831 Chronic kidney disease, stage 3a: Secondary | ICD-10-CM | POA: Diagnosis not present

## 2023-07-01 DIAGNOSIS — Z79899 Other long term (current) drug therapy: Secondary | ICD-10-CM | POA: Diagnosis not present

## 2023-07-01 DIAGNOSIS — M792 Neuralgia and neuritis, unspecified: Secondary | ICD-10-CM | POA: Diagnosis not present

## 2023-07-01 DIAGNOSIS — E119 Type 2 diabetes mellitus without complications: Secondary | ICD-10-CM | POA: Diagnosis not present

## 2023-07-01 DIAGNOSIS — M5416 Radiculopathy, lumbar region: Secondary | ICD-10-CM | POA: Diagnosis not present

## 2023-07-01 DIAGNOSIS — D869 Sarcoidosis, unspecified: Secondary | ICD-10-CM | POA: Diagnosis not present

## 2023-07-04 ENCOUNTER — Other Ambulatory Visit: Payer: Self-pay | Admitting: Internal Medicine

## 2023-07-04 DIAGNOSIS — N3281 Overactive bladder: Secondary | ICD-10-CM

## 2023-07-08 ENCOUNTER — Ambulatory Visit: Payer: PPO | Attending: Internal Medicine | Admitting: Internal Medicine

## 2023-07-08 ENCOUNTER — Encounter: Payer: Self-pay | Admitting: Internal Medicine

## 2023-07-08 ENCOUNTER — Other Ambulatory Visit: Payer: Self-pay | Admitting: Internal Medicine

## 2023-07-08 VITALS — BP 116/77 | HR 88 | Resp 14 | Ht 68.0 in | Wt 171.0 lb

## 2023-07-08 DIAGNOSIS — R233 Spontaneous ecchymoses: Secondary | ICD-10-CM | POA: Diagnosis not present

## 2023-07-08 DIAGNOSIS — M130 Polyarthritis, unspecified: Secondary | ICD-10-CM | POA: Diagnosis not present

## 2023-07-08 DIAGNOSIS — E1142 Type 2 diabetes mellitus with diabetic polyneuropathy: Secondary | ICD-10-CM | POA: Diagnosis not present

## 2023-07-08 DIAGNOSIS — D869 Sarcoidosis, unspecified: Secondary | ICD-10-CM

## 2023-07-08 DIAGNOSIS — G5603 Carpal tunnel syndrome, bilateral upper limbs: Secondary | ICD-10-CM | POA: Diagnosis not present

## 2023-07-08 NOTE — Progress Notes (Signed)
Office Visit Note  Patient: [name]             Date of Birth: [birth year]           MRN: [id number]             PCP: Billie Lade, MD Referring: Billie Lade, MD Visit Date: 07/08/2023   Subjective:  Follow-up (Patient states she is still in a lot of pain. Patient states she has been getting bruises on her arms and legs and she does not know where they are coming from. )   History of Present Illness: [name] is a 77 y.o. female here for follow up for joint pains with history of sarcoidosis.  Lab testing in our initial visit was unremarkable with no evidence of systemic inflammation, low ACE level, and normal vitamin D.  She has not noticed any new or progressive symptoms despite more time off hydroxychloroquine.  She is experiencing easy or spontaneous bruising notices this on her forearms and on her thighs.  Also noticing more frequent numbness in her hands often in the morning or intermittent during day time. But not specifically change in grip strength or dropping anything unintentionally.  Previous HPI 05/29/23 [name] is a 77 y.o. female here for longstanding history of sarcoidosis and long term use of hydroxychloroquine.  She was apparently diagnosed due to pulmonary involvement many years ago.  Review of available images in the medical record have at times indicated increased hilar adenopathy but without chronic calcified granulomas or other active disease.  Does not have a specific history of skin rashes, joint swelling, or uveitis that she can recall.  She had started hydroxychloroquine treatment continuously since 2014-08-03.  She already stopped this medicine after medical recommendations since sometime in April or May of this year. Her main joint complaint today is of chronic pain in her low back.  She also has bilateral ankle pain with previous fractures and hardware failure due to extremely poor bone density.  She was treated with Fosamax for osteoporosis but has  been off this for some time.  She sees [facility] for chronic pain management.  She takes BuSpar, Cymbalta, Lyrica, sertraline, oxycodone, and Robaxin.  Overall has been doing much worse with depressive symptoms since 08/03/21 after the death of her son.  She frequently feels sad and sometimes with decrease in activity and worse pain.   Review of Systems  Constitutional:  Positive for fatigue.  HENT:  Positive for mouth dryness. Negative for mouth sores.   Eyes:  Negative for dryness.  Respiratory:  Negative for shortness of breath.   Cardiovascular:  Negative for chest pain and palpitations.  Gastrointestinal:  Positive for constipation. Negative for blood in stool and diarrhea.  Endocrine: Negative for increased urination.  Genitourinary:  Negative for involuntary urination.  Musculoskeletal:  Positive for joint pain, gait problem, joint pain, myalgias, muscle weakness, morning stiffness, muscle tenderness and myalgias. Negative for joint swelling.  Skin:  Negative for color change, rash, hair loss and sensitivity to sunlight.  Allergic/Immunologic: Negative for susceptible to infections.  Neurological:  Positive for headaches. Negative for dizziness.  Hematological:  Negative for swollen glands.  Psychiatric/Behavioral:  Positive for depressed mood and sleep disturbance. The patient is nervous/anxious.     PMFS History:  Patient Active Problem List   Diagnosis Date Noted   Bilateral carpal tunnel syndrome 07/08/2023   Easy bruising 07/08/2023   Other lesions of oral mucosa 04/18/2023  Colon cancer screening 12/26/2022   Tinnitus 10/26/2022   Sinus congestion 10/16/2022   Viral illness 09/05/2022   Encounter for general adult medical examination with abnormal findings 07/04/2022   CKD (chronic kidney disease) stage 3, GFR 30-59 ml/min (HCC) 12/12/2021   Vitamin D deficiency 12/12/2021   Grief 09/05/2021   Insomnia 08/12/2021   Need for immunization against influenza 08/11/2021    Overactive bladder 10/11/2020   Anxiety 09/13/2020   Gastroesophageal reflux disease 03/03/2020   Laryngopharyngeal reflux (LPR) 03/03/2020   Depression, major, single episode, moderate (HCC) 12/02/2019   Controlled diabetes mellitus type 2 with complications (HCC) 12/02/2019   Hyperlipidemia 12/02/2019   Essential hypertension 12/02/2019   Lumbar radiculopathy 10/31/2019   Polyarthropathy 10/31/2019   Polyneuropathy due to type 2 diabetes mellitus (HCC) 10/31/2019   Chronic pain syndrome 10/31/2019   Bilateral sensorineural hearing loss 02/11/2017   Right ear impacted cerumen 02/11/2017   Sarcoidosis 03/02/2014   Low back pain 03/02/2014    Past Medical History:  Diagnosis Date   Arthritis    Coronary artery disease    Diabetes mellitus without complication (HCC)    Encephalopathy 03/02/2014   Hyperlipidemia    Hypertension    Osteoporosis    Referred otalgia of right ear 02/11/2017   Renal disorder    Sarcoidosis    Throat pain 03/03/2020    Family History  Problem Relation Age of Onset   Hypertension Mother    Hyperlipidemia Mother    Throat cancer Father    Early death Son    Past Surgical History:  Procedure Laterality Date   ANKLE SURGERY     BACK SURGERY     FRACTURE SURGERY     tibia    Social History   Social History Narrative   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)   Lives with husband-Ronnie    Has 3 grown children: two live in town and one out of town   10 grandbabies   8 great grandbabies   Enjoys: reading, talk with friends, church-sing in choir       Diet: eats all food groups   Caffeine: none     Water: 4 cups daily      Wears seat belt   Does not use phone while driving   Psychologist, sport and exercise at home   Immunization History  Administered Date(s) Administered   Fluad Quad(high Dose 65+) 07/27/2020, 08/11/2021, 07/04/2022   Influenza-Unspecified 08/02/2016, 06/26/2017, 07/03/2018   Moderna Sars-Covid-2 Vaccination 10/27/2019, 11/24/2019    Pneumococcal Conjugate-13 12/29/2013   Pneumococcal Polysaccharide-23 08/02/2016   Zoster Recombinant(Shingrix) 07/03/2018, 08/23/2022   Zoster, Live 06/15/2011     Objective: Vital Signs: BP 116/77 (BP Location: Left Arm, Patient Position: Sitting, Cuff Size: Normal)   Pulse 88   Resp 14   Ht 5\' 8"  (1.727 m)   Wt 171 lb (77.6 kg)   BMI 26.00 kg/m    Physical Exam Eyes:     Conjunctiva/sclera: Conjunctivae normal.  Cardiovascular:     Rate and Rhythm: Normal rate and regular rhythm.  Pulmonary:     Effort: Pulmonary effort is normal.     Breath sounds: Normal breath sounds.  Lymphadenopathy:     Cervical: No cervical adenopathy.  Skin:    General: Skin is warm and dry.     Findings: Bruising present.     Comments: Small bruises on both forearm extensor surfaces  Neurological:     Mental Status: She is alert.  Psychiatric:  Mood and Affect: Mood normal.      Musculoskeletal Exam:  Shoulders difficulty reaching overhead but passive ROM intact Elbows full ROM no tenderness or swelling Wrists full ROM, no swelling, positive tinel's sign Fingers full ROM no tenderness or swelling Surgical scar over lumbar spine, minimal tenderness to pressure Hip normal internal and external rotation without pain, no tenderness to lateral hip palpation Knees full ROM no tenderness or swelling, patellofemoral crepitus b/l  Investigation: No additional findings.  Imaging: MM 3D SCREENING MAMMOGRAM BILATERAL BREAST  Result Date: 06/28/2023 CLINICAL DATA:  Screening. EXAM: DIGITAL SCREENING BILATERAL MAMMOGRAM WITH TOMOSYNTHESIS AND CAD TECHNIQUE: Bilateral screening digital craniocaudal and mediolateral oblique mammograms were obtained. Bilateral screening digital breast tomosynthesis was performed. The images were evaluated with computer-aided detection. COMPARISON:  Previous exam(s). ACR Breast Density Category c: The breasts are heterogeneously dense, which may obscure small  masses. FINDINGS: There are no findings suspicious for malignancy. IMPRESSION: No mammographic evidence of malignancy. A result letter of this screening mammogram will be mailed directly to the patient. RECOMMENDATION: Screening mammogram in one year. (Code:SM-B-01Y) BI-RADS CATEGORY  1: Negative. Electronically Signed   By: Amie Portland M.D.   On: 06/28/2023 15:33   DG Bone Density  Result Date: 06/26/2023 EXAM: DUAL X-RAY ABSORPTIOMETRY (DXA) FOR BONE MINERAL DENSITY IMPRESSION: Your patient [name] completed a BMD test on 06/26/2023 using the [employer] DXA System (software version: 14.10) manufactured by [employer]. The following summarizes the results of our evaluation. Technologist: CH/ [facility] PATIENT BIOGRAPHICAL: Name: [name], [name] Patient ID: [id number] Birth Date: 06-15-1946 Height: 68.0 in. Gender: Female Exam Date: 06/26/2023 Weight: 170.0 lbs. Indications: Back surgery, Early Menopause, Follow up Osteopenia, Height Loss, History of Fracture (Adult), Post Menopausal, Renal Fractures: Tib Fib, Ankle Treatments: Vitamin D DENSITOMETRY RESULTS: Site         Region     Measured Date Measured Age WHO Classification Young Adult T-score BMD         %Change vs. Previous Significant Change (*) DualFemur Neck Left 06/26/2023 75.1 Osteoporosis -3.0 0.619 g/cm2 -12.8% Yes DualFemur Neck Left 07/16/2018 70.1 Osteopenia -2.4 0.710 g/cm2 -2.3% - DualFemur Neck Left 05/30/2016 68.0 Osteopenia -2.2 0.727 g/cm2 0.6% - DualFemur Neck Left 11/19/2013 65.5 Osteopenia -2.3 0.723 g/cm2 2.1% - DualFemur Neck Left 07/06/2011 63.1 Osteopenia -2.4 0.708 g/cm2 - - DualFemur Total Mean 06/26/2023 75.1 Osteoporosis -2.7 0.669 g/cm2 -13.3% Yes DualFemur Total Mean 07/16/2018 70.1 - - 0.772 g/cm2 -2.6% - DualFemur Total Mean 05/30/2016 68.0 - - 0.793 g/cm2 1.7% - DualFemur Total Mean 11/19/2013 65.5 Osteopenia -1.8 0.780 g/cm2 -0.1% - DualFemur Total Mean 07/06/2011 63.1 Osteopenia -1.8 0.781 g/cm2 - - Left  Forearm Radius 33% 06/26/2023 75.1 Osteoporosis -2.7 0.519 g/cm2 -6.7% - Left Forearm Radius 33% 07/16/2018 70.1 Osteopenia -2.2 0.555 g/cm2 -0.4% - Left Forearm Radius 33% 05/30/2016 68.0 Osteopenia -2.2 0.558 g/cm2 6.8% - Left Forearm Radius 33% 11/19/2013 65.5 Osteoporosis -2.7 0.522 g/cm2 -16.8% Yes Left Forearm Radius 33% 07/06/2011 63.1 Osteopenia -1.2 0.628 g/cm2 - - ASSESSMENT: The BMD measured at Femur Neck Left is 0.619 g/cm2 with a T-score of -3.0. This patient is considered osteoporotic according to World Health Organization [facility]) criteria. The scan quality is good. Lumbar spine was excluded due to advanced degenerative changes. Compared with the prior study on 07/16/2018 , the BMD of the total mean shows a statistically significant decrease. World Health Organization [facility]) criteria for post-menopausal, Caucasian Women: Normal:       T-score at or above -1  SD Osteopenia:   T-score between -1 and -2.5 SD Osteoporosis: T-score at or below -2.5 SD RECOMMENDATIONS: 1. All patients should optimize calcium and vitamin D intake. 2. Consider FDA-approved medical therapies in postmenopausal women and med aged 97 years and older, based on the following: a. A hip or vertebral (clinical or morphometric) fracture b. T-score< -2.5 at the femoral neck or spine after appropriate evaluation to exclude secondary causes c. Low bone mass (T-score between -1.0 and -2.5 at the femoral neck or spine) and a 10-year probability of a hip fracture > 3% or a 10-year probability of a major osteoporosis-related fracture > 20% based on the [place]-adapted WHO algorithm d. Clinician judgment and/or patient preferences may indicate treatment for people with 10-year fracture probabilities above or below these levels FOLLOW-UP: Patients with diagnosis of osteoporsis or at high risk for fracture should have regular bone mineral density tests. For patients eligible for Medicare, routine testing is allowed once every 2 years. The testing  frequency can be increased to one year for patients who have rapidly progressing disease, those who are receiving or discontinuing medical therapy to restore bone mass, or have additional risk factors. I have reviewed this report, and agree with the above findings. [facility] Electronically Signed   By: Baird Lyons M.D.   On: 06/26/2023 12:28    Recent Labs: Lab Results  Component Value Date   WBC 5.7 12/26/2022   HGB 12.9 12/26/2022   PLT 251 12/26/2022   NA 141 12/26/2022   K 4.3 12/26/2022   CL 103 12/26/2022   CO2 21 12/26/2022   GLUCOSE 194 (H) 12/26/2022   BUN 16 12/26/2022   CREATININE 1.12 (H) 12/26/2022   BILITOT 0.3 12/26/2022   ALKPHOS 52 12/26/2022   AST 20 12/26/2022   ALT 17 12/26/2022   PROT 7.0 12/26/2022   ALBUMIN 4.4 12/26/2022   CALCIUM 9.4 12/26/2022   GFRAA 43 (L) 02/25/2020    Speciality Comments: No specialty comments available.  Procedures:  No procedures performed Allergies: Darvon [propoxyphene], Morphine and codeine, and Penicillins   Assessment / Plan:     Visit Diagnoses: Sarcoidosis  No evidence of active disease at this time on repeat physical exam or based on her lab results.  Okay to continue off of any immunomodulatory drug.  Recommend follow-up in 6 months though for monitoring.  Polyarthropathy  Widespread pain in multiple areas does have extensive osteoarthritis.  She is now seeing Dr. Thompson Grayer for this and agree with multimodal treatments. Still probably some exacerbation with stressors 2/2 family deaths.  Polyneuropathy due to type 2 diabetes mellitus (HCC) Bilateral carpal tunnel syndrome  Hand numbness and positive Tinel sign on exam are suggestive for carpal tunnel syndrome.  However also with history of peripheral neuropathy due to diabetes.  May be multifactorial.  Provided with range of motion exercises for carpal tunnel syndrome for now.  I discussed if this gets worse we could try treating with ultrasound-guided  steroid injection.  Easy bruising  Complains of easy bruising I see only a few small bruises and knees are on distal forearm extensor surfaces which seem very likely to be from minor or unnoticed superficial trauma.  No red flags to me.  Hydroxychloroquine could be associated with mild effect on coagulation but she is off this medication.  Most recent blood count was also normal.  Orders: No orders of the defined types were placed in this encounter.  No orders of the defined types were placed in this  encounter.    Follow-Up Instructions: Return in about 6 months (around 01/05/2024) for Sarcoidosis obs f/u 6mos.   Fuller Plan, MD  Note - This record has been created using [employer].  Chart creation errors have been sought, but may not always  have been located. Such creation errors do not reflect on  the standard of medical care.

## 2023-07-09 LAB — COLOGUARD: COLOGUARD: NEGATIVE

## 2023-07-10 ENCOUNTER — Other Ambulatory Visit: Payer: Self-pay | Admitting: Internal Medicine

## 2023-07-10 DIAGNOSIS — F419 Anxiety disorder, unspecified: Secondary | ICD-10-CM

## 2023-07-11 ENCOUNTER — Other Ambulatory Visit: Payer: Self-pay | Admitting: Internal Medicine

## 2023-07-11 DIAGNOSIS — F419 Anxiety disorder, unspecified: Secondary | ICD-10-CM

## 2023-07-16 ENCOUNTER — Other Ambulatory Visit: Payer: Self-pay | Admitting: Internal Medicine

## 2023-07-18 ENCOUNTER — Other Ambulatory Visit: Payer: Self-pay | Admitting: Internal Medicine

## 2023-07-31 DIAGNOSIS — N183 Chronic kidney disease, stage 3 unspecified: Secondary | ICD-10-CM | POA: Diagnosis not present

## 2023-07-31 DIAGNOSIS — E119 Type 2 diabetes mellitus without complications: Secondary | ICD-10-CM | POA: Diagnosis not present

## 2023-07-31 DIAGNOSIS — Z79899 Other long term (current) drug therapy: Secondary | ICD-10-CM | POA: Diagnosis not present

## 2023-07-31 DIAGNOSIS — M5416 Radiculopathy, lumbar region: Secondary | ICD-10-CM | POA: Diagnosis not present

## 2023-07-31 DIAGNOSIS — G894 Chronic pain syndrome: Secondary | ICD-10-CM | POA: Diagnosis not present

## 2023-07-31 DIAGNOSIS — E663 Overweight: Secondary | ICD-10-CM | POA: Diagnosis not present

## 2023-07-31 DIAGNOSIS — D869 Sarcoidosis, unspecified: Secondary | ICD-10-CM | POA: Diagnosis not present

## 2023-08-08 ENCOUNTER — Other Ambulatory Visit: Payer: Self-pay | Admitting: Internal Medicine

## 2023-08-08 DIAGNOSIS — F419 Anxiety disorder, unspecified: Secondary | ICD-10-CM

## 2023-08-08 MED ORDER — BUSPIRONE HCL 7.5 MG PO TABS: 7.5000 mg | ORAL_TABLET | Freq: Two times a day (BID) | ORAL | 0 refills | Status: DC

## 2023-08-28 ENCOUNTER — Other Ambulatory Visit: Payer: Self-pay | Admitting: Family Medicine

## 2023-08-28 DIAGNOSIS — I10 Essential (primary) hypertension: Secondary | ICD-10-CM

## 2023-08-29 MED ORDER — FUROSEMIDE 40 MG PO TABS
40.0000 mg | ORAL_TABLET | Freq: Every day | ORAL | 0 refills | Status: DC
Start: 2023-08-29 — End: 2023-11-27

## 2023-08-30 DIAGNOSIS — E119 Type 2 diabetes mellitus without complications: Secondary | ICD-10-CM | POA: Diagnosis not present

## 2023-08-30 DIAGNOSIS — Z79899 Other long term (current) drug therapy: Secondary | ICD-10-CM | POA: Diagnosis not present

## 2023-08-30 DIAGNOSIS — N183 Chronic kidney disease, stage 3 unspecified: Secondary | ICD-10-CM | POA: Diagnosis not present

## 2023-08-30 DIAGNOSIS — G894 Chronic pain syndrome: Secondary | ICD-10-CM | POA: Diagnosis not present

## 2023-08-30 DIAGNOSIS — M5416 Radiculopathy, lumbar region: Secondary | ICD-10-CM | POA: Diagnosis not present

## 2023-08-30 DIAGNOSIS — D869 Sarcoidosis, unspecified: Secondary | ICD-10-CM | POA: Diagnosis not present

## 2023-08-30 DIAGNOSIS — E663 Overweight: Secondary | ICD-10-CM | POA: Diagnosis not present

## 2023-09-09 ENCOUNTER — Other Ambulatory Visit: Payer: Self-pay | Admitting: Internal Medicine

## 2023-09-09 DIAGNOSIS — Z13220 Encounter for screening for lipoid disorders: Secondary | ICD-10-CM | POA: Diagnosis not present

## 2023-09-09 DIAGNOSIS — F419 Anxiety disorder, unspecified: Secondary | ICD-10-CM

## 2023-09-09 DIAGNOSIS — D869 Sarcoidosis, unspecified: Secondary | ICD-10-CM | POA: Diagnosis not present

## 2023-09-16 ENCOUNTER — Other Ambulatory Visit: Payer: Self-pay | Admitting: Internal Medicine

## 2023-09-16 ENCOUNTER — Other Ambulatory Visit: Payer: Self-pay | Admitting: Family Medicine

## 2023-09-16 DIAGNOSIS — E118 Type 2 diabetes mellitus with unspecified complications: Secondary | ICD-10-CM

## 2023-09-16 MED ORDER — GLIPIZIDE ER 5 MG PO TB24: 5.0000 mg | ORAL_TABLET | Freq: Every day | ORAL | Status: DC

## 2023-09-18 ENCOUNTER — Other Ambulatory Visit: Payer: Self-pay | Admitting: Internal Medicine

## 2023-09-18 ENCOUNTER — Other Ambulatory Visit: Payer: Self-pay | Admitting: Family Medicine

## 2023-09-18 DIAGNOSIS — E118 Type 2 diabetes mellitus with unspecified complications: Secondary | ICD-10-CM

## 2023-09-18 DIAGNOSIS — M5416 Radiculopathy, lumbar region: Secondary | ICD-10-CM

## 2023-09-18 NOTE — Telephone Encounter (Unsigned)
Copied from CRM [id number]. Topic: Clinical - Prescription Issue >> Sep 18, 2023  1:53 PM Fredrica W wrote: Reason for CRM: Patient called stated she is having issue filling prescription for glipiZIDE (GLUCOTROL XL) 5 MG 24 hr tablet. She also states there may be an issue because her cardiologist Toni Amend at [facility] had suggested that that medication be cut in half so she is not sure if that is why she is not able to fill. Also states the one that was called in today for methocarbamol (ROBAXIN) 500 MG tablet is usually sent by Pain Management. She is out of glipiZIDE (GLUCOTROL XL) 5 MG 24 hr tablet and would like to see if someone can reach pharmacy to figure out what is going on . Thank You

## 2023-09-19 ENCOUNTER — Other Ambulatory Visit: Payer: Self-pay | Admitting: Family Medicine

## 2023-09-19 DIAGNOSIS — E118 Type 2 diabetes mellitus with unspecified complications: Secondary | ICD-10-CM

## 2023-09-27 DIAGNOSIS — G894 Chronic pain syndrome: Secondary | ICD-10-CM | POA: Diagnosis not present

## 2023-09-27 DIAGNOSIS — M47816 Spondylosis without myelopathy or radiculopathy, lumbar region: Secondary | ICD-10-CM | POA: Diagnosis not present

## 2023-09-27 DIAGNOSIS — Z981 Arthrodesis status: Secondary | ICD-10-CM | POA: Diagnosis not present

## 2023-09-27 DIAGNOSIS — M961 Postlaminectomy syndrome, not elsewhere classified: Secondary | ICD-10-CM | POA: Diagnosis not present

## 2023-09-27 DIAGNOSIS — M545 Low back pain, unspecified: Secondary | ICD-10-CM | POA: Diagnosis not present

## 2023-09-27 DIAGNOSIS — G8929 Other chronic pain: Secondary | ICD-10-CM | POA: Diagnosis not present

## 2023-10-01 DIAGNOSIS — Z0000 Encounter for general adult medical examination without abnormal findings: Secondary | ICD-10-CM | POA: Diagnosis not present

## 2023-10-01 DIAGNOSIS — E663 Overweight: Secondary | ICD-10-CM | POA: Diagnosis not present

## 2023-10-01 DIAGNOSIS — M5416 Radiculopathy, lumbar region: Secondary | ICD-10-CM | POA: Diagnosis not present

## 2023-10-01 DIAGNOSIS — N183 Chronic kidney disease, stage 3 unspecified: Secondary | ICD-10-CM | POA: Diagnosis not present

## 2023-10-01 DIAGNOSIS — D869 Sarcoidosis, unspecified: Secondary | ICD-10-CM | POA: Diagnosis not present

## 2023-10-01 DIAGNOSIS — E119 Type 2 diabetes mellitus without complications: Secondary | ICD-10-CM | POA: Diagnosis not present

## 2023-10-01 DIAGNOSIS — Z79899 Other long term (current) drug therapy: Secondary | ICD-10-CM | POA: Diagnosis not present

## 2023-10-01 DIAGNOSIS — G894 Chronic pain syndrome: Secondary | ICD-10-CM | POA: Diagnosis not present

## 2023-10-02 ENCOUNTER — Other Ambulatory Visit: Payer: Self-pay | Admitting: Internal Medicine

## 2023-10-02 DIAGNOSIS — N3281 Overactive bladder: Secondary | ICD-10-CM

## 2023-10-03 MED ORDER — OXYBUTYNIN CHLORIDE 5 MG PO TABS: ORAL_TABLET | ORAL | 0 refills | Status: DC

## 2023-10-06 ENCOUNTER — Other Ambulatory Visit: Payer: Self-pay | Admitting: Internal Medicine

## 2023-10-07 ENCOUNTER — Other Ambulatory Visit: Payer: Self-pay | Admitting: Internal Medicine

## 2023-10-07 DIAGNOSIS — F419 Anxiety disorder, unspecified: Secondary | ICD-10-CM

## 2023-10-07 MED ORDER — SERTRALINE HCL 100 MG PO TABS: 100.0000 mg | ORAL_TABLET | Freq: Every day | ORAL | 0 refills | Status: DC

## 2023-10-08 MED ORDER — BUSPIRONE HCL 7.5 MG PO TABS: 7.5000 mg | ORAL_TABLET | Freq: Two times a day (BID) | ORAL | 0 refills | Status: DC

## 2023-10-13 ENCOUNTER — Other Ambulatory Visit: Payer: Self-pay | Admitting: Internal Medicine

## 2023-10-14 ENCOUNTER — Other Ambulatory Visit: Payer: Self-pay | Admitting: Internal Medicine

## 2023-10-14 MED ORDER — EZETIMIBE 10 MG PO TABS: 10.0000 mg | ORAL_TABLET | Freq: Every day | ORAL | 0 refills | Status: DC

## 2023-10-16 ENCOUNTER — Other Ambulatory Visit: Payer: Self-pay | Admitting: Internal Medicine

## 2023-10-16 DIAGNOSIS — M5416 Radiculopathy, lumbar region: Secondary | ICD-10-CM

## 2023-10-17 ENCOUNTER — Other Ambulatory Visit: Payer: Self-pay | Admitting: Internal Medicine

## 2023-10-18 MED ORDER — LISINOPRIL 2.5 MG PO TABS: ORAL_TABLET | ORAL | 0 refills | Status: DC

## 2023-10-25 ENCOUNTER — Other Ambulatory Visit: Payer: Self-pay | Admitting: Internal Medicine

## 2023-10-25 DIAGNOSIS — K219 Gastro-esophageal reflux disease without esophagitis: Secondary | ICD-10-CM

## 2023-10-30 DIAGNOSIS — M5416 Radiculopathy, lumbar region: Secondary | ICD-10-CM | POA: Diagnosis not present

## 2023-10-30 DIAGNOSIS — I10 Essential (primary) hypertension: Secondary | ICD-10-CM | POA: Diagnosis not present

## 2023-10-30 DIAGNOSIS — M792 Neuralgia and neuritis, unspecified: Secondary | ICD-10-CM | POA: Diagnosis not present

## 2023-10-30 DIAGNOSIS — Z794 Long term (current) use of insulin: Secondary | ICD-10-CM | POA: Diagnosis not present

## 2023-10-30 DIAGNOSIS — F32A Depression, unspecified: Secondary | ICD-10-CM | POA: Diagnosis not present

## 2023-10-30 DIAGNOSIS — Z79899 Other long term (current) drug therapy: Secondary | ICD-10-CM | POA: Diagnosis not present

## 2023-10-30 DIAGNOSIS — N1831 Chronic kidney disease, stage 3a: Secondary | ICD-10-CM | POA: Diagnosis not present

## 2023-10-30 DIAGNOSIS — E1122 Type 2 diabetes mellitus with diabetic chronic kidney disease: Secondary | ICD-10-CM | POA: Diagnosis not present

## 2023-11-07 ENCOUNTER — Other Ambulatory Visit: Payer: Self-pay | Admitting: Internal Medicine

## 2023-11-07 ENCOUNTER — Ambulatory Visit: Payer: Self-pay | Admitting: Internal Medicine

## 2023-11-07 ENCOUNTER — Encounter: Payer: Self-pay | Admitting: Internal Medicine

## 2023-11-07 VITALS — BP 125/79 | HR 100 | Ht 68.0 in | Wt 171.0 lb

## 2023-11-07 DIAGNOSIS — E118 Type 2 diabetes mellitus with unspecified complications: Secondary | ICD-10-CM | POA: Diagnosis not present

## 2023-11-07 DIAGNOSIS — E782 Mixed hyperlipidemia: Secondary | ICD-10-CM | POA: Diagnosis not present

## 2023-11-07 DIAGNOSIS — K219 Gastro-esophageal reflux disease without esophagitis: Secondary | ICD-10-CM | POA: Diagnosis not present

## 2023-11-07 DIAGNOSIS — G894 Chronic pain syndrome: Secondary | ICD-10-CM

## 2023-11-07 DIAGNOSIS — N1832 Chronic kidney disease, stage 3b: Secondary | ICD-10-CM

## 2023-11-07 DIAGNOSIS — F32A Depression, unspecified: Secondary | ICD-10-CM | POA: Diagnosis not present

## 2023-11-07 DIAGNOSIS — F419 Anxiety disorder, unspecified: Secondary | ICD-10-CM | POA: Diagnosis not present

## 2023-11-07 DIAGNOSIS — I10 Essential (primary) hypertension: Secondary | ICD-10-CM | POA: Diagnosis not present

## 2023-11-07 DIAGNOSIS — Z23 Encounter for immunization: Secondary | ICD-10-CM | POA: Diagnosis not present

## 2023-11-07 DIAGNOSIS — E559 Vitamin D deficiency, unspecified: Secondary | ICD-10-CM

## 2023-11-07 DIAGNOSIS — Z7984 Long term (current) use of oral hypoglycemic drugs: Secondary | ICD-10-CM

## 2023-11-07 NOTE — Patient Instructions (Signed)
 It was a pleasure to see you today.  Thank you for giving [place] the opportunity to be involved in your care.  Below is a brief recap of your visit and next steps.  We will plan to see you again in 3 months.  Summary No medication changes today Repeat labs ordered Follow up in 3 months

## 2023-11-07 NOTE — Progress Notes (Signed)
 Established Patient Office Visit  Subjective   Patient ID: [name], female    DOB: 1945/12/15  Age: 78 y.o. MRN: [id number]  Chief Complaint  Patient presents with   Care Management    Follow up    Ms. Mcneish returns to care today for routine follow-up.  She was last evaluated by me in August 2024.  The frequency of glipizide was reduced to once daily, simvastatin was discontinued in favor of atorvastatin, and 13-month follow-up was arranged.  In the interim, she has been evaluated by rheumatology, cardiology, and pain management.  Today she reports feeling well and has no acute concerns to discuss.  Past Medical History:  Diagnosis Date   Arthritis    Coronary artery disease    Diabetes mellitus without complication (HCC)    Encephalopathy 03/02/2014   Hyperlipidemia    Hypertension    Osteoporosis    Referred otalgia of right ear 02/11/2017   Renal disorder    Sarcoidosis    Throat pain 03/03/2020   Past Surgical History:  Procedure Laterality Date   ANKLE SURGERY     BACK SURGERY     FRACTURE SURGERY     tibia    Social History   Tobacco Use   Smoking status: Never    Passive exposure: Past   Smokeless tobacco: Never  Vaping Use   Vaping status: Never Used  Substance Use Topics   Alcohol use: No   Drug use: No   Family History  Problem Relation Age of Onset   Hypertension Mother    Hyperlipidemia Mother    Throat cancer Father    Early death Son    Allergies  Allergen Reactions   Darvon [Propoxyphene] Nausea And Vomiting   Morphine And Codeine Itching and Rash   Penicillins Itching and Rash    Pt denies this allergy   Review of Systems  Constitutional:  Negative for chills and fever.  HENT:  Negative for sore throat.   Respiratory:  Negative for cough and shortness of breath.   Cardiovascular:  Negative for chest pain, palpitations and leg swelling.  Gastrointestinal:  Negative for abdominal pain, blood in stool, constipation, diarrhea, nausea and  vomiting.  Genitourinary:  Negative for dysuria and hematuria.  Musculoskeletal:  Negative for myalgias.  Skin:  Negative for itching and rash.  Neurological:  Negative for dizziness and headaches.  Psychiatric/Behavioral:  Negative for depression and suicidal ideas.      Objective:     BP 125/79 (BP Location: Right Arm, Patient Position: Sitting, Cuff Size: Normal)   Pulse 100   Ht 5\' 8"  (1.727 m)   Wt 171 lb (77.6 kg)   SpO2 98%   BMI 26.00 kg/m  BP Readings from Last 3 Encounters:  11/07/23 125/79  07/08/23 116/77  05/29/23 118/80   Physical Exam Vitals reviewed.  Constitutional:      General: She is not in acute distress.    Appearance: Normal appearance. She is not toxic-appearing.  HENT:     Head: Normocephalic and atraumatic.     Right Ear: External ear normal.     Left Ear: External ear normal.     Nose: Nose normal. No congestion or rhinorrhea.     Mouth/Throat:     Mouth: Mucous membranes are moist.     Pharynx: Oropharynx is clear. No oropharyngeal exudate or posterior oropharyngeal erythema.  Eyes:     General: No scleral icterus.    Extraocular Movements: Extraocular movements intact.  Conjunctiva/sclera: Conjunctivae normal.     Pupils: Pupils are equal, round, and reactive to light.  Cardiovascular:     Rate and Rhythm: Normal rate and regular rhythm.     Pulses: Normal pulses.     Heart sounds: Normal heart sounds. No murmur heard.    No friction rub. No gallop.  Pulmonary:     Effort: Pulmonary effort is normal.     Breath sounds: Normal breath sounds. No wheezing, rhonchi or rales.  Abdominal:     General: Abdomen is flat. Bowel sounds are normal. There is no distension.     Palpations: Abdomen is soft.     Tenderness: There is no abdominal tenderness.  Musculoskeletal:        General: No swelling. Normal range of motion.     Cervical back: Normal range of motion.     Right lower leg: No edema.     Left lower leg: No edema.   Lymphadenopathy:     Cervical: No cervical adenopathy.  Skin:    General: Skin is warm and dry.     Capillary Refill: Capillary refill takes less than 2 seconds.     Coloration: Skin is not jaundiced.  Neurological:     General: No focal deficit present.     Mental Status: She is alert and oriented to person, place, and time.  Psychiatric:        Mood and Affect: Mood normal.        Behavior: Behavior normal.   Last CBC Lab Results  Component Value Date   WBC 8.5 11/07/2023   HGB 14.1 11/07/2023   HCT 44.6 11/07/2023   MCV 87 11/07/2023   MCH 27.4 11/07/2023   RDW 15.6 (H) 11/07/2023   PLT 242 11/07/2023   Last metabolic panel Lab Results  Component Value Date   GLUCOSE 174 (H) 11/07/2023   NA 140 11/07/2023   K 4.5 11/07/2023   CL 103 11/07/2023   CO2 24 11/07/2023   BUN 27 11/07/2023   CREATININE 1.35 (H) 11/07/2023   EGFR 41 (L) 11/07/2023   CALCIUM 10.1 11/07/2023   PHOS 2.8 02/28/2011   PROT 7.0 11/07/2023   ALBUMIN 4.4 11/07/2023   LABGLOB 2.6 11/07/2023   AGRATIO 1.7 12/26/2022   BILITOT 0.3 11/07/2023   ALKPHOS 56 11/07/2023   AST 18 11/07/2023   ALT 17 11/07/2023   ANIONGAP 9 05/06/2021   Last lipids Lab Results  Component Value Date   CHOL 180 12/26/2022   HDL 62 12/26/2022   LDLCALC 95 12/26/2022   TRIG 131 12/26/2022   CHOLHDL 2.9 12/26/2022   Last hemoglobin A1c Lab Results  Component Value Date   HGBA1C 6.5 (H) 11/07/2023   Last thyroid functions Lab Results  Component Value Date   TSH 2.890 11/07/2023   Last vitamin D Lab Results  Component Value Date   VD25OH 26.5 (L) 11/07/2023   Last vitamin B12 and Folate Lab Results  Component Value Date   VITAMINB12 404 11/07/2023   FOLATE 7.9 11/07/2023   The 10-year ASCVD risk score (Arnett DK, et al., 2019) is: 22.8%    Assessment & Plan:   Problem List Items Addressed This Visit       Essential hypertension   Remains adequately controlled on current antihypertensive  regimen.  No medication changes are indicated today.      Gastroesophageal reflux disease   Symptoms remain well-controlled with esomeprazole.      Controlled diabetes mellitus type 2 with complications (  HCC)   A1c 6.6 on labs from May 2024.  Glipizide was reduced to 5 mg daily at her last appointment.  She is additionally prescribed Jardiance 25 mg daily.  She reports today that she is no longer taking glipizide.  Home blood pressure readings remain well within goal.  Repeat A1c ordered today      CKD (chronic kidney disease) stage 3, GFR 30-59 ml/min (HCC)   Renal function stable on labs from May 2024.  Currently prescribed Jardiance.  Repeat labs ordered today.      Hyperlipidemia   Lipid panel updated in May 2024.  Total cholesterol 180 and LDL 95.  Simvastatin was discontinued in favor of atorvastatin 40 mg daily in light of this result.  She is additionally prescribed ezetimibe 10 mg daily.  Repeat lipid panel ordered today.      Anxiety and depression   Mood remains stable and anxiety adequately controlled with sertraline 100 mg daily.      Need for influenza vaccination   Influenza vaccine administered today      Return in about 3 months (around 02/07/2024).   Billie Lade, MD

## 2023-11-08 ENCOUNTER — Other Ambulatory Visit: Payer: Self-pay | Admitting: Internal Medicine

## 2023-11-08 ENCOUNTER — Encounter: Payer: Self-pay | Admitting: Internal Medicine

## 2023-11-08 DIAGNOSIS — F419 Anxiety disorder, unspecified: Secondary | ICD-10-CM

## 2023-11-08 LAB — CMP14+EGFR
ALT: 17 IU/L (ref 0–32)
AST: 18 IU/L (ref 0–40)
Albumin: 4.4 g/dL (ref 3.8–4.8)
Alkaline Phosphatase: 56 IU/L (ref 44–121)
BUN/Creatinine Ratio: 20 (ref 12–28)
BUN: 27 mg/dL (ref 8–27)
Bilirubin Total: 0.3 mg/dL (ref 0.0–1.2)
CO2: 24 mmol/L (ref 20–29)
Calcium: 10.1 mg/dL (ref 8.7–10.3)
Chloride: 103 mmol/L (ref 96–106)
Creatinine, Ser: 1.35 mg/dL — ABNORMAL HIGH (ref 0.57–1.00)
Globulin, Total: 2.6 g/dL (ref 1.5–4.5)
Glucose: 174 mg/dL — ABNORMAL HIGH (ref 70–99)
Potassium: 4.5 mmol/L (ref 3.5–5.2)
Sodium: 140 mmol/L (ref 134–144)
Total Protein: 7 g/dL (ref 6.0–8.5)
eGFR: 41 mL/min/{1.73_m2} — ABNORMAL LOW (ref >59)

## 2023-11-08 LAB — CBC WITH DIFFERENTIAL/PLATELET
Basophils Absolute: 0.1 10*3/uL (ref 0.0–0.2)
Basos: 1 %
EOS (ABSOLUTE): 0.1 10*3/uL (ref 0.0–0.4)
Eos: 1 %
Hematocrit: 44.6 % (ref 34.0–46.6)
Hemoglobin: 14.1 g/dL (ref 11.1–15.9)
Immature Grans (Abs): 0.1 10*3/uL (ref 0.0–0.1)
Immature Granulocytes: 1 %
Lymphocytes Absolute: 2.1 10*3/uL (ref 0.7–3.1)
Lymphs: 25 %
MCH: 27.4 pg (ref 26.6–33.0)
MCHC: 31.6 g/dL (ref 31.5–35.7)
MCV: 87 fL (ref 79–97)
Monocytes Absolute: 0.5 10*3/uL (ref 0.1–0.9)
Monocytes: 5 %
Neutrophils Absolute: 5.8 10*3/uL (ref 1.4–7.0)
Neutrophils: 67 %
Platelets: 242 10*3/uL (ref 150–450)
RBC: 5.15 x10E6/uL (ref 3.77–5.28)
RDW: 15.6 % — ABNORMAL HIGH (ref 11.7–15.4)
WBC: 8.5 10*3/uL (ref 3.4–10.8)

## 2023-11-08 LAB — TSH+FREE T4
Free T4: 1.06 ng/dL (ref 0.82–1.77)
TSH: 2.89 u[IU]/mL (ref 0.450–4.500)

## 2023-11-08 LAB — B12 AND FOLATE PANEL
Folate: 7.9 ng/mL (ref >3.0)
Vitamin B-12: 404 pg/mL (ref 232–1245)

## 2023-11-08 LAB — VITAMIN D 25 HYDROXY (VIT D DEFICIENCY, FRACTURES): Vit D, 25-Hydroxy: 26.5 ng/mL — ABNORMAL LOW (ref 30.0–100.0)

## 2023-11-08 LAB — HEMOGLOBIN A1C
Est. average glucose Bld gHb Est-mCnc: 140 mg/dL
Hgb A1c MFr Bld: 6.5 % — ABNORMAL HIGH (ref 4.8–5.6)

## 2023-11-22 DIAGNOSIS — G8929 Other chronic pain: Secondary | ICD-10-CM | POA: Diagnosis not present

## 2023-11-22 DIAGNOSIS — M5136 Other intervertebral disc degeneration, lumbar region with discogenic back pain only: Secondary | ICD-10-CM | POA: Diagnosis not present

## 2023-11-22 DIAGNOSIS — Z981 Arthrodesis status: Secondary | ICD-10-CM | POA: Diagnosis not present

## 2023-11-22 DIAGNOSIS — M4804 Spinal stenosis, thoracic region: Secondary | ICD-10-CM | POA: Diagnosis not present

## 2023-11-22 DIAGNOSIS — M4314 Spondylolisthesis, thoracic region: Secondary | ICD-10-CM | POA: Diagnosis not present

## 2023-11-22 DIAGNOSIS — M48061 Spinal stenosis, lumbar region without neurogenic claudication: Secondary | ICD-10-CM | POA: Diagnosis not present

## 2023-11-22 DIAGNOSIS — M545 Low back pain, unspecified: Secondary | ICD-10-CM | POA: Diagnosis not present

## 2023-11-22 DIAGNOSIS — G894 Chronic pain syndrome: Secondary | ICD-10-CM | POA: Diagnosis not present

## 2023-11-22 DIAGNOSIS — M961 Postlaminectomy syndrome, not elsewhere classified: Secondary | ICD-10-CM | POA: Diagnosis not present

## 2023-11-22 DIAGNOSIS — M47816 Spondylosis without myelopathy or radiculopathy, lumbar region: Secondary | ICD-10-CM | POA: Diagnosis not present

## 2023-11-22 DIAGNOSIS — M47814 Spondylosis without myelopathy or radiculopathy, thoracic region: Secondary | ICD-10-CM | POA: Diagnosis not present

## 2023-11-27 ENCOUNTER — Other Ambulatory Visit: Payer: Self-pay | Admitting: Internal Medicine

## 2023-11-27 DIAGNOSIS — I10 Essential (primary) hypertension: Secondary | ICD-10-CM

## 2023-11-28 MED ORDER — FUROSEMIDE 40 MG PO TABS
40.0000 mg | ORAL_TABLET | Freq: Every day | ORAL | 0 refills | Status: DC
Start: 2023-11-28 — End: 2024-03-02

## 2023-11-29 DIAGNOSIS — I10 Essential (primary) hypertension: Secondary | ICD-10-CM | POA: Diagnosis not present

## 2023-11-29 DIAGNOSIS — Z79899 Other long term (current) drug therapy: Secondary | ICD-10-CM | POA: Diagnosis not present

## 2023-11-29 DIAGNOSIS — D869 Sarcoidosis, unspecified: Secondary | ICD-10-CM | POA: Diagnosis not present

## 2023-11-29 DIAGNOSIS — M961 Postlaminectomy syndrome, not elsewhere classified: Secondary | ICD-10-CM | POA: Diagnosis not present

## 2023-11-29 DIAGNOSIS — G894 Chronic pain syndrome: Secondary | ICD-10-CM | POA: Diagnosis not present

## 2023-11-29 DIAGNOSIS — Z981 Arthrodesis status: Secondary | ICD-10-CM | POA: Diagnosis not present

## 2023-11-29 DIAGNOSIS — E119 Type 2 diabetes mellitus without complications: Secondary | ICD-10-CM | POA: Diagnosis not present

## 2023-11-29 DIAGNOSIS — E785 Hyperlipidemia, unspecified: Secondary | ICD-10-CM | POA: Diagnosis not present

## 2023-12-04 DIAGNOSIS — Z23 Encounter for immunization: Secondary | ICD-10-CM | POA: Insufficient documentation

## 2023-12-04 NOTE — Assessment & Plan Note (Signed)
 Influenza vaccine administered today.

## 2023-12-04 NOTE — Assessment & Plan Note (Signed)
 Lipid panel updated in May 2024.  Total cholesterol 180 and LDL 95.  Simvastatin was discontinued in favor of atorvastatin 40 mg daily in light of this result.  She is additionally prescribed ezetimibe 10 mg daily.  Repeat lipid panel ordered today.

## 2023-12-04 NOTE — Assessment & Plan Note (Signed)
 Renal function stable on labs from May 2024.  Currently prescribed Jardiance.  Repeat labs ordered today.

## 2023-12-04 NOTE — Assessment & Plan Note (Signed)
 Remains adequately controlled on current antihypertensive regimen.  No medication changes are indicated today.

## 2023-12-04 NOTE — Assessment & Plan Note (Signed)
 Mood remains stable and anxiety adequately controlled with sertraline 100 mg daily.

## 2023-12-04 NOTE — Assessment & Plan Note (Signed)
 Symptoms remain well-controlled with esomeprazole.

## 2023-12-04 NOTE — Assessment & Plan Note (Signed)
 A1c 6.6 on labs from May 2024.  Glipizide was reduced to 5 mg daily at her last appointment.  She is additionally prescribed Jardiance 25 mg daily.  She reports today that she is no longer taking glipizide.  Home blood pressure readings remain well within goal.  Repeat A1c ordered today

## 2023-12-06 DIAGNOSIS — M47816 Spondylosis without myelopathy or radiculopathy, lumbar region: Secondary | ICD-10-CM | POA: Diagnosis not present

## 2023-12-08 ENCOUNTER — Other Ambulatory Visit: Payer: Self-pay | Admitting: Internal Medicine

## 2023-12-08 DIAGNOSIS — F419 Anxiety disorder, unspecified: Secondary | ICD-10-CM

## 2023-12-09 ENCOUNTER — Other Ambulatory Visit: Payer: Self-pay | Admitting: Internal Medicine

## 2023-12-09 DIAGNOSIS — F419 Anxiety disorder, unspecified: Secondary | ICD-10-CM

## 2023-12-09 MED ORDER — BUSPIRONE HCL 7.5 MG PO TABS: 7.5000 mg | ORAL_TABLET | Freq: Two times a day (BID) | ORAL | 0 refills | Status: DC

## 2023-12-13 ENCOUNTER — Other Ambulatory Visit: Payer: Self-pay | Admitting: Internal Medicine

## 2023-12-13 DIAGNOSIS — E118 Type 2 diabetes mellitus with unspecified complications: Secondary | ICD-10-CM

## 2023-12-22 DIAGNOSIS — R109 Unspecified abdominal pain: Secondary | ICD-10-CM | POA: Diagnosis not present

## 2023-12-22 DIAGNOSIS — M461 Sacroiliitis, not elsewhere classified: Secondary | ICD-10-CM | POA: Diagnosis not present

## 2023-12-27 ENCOUNTER — Encounter (HOSPITAL_COMMUNITY): Payer: Self-pay | Admitting: Emergency Medicine

## 2023-12-27 ENCOUNTER — Emergency Department (HOSPITAL_COMMUNITY)
Admission: EM | Admit: 2023-12-27 | Discharge: 2023-12-27 | Disposition: A | Discharge status: Home / Self Care | Attending: Emergency Medicine | Admitting: Emergency Medicine

## 2023-12-27 ENCOUNTER — Other Ambulatory Visit: Payer: Self-pay

## 2023-12-27 ENCOUNTER — Emergency Department (HOSPITAL_COMMUNITY)

## 2023-12-27 DIAGNOSIS — N2889 Other specified disorders of kidney and ureter: Secondary | ICD-10-CM | POA: Diagnosis not present

## 2023-12-27 DIAGNOSIS — Z7984 Long term (current) use of oral hypoglycemic drugs: Secondary | ICD-10-CM | POA: Insufficient documentation

## 2023-12-27 DIAGNOSIS — E1122 Type 2 diabetes mellitus with diabetic chronic kidney disease: Secondary | ICD-10-CM | POA: Diagnosis not present

## 2023-12-27 DIAGNOSIS — E876 Hypokalemia: Secondary | ICD-10-CM | POA: Diagnosis not present

## 2023-12-27 DIAGNOSIS — R109 Unspecified abdominal pain: Secondary | ICD-10-CM | POA: Diagnosis not present

## 2023-12-27 DIAGNOSIS — N189 Chronic kidney disease, unspecified: Secondary | ICD-10-CM | POA: Diagnosis not present

## 2023-12-27 DIAGNOSIS — N281 Cyst of kidney, acquired: Secondary | ICD-10-CM | POA: Diagnosis not present

## 2023-12-27 DIAGNOSIS — K439 Ventral hernia without obstruction or gangrene: Secondary | ICD-10-CM | POA: Diagnosis not present

## 2023-12-27 DIAGNOSIS — Z79899 Other long term (current) drug therapy: Secondary | ICD-10-CM | POA: Diagnosis not present

## 2023-12-27 DIAGNOSIS — R1031 Right lower quadrant pain: Secondary | ICD-10-CM | POA: Diagnosis not present

## 2023-12-27 DIAGNOSIS — I129 Hypertensive chronic kidney disease with stage 1 through stage 4 chronic kidney disease, or unspecified chronic kidney disease: Secondary | ICD-10-CM | POA: Diagnosis not present

## 2023-12-27 LAB — BASIC METABOLIC PANEL WITH GFR
Anion gap: 9 (ref 5–15)
BUN: 20 mg/dL (ref 8–23)
CO2: 25 mmol/L (ref 22–32)
Calcium: 9.2 mg/dL (ref 8.9–10.3)
Chloride: 106 mmol/L (ref 98–111)
Creatinine, Ser: 1.16 mg/dL — ABNORMAL HIGH (ref 0.44–1.00)
GFR, Estimated: 49 mL/min — ABNORMAL LOW (ref >60)
Glucose, Bld: 146 mg/dL — ABNORMAL HIGH (ref 70–99)
Potassium: 3.4 mmol/L — ABNORMAL LOW (ref 3.5–5.1)
Sodium: 140 mmol/L (ref 135–145)

## 2023-12-27 LAB — URINALYSIS, ROUTINE W REFLEX MICROSCOPIC
Bacteria, UA: NONE SEEN
Bilirubin Urine: NEGATIVE
Glucose, UA: >=500 mg/dL — AB
Hgb urine dipstick: NEGATIVE
Ketones, ur: NEGATIVE mg/dL
Leukocytes,Ua: NEGATIVE
Nitrite: NEGATIVE
Protein, ur: 30 mg/dL — AB
Specific Gravity, Urine: 1.026 (ref 1.005–1.030)
pH: 5 (ref 5.0–8.0)

## 2023-12-27 LAB — CBC
HCT: 41.7 % (ref 36.0–46.0)
Hemoglobin: 13.1 g/dL (ref 12.0–15.0)
MCH: 28.1 pg (ref 26.0–34.0)
MCHC: 31.4 g/dL (ref 30.0–36.0)
MCV: 89.3 fL (ref 80.0–100.0)
Platelets: 147 10*3/uL — ABNORMAL LOW (ref 150–400)
RBC: 4.67 MIL/uL (ref 3.87–5.11)
RDW: 17.2 % — ABNORMAL HIGH (ref 11.5–15.5)
WBC: 4.9 10*3/uL (ref 4.0–10.5)
nRBC: 0 % (ref 0.0–0.2)

## 2023-12-27 LAB — HEPATIC FUNCTION PANEL
ALT: 14 U/L (ref 0–44)
AST: 18 U/L (ref 15–41)
Albumin: 3.3 g/dL — ABNORMAL LOW (ref 3.5–5.0)
Alkaline Phosphatase: 33 U/L — ABNORMAL LOW (ref 38–126)
Bilirubin, Direct: <0.1 mg/dL (ref 0.0–0.2)
Total Bilirubin: 0.6 mg/dL (ref 0.0–1.2)
Total Protein: 6.2 g/dL — ABNORMAL LOW (ref 6.5–8.1)

## 2023-12-27 MED ORDER — CYCLOBENZAPRINE HCL 10 MG PO TABS
10.0000 mg | ORAL_TABLET | Freq: Once | ORAL | Status: AC
  Administered 2023-12-27: 10 mg via ORAL
  Filled 2023-12-27: qty 1

## 2023-12-27 MED ORDER — HYDROMORPHONE HCL 1 MG/ML IJ SOLN
0.5000 mg | Freq: Once | INTRAMUSCULAR | Status: AC
  Administered 2023-12-27: 0.5 mg via INTRAMUSCULAR
  Filled 2023-12-27: qty 0.5

## 2023-12-27 MED ORDER — IOHEXOL 300 MG/ML  SOLN
100.0000 mL | Freq: Once | INTRAMUSCULAR | Status: AC | PRN
  Administered 2023-12-27: 100 mL via INTRAVENOUS

## 2023-12-27 MED ORDER — CYCLOBENZAPRINE HCL 10 MG PO TABS: 10.0000 mg | ORAL_TABLET | Freq: Three times a day (TID) | ORAL | 0 refills | Status: AC | PRN

## 2023-12-27 NOTE — Progress Notes (Deleted)
 Office Visit Note  Patient: [name]             Date of Birth: [birth year]           MRN: [id number]             PCP: Tobi Fortes, MD Referring: Tobi Fortes, MD Visit Date: 01/08/2024   Subjective:  No chief complaint on file.   History of Present Illness: [name] is a 78 y.o. female here for follow up for joint pains with history of sarcoidosis.    Previous HPI 07/08/2023 [name] is a 78 y.o. female here for follow up for joint pains with history of sarcoidosis.  Lab testing in our initial visit was unremarkable with no evidence of systemic inflammation, low ACE level, and normal vitamin D .  She has not noticed any new or progressive symptoms despite more time off hydroxychloroquine .  She is experiencing easy or spontaneous bruising notices this on her forearms and on her thighs.  Also noticing more frequent numbness in her hands often in the morning or intermittent during day time. But not specifically change in grip strength or dropping anything unintentionally.   Previous HPI 05/29/23 [name] is a 78 y.o. female here for longstanding history of sarcoidosis and long term use of hydroxychloroquine .  She was apparently diagnosed due to pulmonary involvement many years ago.  Review of available images in the medical record have at times indicated increased hilar adenopathy but without chronic calcified granulomas or other active disease.  Does not have a specific history of skin rashes, joint swelling, or uveitis that she can recall.  She had started hydroxychloroquine  treatment continuously since 21-Jan-2014.  She already stopped this medicine after medical recommendations since sometime in April or 01/22/24 of this year. Her main joint complaint today is of chronic pain in her low back.  She also has bilateral ankle pain with previous fractures and hardware failure due to extremely poor bone density.  She was treated with Fosamax  for osteoporosis but has been off this  for some time.  She sees [facility] for chronic pain management.  She takes BuSpar , Cymbalta, Lyrica , sertraline , oxycodone , and Robaxin .  Overall has been doing much worse with depressive symptoms since 2021/01/21 after the death of her son.  She frequently feels sad and sometimes with decrease in activity and worse pain.   No Rheumatology ROS completed.   PMFS History:  Patient Active Problem List   Diagnosis Date Noted   Need for influenza vaccination 12/04/2023   Bilateral carpal tunnel syndrome 07/08/2023   Easy bruising 07/08/2023   Colon cancer screening 12/26/2022   Tinnitus 10/26/2022   Viral illness 09/05/2022   Encounter for general adult medical examination with abnormal findings 07/04/2022   CKD (chronic kidney disease) stage 3, GFR 30-59 ml/min (HCC) 12/12/2021   Vitamin D  deficiency 12/12/2021   Grief 09/05/2021   Insomnia 08/12/2021   Need for immunization against influenza 08/11/2021   Overactive bladder 10/11/2020   Anxiety and depression 09/13/2020   Gastroesophageal reflux disease 03/03/2020   Laryngopharyngeal reflux (LPR) 03/03/2020   Depression, major, single episode, moderate (HCC) 12/02/2019   Controlled diabetes mellitus type 2 with complications (HCC) 12/02/2019   Hyperlipidemia 12/02/2019   Essential hypertension 12/02/2019   Lumbar radiculopathy 10/31/2019   Polyarthropathy 10/31/2019   Polyneuropathy due to type 2 diabetes mellitus (HCC) 10/31/2019   Chronic pain syndrome 10/31/2019   Bilateral sensorineural hearing loss 02/11/2017   Right  ear impacted cerumen 02/11/2017   Sarcoidosis 03/02/2014   Low back pain 03/02/2014    Past Medical History:  Diagnosis Date   Arthritis    Coronary artery disease    Diabetes mellitus without complication (HCC)    Encephalopathy 03/02/2014   Hyperlipidemia    Hypertension    Osteoporosis    Referred otalgia of right ear 02/11/2017   Renal disorder    Sarcoidosis    Throat pain 03/03/2020    Family History   Problem Relation Age of Onset   Hypertension Mother    Hyperlipidemia Mother    Throat cancer Father    Early death Son    Past Surgical History:  Procedure Laterality Date   ANKLE SURGERY     BACK SURGERY     FRACTURE SURGERY     tibia    Social History   Social History Narrative   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)   Lives with husband-Ronnie    Has 3 grown children: two live in town and one out of town   10 grandbabies   8 great grandbabies   Enjoys: reading, talk with friends, church-sing in choir       Diet: eats all food groups   Caffeine: none     Water: 4 cups daily      Wears seat belt   Does not use phone while driving   Smoke detectors at home   Immunization History  Administered Date(s) Administered   Fluad Quad(high Dose 65+) 07/27/2020, 08/11/2021, 07/04/2022   Fluad Trivalent(High Dose 65+) 11/07/2023   Influenza-Unspecified 08/02/2016, 06/26/2017, 07/03/2018   Moderna Sars-Covid-2 Vaccination 10/27/2019, 11/24/2019   Pneumococcal Conjugate-13 12/29/2013   Pneumococcal Polysaccharide-23 08/02/2016   Zoster Recombinant(Shingrix) 07/03/2018, 08/23/2022   Zoster, Live 06/15/2011     Objective: Vital Signs: There were no vitals taken for this visit.   Physical Exam   Musculoskeletal Exam: ***  CDAI Exam: CDAI Score: -- Patient Global: --; Provider Global: -- Swollen: --; Tender: -- Joint Exam 01/08/2024   No joint exam has been documented for this visit   There is currently no information documented on the homunculus. Go to the Rheumatology activity and complete the homunculus joint exam.  Investigation: No additional findings.  Imaging: No results found.  Recent Labs: Lab Results  Component Value Date   WBC 4.9 12/27/2023   HGB 13.1 12/27/2023   PLT 147 (L) 12/27/2023   NA 140 12/27/2023   K 3.4 (L) 12/27/2023   CL 106 12/27/2023   CO2 25 12/27/2023   GLUCOSE 146 (H) 12/27/2023   BUN 20 12/27/2023   CREATININE 1.16 (H)  12/27/2023   BILITOT 0.6 12/27/2023   ALKPHOS 33 (L) 12/27/2023   AST 18 12/27/2023   ALT 14 12/27/2023   PROT 6.2 (L) 12/27/2023   ALBUMIN 3.3 (L) 12/27/2023   CALCIUM  9.2 12/27/2023   GFRAA 43 (L) 02/25/2020    Speciality Comments: No specialty comments available.  Procedures:  No procedures performed Allergies: Darvon [propoxyphene], Morphine and codeine, and Penicillins   Assessment / Plan:     Visit Diagnoses: No diagnosis found.  ***  Orders: No orders of the defined types were placed in this encounter.  No orders of the defined types were placed in this encounter.    Follow-Up Instructions: No follow-ups on file.   Glena Landau, RT  Note - This record has been created using [employer].  Chart creation errors have been sought, but may not always  have  been located. Such creation errors do not reflect on  the standard of medical care.

## 2023-12-27 NOTE — ED Triage Notes (Signed)
 Pt c/o of right flank pain for 2 weeks. Worsening with movement and standing. Denies any fever, falls or urinary s/s

## 2023-12-27 NOTE — Discharge Instructions (Addendum)
 As discussed, your current symptoms are likely related to your ongoing back pain.  You may try over-the-counter 4% lidocaine  patches applied directly to the affected area.  Use as directed.  You have been prescribed a different muscle relaxer, the medication can cause drowsiness so avoid driving or operating machinery while taking the medication.  You may continue your pain medication in the lidocaine  patches.  Also, your CT today showed that you have some small gallstones.  This is not felt to be the cause of your current symptoms.  I have listed a general surgeon for you if you develop upper abdominal pain, fever, vomiting or abdominal pain with food intake.  Return to the emergency department if you develop any new or worsening symptoms.

## 2023-12-27 NOTE — ED Provider Notes (Signed)
 [facility] AT [facility] Provider Note   CSN: [id number] Arrival date & time: 12/27/23  1024     History  Chief Complaint  Patient presents with   Flank Pain    [name] is a 78 y.o. female.   Flank Pain Associated symptoms include abdominal pain. Pertinent negatives include no chest pain, no headaches and no shortness of breath.       [name] is a 78 y.o. female who presents to the Emergency Department with past medical history of sarcoidosis, hypertension, type 2 diabetes, chronic low back pain with lumbar radiculopathy, CKD complaining of right lower quadrant right flank pain.  Symptoms present for 2 weeks.  States pain initially right lower quadrant has now moved to her right flank area.  Pain is worsened with movement and standing or walking.  She denies any recent injury or fall, fever, nausea vomiting, dysuria symptoms, or pain worsening with food intake.  She states she was recently seen at urgent care and diagnosed with constipation.  She has been taking Dulcolax and MiraLAX without improvement of her symptoms.  Home Medications Prior to Admission medications   Medication Sig Start Date End Date Taking? Authorizing Provider  albuterol  (VENTOLIN  HFA) 108 (90 Base) MCG/ACT inhaler Inhale 1-2 puffs into the lungs every 6 (six) hours as needed for wheezing or shortness of breath. Patient not taking: Reported on 05/29/2023 10/22/20   Avegno, Komlanvi S, FNP  atorvastatin  (LIPITOR) 40 MG tablet Take 1 tablet (40 mg total) by mouth daily. 04/17/23   Tobi Fortes, MD  busPIRone  (BUSPAR ) 7.5 MG tablet Take 1 tablet (7.5 mg total) by mouth 2 (two) times daily. 12/09/23   Tobi Fortes, MD  doxepin  (SINEQUAN ) 10 MG/ML solution Take 1 mL (10 mg total) by mouth 2 (two) times daily as needed for sleep (oral discomfort). Dilute 20 mg (2 mL) in 120 mL of water, milk, orange juice, grapefruit juice, tomato juice, prune juice, or pineapple juice  and take twice daily as needed for oral discomfort. 04/17/23   Tobi Fortes, MD  DULoxetine (CYMBALTA) 30 MG capsule Take 30 mg by mouth 2 (two) times daily. 04/19/21   [provider]  esomeprazole  (NEXIUM ) 20 MG capsule Take 1 capsule (20 mg total) by mouth daily at 2 PM. 10/25/23 04/22/24  Tobi Fortes, MD  ezetimibe  (ZETIA ) 10 MG tablet Take 1 tablet (10 mg total) by mouth daily. 10/14/23   Tobi Fortes, MD  furosemide  (LASIX ) 40 MG tablet Take 1 tablet (40 mg total) by mouth daily. 11/28/23   Tobi Fortes, MD  glipiZIDE  (GLUCOTROL  XL) 5 MG 24 hr tablet Take 1 tablet (5 mg total) by mouth daily with breakfast. 09/16/23   Tobi Fortes, MD  JARDIANCE  25 MG TABS tablet Take 1 tablet (10 mg total) by mouth daily before breakfast. 12/13/23   Tobi Fortes, MD  lisinopril  (ZESTRIL ) 2.5 MG tablet TAKE ONE TABLET BY MOUTH ONCE DAILY FOR BLOOD PRESSURE. 10/18/23   Dixon, Phillip E, MD  magic mouthwash (lidocaine , diphenhydrAMINE, alum & mag hydroxide) suspension Swish and spit 5 mLs 3 (three) times daily as needed for mouth pain. 04/17/23   Tobi Fortes, MD  methocarbamol  (ROBAXIN ) 500 MG tablet TAKE ONE TABLET BY MOUTH FOUR TIMES DAILY AS NEEDED 10/17/23   Dixon, Phillip E, MD  oxybutynin  (DITROPAN ) 5 MG tablet TAKE (1) TABLET BY MOUTH (3) TIMES DAILY. 10/03/23   Tobi Fortes, MD  oxyCODONE -acetaminophen  (PERCOCET/ROXICET) 5-325 MG tablet Take 1 tablet by mouth 3 (three) times daily as needed. 05/03/23   [provider]  potassium chloride  SA (KLOR-CON  M) 20 MEQ tablet TAKE (1) TABLET BY MOUTH TWICE DAILY. 11/12/22   Jerrlyn Morel, NP  pregabalin  (LYRICA ) 100 MG capsule TAKE 1 CAPSULE IN THE MORNING AND 2 CAPSULES AT BEDTIME. 03/11/23   Tobi Fortes, MD  sertraline  (ZOLOFT ) 100 MG tablet Take 1 tablet (100 mg total) by mouth daily. 10/07/23   Dixon, Phillip E, MD  sodium bicarbonate  650 MG tablet TAKE (1) TABLET BY MOUTH TWICE DAILY. 04/11/23   Tobi Fortes, MD       Allergies    Darvon [propoxyphene], Morphine and codeine, and Penicillins    Review of Systems   Review of Systems  Constitutional:  Negative for appetite change and fever.  Respiratory:  Negative for cough and shortness of breath.   Cardiovascular:  Negative for chest pain.  Gastrointestinal:  Positive for abdominal pain. Negative for diarrhea, nausea and vomiting.  Genitourinary:  Positive for flank pain. Negative for difficulty urinating and dysuria.  Musculoskeletal:  Positive for back pain.  Skin:  Negative for rash.  Neurological:  Negative for weakness, numbness and headaches.    Physical Exam Updated Vital Signs BP 128/77 (BP Location: Right Arm)   Pulse 95   Temp 98.5 F (36.9 C) (Oral)   Resp 19   Ht 5\' 8"  (1.727 m)   Wt 74.8 kg   SpO2 96%   BMI 25.09 kg/m  Physical Exam Vitals and nursing note reviewed.  Constitutional:      General: She is not in acute distress.    Appearance: Normal appearance. She is not ill-appearing or toxic-appearing.  Cardiovascular:     Rate and Rhythm: Normal rate and regular rhythm.     Pulses: Normal pulses.  Pulmonary:     Effort: Pulmonary effort is normal.  Chest:     Chest wall: No tenderness.  Abdominal:     General: There is no distension.     Palpations: Abdomen is soft.     Tenderness: There is no abdominal tenderness. There is no right CVA tenderness, left CVA tenderness or guarding.  Musculoskeletal:        General: Tenderness present. Normal range of motion.     Comments: Some tenderness to palpation right lumbar paraspinal muscles. pain with SLR on right.  Has FROM of bilateral hips  Skin:    General: Skin is warm.     Capillary Refill: Capillary refill takes less than 2 seconds.  Neurological:     General: No focal deficit present.     Mental Status: She is alert.     Sensory: No sensory deficit.     Motor: No weakness.     ED Results / Procedures / Treatments   Labs (all labs ordered are listed, but  only abnormal results are displayed) Labs Reviewed  URINALYSIS, ROUTINE W REFLEX MICROSCOPIC - Abnormal; Notable for the following components:      Result Value   Glucose, UA >=500 (*)    Protein, ur 30 (*)    All other components within normal limits  CBC - Abnormal; Notable for the following components:   RDW 17.2 (*)    Platelets 147 (*)    All other components within normal limits  BASIC METABOLIC PANEL WITH GFR - Abnormal; Notable for the following components:   Potassium 3.4 (*)    Glucose, Bld 146 (*)  Creatinine, Ser 1.16 (*)    GFR, Estimated 49 (*)    All other components within normal limits  HEPATIC FUNCTION PANEL - Abnormal; Notable for the following components:   Total Protein 6.2 (*)    Albumin 3.3 (*)    Alkaline Phosphatase 33 (*)    All other components within normal limits    EKG None  Radiology CT ABDOMEN PELVIS W CONTRAST Result Date: 12/27/2023 CLINICAL DATA:  Right flank pain for 2 weeks. EXAM: CT ABDOMEN AND PELVIS WITH CONTRAST TECHNIQUE: Multidetector CT imaging of the abdomen and pelvis was performed using the standard protocol following bolus administration of intravenous contrast. RADIATION DOSE REDUCTION: This exam was performed according to the departmental dose-optimization program which includes automated exposure control, adjustment of the mA and/or kV according to patient size and/or use of iterative reconstruction technique. CONTRAST:  OMNIPAQUE  IOHEXOL  300 MG/ML  SOLN COMPARISON:  Jan 18, 2017 FINDINGS: Lower chest: Mild atelectatic changes are seen within the bilateral lung bases. Hepatobiliary: No focal liver abnormality is seen. A layer of tiny gallstones and/or sludge is seen within the dependent portion of an otherwise normal-appearing gallbladder. No biliary dilatation. Pancreas: Unremarkable. No pancreatic ductal dilatation or surrounding inflammatory changes. Spleen: Normal in size without focal abnormality. Adrenals/Urinary Tract:  Adrenal glands are unremarkable. Kidneys are normal in size, without renal calculi or hydronephrosis. Multi stable bilateral simple renal cysts of various sizes are seen. Bladder is unremarkable. Stomach/Bowel: Stomach is within normal limits. Appendix appears normal. No evidence of bowel wall thickening, distention, or inflammatory changes. Vascular/Lymphatic: Aortic atherosclerosis. No enlarged abdominal or pelvic lymph nodes. Reproductive: Uterus and bilateral adnexa are unremarkable. Other: There is a small, stable fat containing ventral hernia along the midline of the mid abdomen. Musculoskeletal: Postoperative changes seen within the lower lumbar spine. IMPRESSION: 1. Cholelithiasis and/or gallbladder sludge. 2. Stable bilateral simple renal cysts. No follow-up imaging is recommended. This recommendation follows ACR consensus guidelines: Management of the Incidental Renal Mass on CT: A White Paper of the ACR Incidental Findings Committee. J Am Coll Radiol [phone]. 3. Small, stable fat containing ventral hernia along the midline of the mid abdomen. 4. Postoperative changes within the lower lumbar spine. 5. Aortic atherosclerosis. Electronically Signed   By: Virgle Grime M.D.   On: 12/27/2023 13:42    Procedures Procedures    Medications Ordered in ED Medications - No data to display  ED Course/ Medical Decision Making/ A&P                                 Medical Decision Making Patient here with complaint of right lower quadrant right flank pain for 2 weeks.  Evaluated initially at urgent care was recommended to take MiraLAX.  Patient taking medication without improvement of her symptoms.  Pain is exacerbated by walking standing and movement.  Endorses history of chronic back pain takes Percocet for her back pain.  Denies any numbness or weakness of her extremities.  No dysuria symptoms  Differential would include but not limited to kidney stone, pyelonephritis, musculoskeletal  pain, sciatica, cauda equina acute appendicitis, SBO  Amount and/or Complexity of Data Reviewed Labs: ordered.    Details: Labs interpreted by me, no evidence of leukocytosis, urinalysis without evidence of infection.  LFTs unremarkable, chemistry shows slightly elevated serum creatinine but improved from baseline Radiology: ordered.    Details: CT abdomen and pelvis was ordered for further evaluation, shows cholelithiasis and/or  gallbladder sludge small stable fat-containing ventral hernia and bilateral renal cysts Discussion of management or test interpretation with external provider(s): Well-appearing patient here, vital signs are reassuring.  Her right flank pain is felt to be musculoskeletal and likely secondary to her chronic back pain.  No red flags on exam suggestive of cauda equina.  CT shows some tiny gallstones, but without evidence of fever, vomiting and reassuring LFTs, do not feel this is the cause of patient's current symptoms. She states she is under the care of a neurosurgeon currently for her low back pain and she has pain medication and muscle relaxers at home which are not helping.  I offered prednisone  taper, but patient declined stating she has intolerance to prednisone .  Will have her try over-the-counter lidocaine  patches and she request prescription for different muscle relaxer.  I feel that she is appropriate for discharge home, patient also seen by Dr. Adan Holms and care plan discussed.    Risk Prescription drug management.           Final Clinical Impression(s) / ED Diagnoses Final diagnoses:  Right flank pain    Rx / DC Orders ED Discharge Orders     None         Mitzie Anda 12/29/23 1006    Ninetta Basket, MD 01/04/24 [phone]

## 2024-01-01 ENCOUNTER — Other Ambulatory Visit: Payer: Self-pay | Admitting: Internal Medicine

## 2024-01-01 DIAGNOSIS — N3281 Overactive bladder: Secondary | ICD-10-CM

## 2024-01-02 MED ORDER — OXYBUTYNIN CHLORIDE 5 MG PO TABS: ORAL_TABLET | ORAL | 0 refills | Status: DC

## 2024-01-06 ENCOUNTER — Other Ambulatory Visit: Payer: Self-pay | Admitting: Internal Medicine

## 2024-01-06 DIAGNOSIS — F419 Anxiety disorder, unspecified: Secondary | ICD-10-CM

## 2024-01-07 ENCOUNTER — Other Ambulatory Visit: Payer: Self-pay | Admitting: Internal Medicine

## 2024-01-07 DIAGNOSIS — F419 Anxiety disorder, unspecified: Secondary | ICD-10-CM

## 2024-01-07 MED ORDER — SERTRALINE HCL 100 MG PO TABS: 100.0000 mg | ORAL_TABLET | Freq: Every day | ORAL | 0 refills | Status: DC

## 2024-01-07 MED ORDER — BUSPIRONE HCL 7.5 MG PO TABS
7.5000 mg | ORAL_TABLET | Freq: Two times a day (BID) | ORAL | 0 refills | Status: DC
Start: 2024-01-07 — End: 2024-02-05

## 2024-01-08 ENCOUNTER — Ambulatory Visit: Payer: PPO | Admitting: Internal Medicine

## 2024-01-08 DIAGNOSIS — R233 Spontaneous ecchymoses: Secondary | ICD-10-CM

## 2024-01-08 DIAGNOSIS — M130 Polyarthritis, unspecified: Secondary | ICD-10-CM

## 2024-01-08 DIAGNOSIS — D869 Sarcoidosis, unspecified: Secondary | ICD-10-CM

## 2024-01-08 DIAGNOSIS — G5603 Carpal tunnel syndrome, bilateral upper limbs: Secondary | ICD-10-CM

## 2024-01-08 DIAGNOSIS — E1142 Type 2 diabetes mellitus with diabetic polyneuropathy: Secondary | ICD-10-CM

## 2024-01-14 ENCOUNTER — Other Ambulatory Visit: Payer: Self-pay | Admitting: Internal Medicine

## 2024-01-17 ENCOUNTER — Other Ambulatory Visit: Payer: Self-pay | Admitting: Internal Medicine

## 2024-01-31 DIAGNOSIS — E119 Type 2 diabetes mellitus without complications: Secondary | ICD-10-CM | POA: Diagnosis not present

## 2024-01-31 DIAGNOSIS — M961 Postlaminectomy syndrome, not elsewhere classified: Secondary | ICD-10-CM | POA: Diagnosis not present

## 2024-01-31 DIAGNOSIS — I10 Essential (primary) hypertension: Secondary | ICD-10-CM | POA: Diagnosis not present

## 2024-01-31 DIAGNOSIS — Z981 Arthrodesis status: Secondary | ICD-10-CM | POA: Diagnosis not present

## 2024-02-05 ENCOUNTER — Other Ambulatory Visit: Payer: Self-pay | Admitting: Internal Medicine

## 2024-02-05 DIAGNOSIS — F419 Anxiety disorder, unspecified: Secondary | ICD-10-CM

## 2024-02-10 ENCOUNTER — Ambulatory Visit (INDEPENDENT_AMBULATORY_CARE_PROVIDER_SITE_OTHER): Admitting: Internal Medicine

## 2024-02-10 ENCOUNTER — Encounter: Payer: Self-pay | Admitting: Internal Medicine

## 2024-02-10 VITALS — BP 134/79 | HR 112 | Ht 68.0 in | Wt 169.6 lb

## 2024-02-10 DIAGNOSIS — E118 Type 2 diabetes mellitus with unspecified complications: Secondary | ICD-10-CM | POA: Diagnosis not present

## 2024-02-10 DIAGNOSIS — I10 Essential (primary) hypertension: Secondary | ICD-10-CM | POA: Diagnosis not present

## 2024-02-10 DIAGNOSIS — Z7984 Long term (current) use of oral hypoglycemic drugs: Secondary | ICD-10-CM

## 2024-02-10 DIAGNOSIS — E876 Hypokalemia: Secondary | ICD-10-CM

## 2024-02-10 DIAGNOSIS — N1832 Chronic kidney disease, stage 3b: Secondary | ICD-10-CM

## 2024-02-10 DIAGNOSIS — R1319 Other dysphagia: Secondary | ICD-10-CM | POA: Diagnosis not present

## 2024-02-10 DIAGNOSIS — E559 Vitamin D deficiency, unspecified: Secondary | ICD-10-CM

## 2024-02-10 MED ORDER — POTASSIUM CHLORIDE CRYS ER 20 MEQ PO TBCR: 20.0000 meq | EXTENDED_RELEASE_TABLET | Freq: Two times a day (BID) | ORAL | 0 refills | Status: DC

## 2024-02-10 MED ORDER — VITAMIN D (ERGOCALCIFEROL) 1.25 MG (50000 UNIT) PO CAPS: 50000.0000 [IU] | ORAL_CAPSULE | ORAL | 1 refills | Status: DC

## 2024-02-10 NOTE — Progress Notes (Unsigned)
 Established Patient Office Visit  Subjective   Patient ID: [name], female    DOB: 07-16-1946  Age: 78 y.o. MRN: [id number]  Chief Complaint  Patient presents with   Care Management    Three month follow up    [name] returns to care today for routine follow-up.  She was last evaluated by me on 3/13.  No medication changes were made at that time, repeat labs ordered, and 102-month follow-up arranged for reassessment.  In the interim, she presented to the emergency department endorsing right sided flank pain.  Her workup was reassuring.  Felt to be musculoskeletal in etiology.  In the interim, she has been seen by pain management on multiple occasions and underwent spinal cord stimulator implantation parts 1 and 2 on 4/4 and 6/6 respectively.  Past Medical History:  Diagnosis Date   Arthritis    Coronary artery disease    Diabetes mellitus without complication (HCC)    Encephalopathy 03/02/2014   Hyperlipidemia    Hypertension    Osteoporosis    Referred otalgia of right ear 02/11/2017   Renal disorder    Sarcoidosis    Throat pain 03/03/2020   Past Surgical History:  Procedure Laterality Date   ANKLE SURGERY     BACK SURGERY     FRACTURE SURGERY     tibia    Social History   Tobacco Use   Smoking status: Never    Passive exposure: Past   Smokeless tobacco: Never  Vaping Use   Vaping status: Never Used  Substance Use Topics   Alcohol  use: No   Drug use: No   Family History  Problem Relation Age of Onset   Hypertension Mother    Hyperlipidemia Mother    Throat cancer Father    Early death Son    Allergies  Allergen Reactions   Darvon [Propoxyphene] Nausea And Vomiting   Morphine And Codeine Itching and Rash   Penicillins Itching and Rash    Pt denies this allergy   ROS    Objective:     BP 134/79   Pulse (!) 112   Ht 5' 8 (1.727 m)   Wt 169 lb 9.6 oz (76.9 kg)   SpO2 96%   BMI 25.79 kg/m  BP Readings from Last 3 Encounters:  02/10/24 134/79   12/27/23 136/88  11/07/23 125/79   Physical Exam  Last CBC Lab Results  Component Value Date   WBC 4.9 12/27/2023   HGB 13.1 12/27/2023   HCT 41.7 12/27/2023   MCV 89.3 12/27/2023   MCH 28.1 12/27/2023   RDW 17.2 (H) 12/27/2023   PLT 147 (L) 12/27/2023   Last metabolic panel Lab Results  Component Value Date   GLUCOSE 146 (H) 12/27/2023   NA 140 12/27/2023   K 3.4 (L) 12/27/2023   CL 106 12/27/2023   CO2 25 12/27/2023   BUN 20 12/27/2023   CREATININE 1.16 (H) 12/27/2023   GFRNONAA 49 (L) 12/27/2023   CALCIUM  9.2 12/27/2023   PHOS 2.8 02/28/2011   PROT 6.2 (L) 12/27/2023   ALBUMIN 3.3 (L) 12/27/2023   LABGLOB 2.6 11/07/2023   AGRATIO 1.7 12/26/2022   BILITOT 0.6 12/27/2023   ALKPHOS 33 (L) 12/27/2023   AST 18 12/27/2023   ALT 14 12/27/2023   ANIONGAP 9 12/27/2023   Last lipids Lab Results  Component Value Date   CHOL 180 12/26/2022   HDL 62 12/26/2022   LDLCALC 95 12/26/2022   TRIG 131 12/26/2022  CHOLHDL 2.9 12/26/2022   Last hemoglobin A1c Lab Results  Component Value Date   HGBA1C 6.5 (H) 11/07/2023   Last thyroid  functions Lab Results  Component Value Date   TSH 2.890 11/07/2023   Last vitamin D  Lab Results  Component Value Date   VD25OH 26.5 (L) 11/07/2023   Last vitamin B12 and Folate Lab Results  Component Value Date   VITAMINB12 404 11/07/2023   FOLATE 7.9 11/07/2023      The 10-year ASCVD risk score (Arnett DK, et al., 2019) is: 30.9%    Assessment & Plan:   Problem List Items Addressed This Visit   None   No follow-ups on file.    Tiffany Fortes, MD

## 2024-02-10 NOTE — Patient Instructions (Signed)
 It was a pleasure to see you today.  Thank you for giving us  the opportunity to be involved in your care.  Below is a brief recap of your visit and next steps.  We will plan to see you again in 3 months.  Summary Gastroenterology referral placed Potassium and vitamin D  refilled Check urine study Follow up in 3 months

## 2024-02-11 DIAGNOSIS — R1310 Dysphagia, unspecified: Secondary | ICD-10-CM | POA: Insufficient documentation

## 2024-02-11 NOTE — Assessment & Plan Note (Signed)
 Remains adequately controlled on current antihypertensive regimen.

## 2024-02-11 NOTE — Assessment & Plan Note (Signed)
 Renal function stable on labs from last month.  She remains on lisinopril  and Jardiance .

## 2024-02-11 NOTE — Assessment & Plan Note (Signed)
 A1c 6.5 on labs from March.  She remains on Jardiance  25 mg daily.  A repeat urine microalbumin/creatinine ratio ordered today.

## 2024-02-11 NOTE — Assessment & Plan Note (Signed)
 Her acute concern today is dysphagia with solid foods that has been present for at least the last month.  She describes difficulty swallowing solid foods, noting that they get stuck.  She tried drinking water but says that it does not help and she often ends up regurgitating the food bolus.  She is currently prescribed esomeprazole  for management of GERD.  Documented history of perforated jejunal ulcer around 2005. -Gastroenterology referral placed today

## 2024-02-12 DIAGNOSIS — M961 Postlaminectomy syndrome, not elsewhere classified: Secondary | ICD-10-CM | POA: Diagnosis not present

## 2024-02-12 DIAGNOSIS — G8929 Other chronic pain: Secondary | ICD-10-CM | POA: Diagnosis not present

## 2024-02-12 DIAGNOSIS — M545 Low back pain, unspecified: Secondary | ICD-10-CM | POA: Diagnosis not present

## 2024-02-13 ENCOUNTER — Encounter: Payer: Self-pay | Admitting: Gastroenterology

## 2024-02-25 ENCOUNTER — Other Ambulatory Visit: Payer: Self-pay | Admitting: Internal Medicine

## 2024-02-26 ENCOUNTER — Ambulatory Visit: Admitting: Gastroenterology

## 2024-03-02 ENCOUNTER — Other Ambulatory Visit: Payer: Self-pay

## 2024-03-02 DIAGNOSIS — I10 Essential (primary) hypertension: Secondary | ICD-10-CM

## 2024-03-05 ENCOUNTER — Other Ambulatory Visit: Payer: Self-pay | Admitting: Internal Medicine

## 2024-03-05 DIAGNOSIS — F419 Anxiety disorder, unspecified: Secondary | ICD-10-CM

## 2024-03-30 ENCOUNTER — Other Ambulatory Visit: Payer: Self-pay

## 2024-03-30 DIAGNOSIS — E876 Hypokalemia: Secondary | ICD-10-CM

## 2024-03-30 MED ORDER — POTASSIUM CHLORIDE CRYS ER 20 MEQ PO TBCR: 20.0000 meq | EXTENDED_RELEASE_TABLET | Freq: Two times a day (BID) | ORAL | 0 refills | Status: DC

## 2024-04-04 ENCOUNTER — Other Ambulatory Visit: Payer: Self-pay | Admitting: Internal Medicine

## 2024-04-04 DIAGNOSIS — E782 Mixed hyperlipidemia: Secondary | ICD-10-CM

## 2024-04-08 ENCOUNTER — Other Ambulatory Visit: Payer: Self-pay

## 2024-04-08 DIAGNOSIS — F419 Anxiety disorder, unspecified: Secondary | ICD-10-CM

## 2024-04-08 DIAGNOSIS — G894 Chronic pain syndrome: Secondary | ICD-10-CM | POA: Diagnosis not present

## 2024-04-08 DIAGNOSIS — G8929 Other chronic pain: Secondary | ICD-10-CM | POA: Diagnosis not present

## 2024-04-08 DIAGNOSIS — M47816 Spondylosis without myelopathy or radiculopathy, lumbar region: Secondary | ICD-10-CM | POA: Diagnosis not present

## 2024-04-08 DIAGNOSIS — M545 Low back pain, unspecified: Secondary | ICD-10-CM | POA: Diagnosis not present

## 2024-04-08 DIAGNOSIS — Z9689 Presence of other specified functional implants: Secondary | ICD-10-CM | POA: Diagnosis not present

## 2024-04-08 NOTE — Telephone Encounter (Signed)
 Copied from CRM #[id number]. Topic: Clinical - Medication Refill >> Apr 08, 2024  3:16 PM Donna E wrote: Medication:  busPIRone  (BUSPAR ) 7.5 MG tablet sertraline  (ZOLOFT ) 100 MG tablet  Has the patient contacted their pharmacy? Yes pharmacy is wanting for provider to send prescription in   This is the patient's preferred pharmacy:   [facility] [place], [place] - [id number] [place] [address] Phone: [phone] Fax: [phone]  Is this the correct pharmacy for this prescription? Yes If no, delete pharmacy and type the correct one.   Has the prescription been filled recently? Yes  Is the patient out of the medication? Yes  Has the patient been seen for an appointment in the last year OR does the patient have an upcoming appointment? Yes  Can we respond through MyChart? Yes  Agent: Please be advised that Rx refills may take up to 3 business days. We ask that you follow-up with your pharmacy.

## 2024-04-10 MED ORDER — BUSPIRONE HCL 7.5 MG PO TABS: 7.5000 mg | ORAL_TABLET | Freq: Two times a day (BID) | ORAL | 0 refills | Status: DC

## 2024-04-10 MED ORDER — SERTRALINE HCL 100 MG PO TABS: 100.0000 mg | ORAL_TABLET | Freq: Every day | ORAL | 0 refills | Status: DC

## 2024-04-13 ENCOUNTER — Other Ambulatory Visit: Payer: Self-pay

## 2024-04-14 ENCOUNTER — Other Ambulatory Visit: Payer: Self-pay | Admitting: Internal Medicine

## 2024-04-22 ENCOUNTER — Other Ambulatory Visit: Payer: Self-pay

## 2024-04-22 DIAGNOSIS — I10 Essential (primary) hypertension: Secondary | ICD-10-CM

## 2024-04-22 MED ORDER — LISINOPRIL 2.5 MG PO TABS: 2.5000 mg | ORAL_TABLET | Freq: Every day | ORAL | 1 refills | Status: AC

## 2024-04-23 ENCOUNTER — Other Ambulatory Visit: Payer: Self-pay | Admitting: Internal Medicine

## 2024-04-23 DIAGNOSIS — K219 Gastro-esophageal reflux disease without esophagitis: Secondary | ICD-10-CM

## 2024-05-01 ENCOUNTER — Other Ambulatory Visit: Payer: Self-pay

## 2024-05-12 ENCOUNTER — Ambulatory Visit (INDEPENDENT_AMBULATORY_CARE_PROVIDER_SITE_OTHER)

## 2024-05-12 DIAGNOSIS — Z20822 Contact with and (suspected) exposure to covid-19: Secondary | ICD-10-CM | POA: Diagnosis not present

## 2024-05-12 DIAGNOSIS — E118 Type 2 diabetes mellitus with unspecified complications: Secondary | ICD-10-CM

## 2024-05-12 DIAGNOSIS — E782 Mixed hyperlipidemia: Secondary | ICD-10-CM | POA: Diagnosis not present

## 2024-05-12 DIAGNOSIS — F419 Anxiety disorder, unspecified: Secondary | ICD-10-CM | POA: Diagnosis not present

## 2024-05-12 MED ORDER — NIRMATRELVIR/RITONAVIR (PAXLOVID) TABLET (RENAL DOSING): 2.0000 | ORAL_TABLET | Freq: Two times a day (BID) | ORAL | 0 refills | Status: AC

## 2024-05-12 MED ORDER — BUSPIRONE HCL 7.5 MG PO TABS: 7.5000 mg | ORAL_TABLET | Freq: Two times a day (BID) | ORAL | 5 refills | Status: AC

## 2024-05-12 NOTE — Progress Notes (Unsigned)
   Established Patient Office Visit  Subjective   Patient ID: [name], female    DOB: 11/25/1945  Age: 78 y.o. MRN: [id number]  Chief Complaint  Patient presents with   Medical Management of Chronic Issues    3 month follow up     HPI  Patient Active Problem List   Diagnosis Date Noted   Dysphagia 02/11/2024   Need for influenza vaccination 12/04/2023   Bilateral carpal tunnel syndrome 07/08/2023   Easy bruising 07/08/2023   Colon cancer screening 12/26/2022   Tinnitus 10/26/2022   Viral illness 09/05/2022   Encounter for general adult medical examination with abnormal findings 07/04/2022   CKD (chronic kidney disease) stage 3, GFR 30-59 ml/min (HCC) 12/12/2021   Vitamin D  deficiency 12/12/2021   Grief 09/05/2021   Insomnia 08/12/2021   Need for immunization against influenza 08/11/2021   Overactive bladder 10/11/2020   Anxiety and depression 09/13/2020   Gastroesophageal reflux disease 03/03/2020   Laryngopharyngeal reflux (LPR) 03/03/2020   Depression, major, single episode, moderate (HCC) 12/02/2019   Controlled diabetes mellitus type 2 with complications (HCC) 12/02/2019   Hyperlipidemia 12/02/2019   Essential hypertension 12/02/2019   Lumbar radiculopathy 10/31/2019   Polyarthropathy 10/31/2019   Polyneuropathy due to type 2 diabetes mellitus (HCC) 10/31/2019   Chronic pain syndrome 10/31/2019   Bilateral sensorineural hearing loss 02/11/2017   Right ear impacted cerumen 02/11/2017   Sarcoidosis 03/02/2014   Low back pain 03/02/2014      ROS    Objective:     There were no vitals taken for this visit. BP Readings from Last 3 Encounters:  02/10/24 134/79  12/27/23 136/88  11/07/23 125/79   Wt Readings from Last 3 Encounters:  02/10/24 169 lb 9.6 oz (76.9 kg)  12/27/23 165 lb (74.8 kg)  11/07/23 171 lb (77.6 kg)     Physical Exam   No results found for any visits on 05/12/24.  {Labs (Optional):23779}  The 10-year ASCVD risk score  (Arnett DK, et al., 2019) is: 25.1%    Assessment & Plan:   Problem List Items Addressed This Visit   None   No follow-ups on file.    Leita Longs, FNP

## 2024-05-13 DIAGNOSIS — Z79899 Other long term (current) drug therapy: Secondary | ICD-10-CM | POA: Diagnosis not present

## 2024-05-13 DIAGNOSIS — D869 Sarcoidosis, unspecified: Secondary | ICD-10-CM | POA: Diagnosis not present

## 2024-05-13 DIAGNOSIS — M069 Rheumatoid arthritis, unspecified: Secondary | ICD-10-CM | POA: Diagnosis not present

## 2024-05-13 DIAGNOSIS — H40023 Open angle with borderline findings, high risk, bilateral: Secondary | ICD-10-CM | POA: Diagnosis not present

## 2024-05-13 DIAGNOSIS — Z961 Presence of intraocular lens: Secondary | ICD-10-CM | POA: Diagnosis not present

## 2024-05-13 DIAGNOSIS — H04123 Dry eye syndrome of bilateral lacrimal glands: Secondary | ICD-10-CM | POA: Diagnosis not present

## 2024-05-13 LAB — HM DIABETES EYE EXAM

## 2024-05-15 NOTE — Assessment & Plan Note (Signed)
 Mood remains stable and anxiety adequately controlled with sertraline  100 mg daily and BuSpar .

## 2024-05-15 NOTE — Assessment & Plan Note (Signed)
 A1c 6.5 on labs from March.  She remains on Jardiance  25 mg daily. Repeat labs and urine microalbumin/creatinine ratio ordered today.

## 2024-05-15 NOTE — Assessment & Plan Note (Signed)
 Recheck fasting labs.  She is currently managed with and atorvastatin  40 mg daily ezetimibe  10 mg daily.

## 2024-05-22 DIAGNOSIS — G894 Chronic pain syndrome: Secondary | ICD-10-CM | POA: Diagnosis not present

## 2024-05-22 DIAGNOSIS — M961 Postlaminectomy syndrome, not elsewhere classified: Secondary | ICD-10-CM | POA: Diagnosis not present

## 2024-05-22 DIAGNOSIS — M47816 Spondylosis without myelopathy or radiculopathy, lumbar region: Secondary | ICD-10-CM | POA: Diagnosis not present

## 2024-05-22 DIAGNOSIS — M545 Low back pain, unspecified: Secondary | ICD-10-CM | POA: Diagnosis not present

## 2024-05-22 DIAGNOSIS — G8929 Other chronic pain: Secondary | ICD-10-CM | POA: Diagnosis not present

## 2024-05-27 DIAGNOSIS — E118 Type 2 diabetes mellitus with unspecified complications: Secondary | ICD-10-CM | POA: Diagnosis not present

## 2024-05-27 DIAGNOSIS — E782 Mixed hyperlipidemia: Secondary | ICD-10-CM | POA: Diagnosis not present

## 2024-05-29 LAB — MICROALBUMIN / CREATININE URINE RATIO
Creatinine, Urine: 43.1 mg/dL
Microalb/Creat Ratio: 106 mg/g{creat} — AB (ref 0–29)
Microalbumin, Urine: 45.7 ug/mL

## 2024-05-29 LAB — CMP14+EGFR
ALT: 14 IU/L (ref 0–32)
AST: 21 IU/L (ref 0–40)
Albumin: 4.4 g/dL (ref 3.8–4.8)
Alkaline Phosphatase: 58 IU/L (ref 49–135)
BUN/Creatinine Ratio: 13 (ref 12–28)
BUN: 13 mg/dL (ref 8–27)
Bilirubin Total: 0.4 mg/dL (ref 0.0–1.2)
CO2: 21 mmol/L (ref 20–29)
Calcium: 9.6 mg/dL (ref 8.7–10.3)
Chloride: 102 mmol/L (ref 96–106)
Creatinine, Ser: 1.01 mg/dL — ABNORMAL HIGH (ref 0.57–1.00)
Globulin, Total: 2.7 g/dL (ref 1.5–4.5)
Glucose: 201 mg/dL — ABNORMAL HIGH (ref 70–99)
Potassium: 4.3 mmol/L (ref 3.5–5.2)
Sodium: 142 mmol/L (ref 134–144)
Total Protein: 7.1 g/dL (ref 6.0–8.5)
eGFR: 58 mL/min/1.73 — ABNORMAL LOW (ref >59)

## 2024-05-29 LAB — LIPID PANEL
Chol/HDL Ratio: 2.7 ratio (ref 0.0–4.4)
Cholesterol, Total: 173 mg/dL (ref 100–199)
HDL: 64 mg/dL (ref >39)
LDL Chol Calc (NIH): 83 mg/dL (ref 0–99)
Triglycerides: 155 mg/dL — ABNORMAL HIGH (ref 0–149)
VLDL Cholesterol Cal: 26 mg/dL (ref 5–40)

## 2024-05-29 LAB — HEMOGLOBIN A1C
Est. average glucose Bld gHb Est-mCnc: 140 mg/dL
Hgb A1c MFr Bld: 6.5 % — ABNORMAL HIGH (ref 4.8–5.6)

## 2024-06-02 ENCOUNTER — Other Ambulatory Visit: Payer: Self-pay

## 2024-06-02 DIAGNOSIS — I10 Essential (primary) hypertension: Secondary | ICD-10-CM

## 2024-06-02 MED ORDER — FUROSEMIDE 40 MG PO TABS: 40.0000 mg | ORAL_TABLET | Freq: Every day | ORAL | 1 refills | Status: AC

## 2024-06-08 DIAGNOSIS — M47816 Spondylosis without myelopathy or radiculopathy, lumbar region: Secondary | ICD-10-CM | POA: Diagnosis not present

## 2024-06-22 DIAGNOSIS — M47816 Spondylosis without myelopathy or radiculopathy, lumbar region: Secondary | ICD-10-CM | POA: Diagnosis not present

## 2024-06-22 DIAGNOSIS — E118 Type 2 diabetes mellitus with unspecified complications: Secondary | ICD-10-CM | POA: Diagnosis not present

## 2024-06-24 ENCOUNTER — Other Ambulatory Visit (HOSPITAL_COMMUNITY): Payer: Self-pay

## 2024-06-24 DIAGNOSIS — Z1231 Encounter for screening mammogram for malignant neoplasm of breast: Secondary | ICD-10-CM

## 2024-06-29 ENCOUNTER — Other Ambulatory Visit: Payer: Self-pay

## 2024-06-29 DIAGNOSIS — E118 Type 2 diabetes mellitus with unspecified complications: Secondary | ICD-10-CM

## 2024-06-29 MED ORDER — EMPAGLIFLOZIN 25 MG PO TABS: 25.0000 mg | ORAL_TABLET | Freq: Every day | ORAL | 11 refills | Status: AC

## 2024-07-02 DIAGNOSIS — D869 Sarcoidosis, unspecified: Secondary | ICD-10-CM | POA: Diagnosis not present

## 2024-07-02 DIAGNOSIS — H04123 Dry eye syndrome of bilateral lacrimal glands: Secondary | ICD-10-CM | POA: Diagnosis not present

## 2024-07-02 DIAGNOSIS — H40023 Open angle with borderline findings, high risk, bilateral: Secondary | ICD-10-CM | POA: Diagnosis not present

## 2024-07-06 ENCOUNTER — Other Ambulatory Visit: Payer: Self-pay

## 2024-07-06 DIAGNOSIS — N3281 Overactive bladder: Secondary | ICD-10-CM

## 2024-07-09 ENCOUNTER — Other Ambulatory Visit: Payer: Self-pay

## 2024-07-09 MED ORDER — SERTRALINE HCL 100 MG PO TABS: 100.0000 mg | ORAL_TABLET | Freq: Every day | ORAL | 0 refills | Status: AC

## 2024-07-09 NOTE — Telephone Encounter (Signed)
 Copied from CRM #[id number]. Topic: Clinical - Medication Refill >> Jul 09, 2024  3:05 PM Delon HERO wrote: Medication: sertraline  (ZOLOFT ) 100 MG tablet [[id number]]  Has the patient contacted their pharmacy? Yes (Agent: If no, request that the patient contact the pharmacy for the refill. If patient does not wish to contact the pharmacy document the reason why and proceed with request.) (Agent: If yes, when and what did the pharmacy advise?)  This is the patient's preferred pharmacy:  [facility] [place], [place] - [id number] [place] [address] Phone: [phone] Fax: [phone]  Is this the correct pharmacy for this prescription? Yes If no, delete pharmacy and type the correct one.   Has the prescription been filled recently? Yes  Is the patient out of the medication? Yes  Has the patient been seen for an appointment in the last year OR does the patient have an upcoming appointment? Yes  Can we respond through MyChart? Yes  Agent: Please be advised that Rx refills may take up to 3 business days. We ask that you follow-up with your pharmacy.

## 2024-07-10 ENCOUNTER — Other Ambulatory Visit: Payer: Self-pay

## 2024-07-17 ENCOUNTER — Telehealth: Payer: Self-pay

## 2024-07-17 NOTE — Telephone Encounter (Signed)
 Copied from CRM [id number]. Topic: Clinical - Medication Question >> Jul 17, 2024  3:33 PM Santiya F wrote: Reason for CRM: Patient is calling in because she wants to know if she is supposed to be taking ezetimibe  (ZETIA ) 10 MG tablet [[id number]]. Patient says the pharmacy has sent requests over to the pharmacy and they haven't heard back, and patient wants to know is it because her provider wants her to discontinue the medication. Please advise.

## 2024-07-17 NOTE — Telephone Encounter (Signed)
 Fax has been sent to pharmacy.

## 2024-07-19 ENCOUNTER — Other Ambulatory Visit: Payer: Self-pay

## 2024-07-19 DIAGNOSIS — E782 Mixed hyperlipidemia: Secondary | ICD-10-CM

## 2024-07-19 DIAGNOSIS — E559 Vitamin D deficiency, unspecified: Secondary | ICD-10-CM

## 2024-07-19 DIAGNOSIS — N3281 Overactive bladder: Secondary | ICD-10-CM

## 2024-07-19 MED ORDER — EZETIMIBE 10 MG PO TABS: 10.0000 mg | ORAL_TABLET | Freq: Every day | ORAL | 3 refills | Status: AC

## 2024-07-19 MED ORDER — VITAMIN D (ERGOCALCIFEROL) 1.25 MG (50000 UNIT) PO CAPS: 50000.0000 [IU] | ORAL_CAPSULE | ORAL | 3 refills | Status: AC

## 2024-07-26 ENCOUNTER — Ambulatory Visit: Payer: Self-pay

## 2024-08-13 ENCOUNTER — Other Ambulatory Visit: Payer: Self-pay

## 2024-08-13 DIAGNOSIS — E876 Hypokalemia: Secondary | ICD-10-CM

## 2024-09-02 ENCOUNTER — Ambulatory Visit

## 2024-09-02 VITALS — BP 139/81 | HR 99 | Ht 68.0 in | Wt 175.0 lb

## 2024-09-02 DIAGNOSIS — I10 Essential (primary) hypertension: Secondary | ICD-10-CM

## 2024-09-02 DIAGNOSIS — N189 Chronic kidney disease, unspecified: Secondary | ICD-10-CM

## 2024-09-02 DIAGNOSIS — E1122 Type 2 diabetes mellitus with diabetic chronic kidney disease: Secondary | ICD-10-CM

## 2024-09-02 DIAGNOSIS — Z7984 Long term (current) use of oral hypoglycemic drugs: Secondary | ICD-10-CM

## 2024-09-02 DIAGNOSIS — E118 Type 2 diabetes mellitus with unspecified complications: Secondary | ICD-10-CM

## 2024-09-02 DIAGNOSIS — N1832 Chronic kidney disease, stage 3b: Secondary | ICD-10-CM

## 2024-09-02 NOTE — Progress Notes (Signed)
 "  Established Patient Office Visit  Subjective   Patient ID: [name], female    DOB: 07/30/1946  Age: 79 y.o. MRN: [id number]  Chief Complaint  Patient presents with   Medical Management of Chronic Issues    Follow up     HPI Discussed the use of AI scribe software for clinical note transcription with the patient, who gave verbal consent to proceed.  History of Present Illness    [name] is a 79 year old female who presents with issues regarding her spinal stimulator.  Spinal stimulator malfunction - Spinal stimulator implanted for management of back pain. - Stimulator is currently not functioning. - Attempts to adjust the device during visits have been unsuccessful. - Uses a remote to manage stimulator settings during appointments. - At today's spine appointment, usual provider for stimulator adjustments was unavailable and did not respond to messages.  Chronic back pain - Persistent back pain despite spinal stimulator therapy. - Currently taking oxycodone  for pain management. - Initial oxycodone  dose was 5 mg; increased to 7 mg due to inadequate pain relief.  Bowel dysfunction - Irregular bowel movements. - Uses daily stool softeners. - Uses laxatives as needed, particularly when constipated. - Attempts to increase water intake, but finds it challenging, especially during winter months.     Patient Active Problem List   Diagnosis Date Noted   Dysphagia 02/11/2024   Need for influenza vaccination 12/04/2023   Bilateral carpal tunnel syndrome 07/08/2023   Easy bruising 07/08/2023   Colon cancer screening 12/26/2022   Tinnitus 10/26/2022   Viral illness 09/05/2022   Encounter for general adult medical examination with abnormal findings 07/04/2022   CKD (chronic kidney disease) stage 3, GFR 30-59 ml/min (HCC) 12/12/2021   Vitamin D  deficiency 12/12/2021   Grief 09/05/2021   Insomnia 08/12/2021   Need for immunization against influenza 08/11/2021    Overactive bladder 10/11/2020   Anxiety 09/13/2020   Gastroesophageal reflux disease 03/03/2020   Laryngopharyngeal reflux (LPR) 03/03/2020   Depression, major, single episode, moderate (HCC) 12/02/2019   Controlled diabetes mellitus type 2 with complications (HCC) 12/02/2019   Hyperlipidemia 12/02/2019   Essential hypertension 12/02/2019   Lumbar radiculopathy 10/31/2019   Polyarthropathy 10/31/2019   Polyneuropathy due to type 2 diabetes mellitus (HCC) 10/31/2019   Chronic pain syndrome 10/31/2019   Bilateral sensorineural hearing loss 02/11/2017   Right ear impacted cerumen 02/11/2017   Sarcoidosis 03/02/2014   Low back pain 03/02/2014      ROS    Objective:     BP 139/81   Pulse 99   Ht 5' 8 (1.727 m)   Wt 175 lb (79.4 kg)   SpO2 96%   BMI 26.61 kg/m  BP Readings from Last 3 Encounters:  09/02/24 139/81  02/10/24 134/79  12/27/23 136/88   Wt Readings from Last 3 Encounters:  09/02/24 175 lb (79.4 kg)  02/10/24 169 lb 9.6 oz (76.9 kg)  12/27/23 165 lb (74.8 kg)     Physical Exam Vitals and nursing note reviewed.  Constitutional:      Appearance: Normal appearance.  HENT:     Head: Normocephalic.  Eyes:     Extraocular Movements: Extraocular movements intact.     Pupils: Pupils are equal, round, and reactive to light.  Cardiovascular:     Rate and Rhythm: Normal rate and regular rhythm.  Pulmonary:     Effort: Pulmonary effort is normal.     Breath sounds: Normal breath sounds.  Musculoskeletal:  Cervical back: Normal range of motion and neck supple.     Lumbar back: Tenderness present. Decreased range of motion.  Neurological:     Mental Status: She is alert and oriented to person, place, and time.  Psychiatric:        Mood and Affect: Mood normal.        Thought Content: Thought content normal.      No results found for any visits on 09/02/24.    The 10-year ASCVD risk score (Arnett DK, et al., 2019) is: 35.7%    Assessment & Plan:    Problem List Items Addressed This Visit       Cardiovascular and Mediastinum   Essential hypertension   Remains adequately controlled on current antihypertensive regimen.        Endocrine   Controlled diabetes mellitus type 2 with complications (HCC) - Primary   A1c 6.5 on labs from October.  She remains on Jardiance  25 mg daily. Repeat labs and urine microalbumin/creatinine ratio ordered today.         Genitourinary   CKD (chronic kidney disease) stage 3, GFR 30-59 ml/min (HCC)   Renal function stable on labs from last month.  She remains on lisinopril  and Jardiance .         Return in about 4 months (around 12/31/2024) for chronic follow-up with PCP.    Leita Longs, FNP  "

## 2024-09-06 NOTE — Assessment & Plan Note (Signed)
 A1c 6.5 on labs from October.  She remains on Jardiance  25 mg daily. Repeat labs and urine microalbumin/creatinine ratio ordered today.

## 2024-09-06 NOTE — Assessment & Plan Note (Signed)
 Remains adequately controlled on current antihypertensive regimen.

## 2024-09-06 NOTE — Assessment & Plan Note (Signed)
 Renal function stable on labs from last month.  She remains on lisinopril  and Jardiance .

## 2025-01-05 ENCOUNTER — Ambulatory Visit: Payer: Self-pay
